# Patient Record
Sex: Male | Born: 1960 | Race: Black or African American | Hispanic: No | Marital: Single | Smoking: Former smoker
Health system: Southern US, Community
[De-identification: ages and names within clinical notes are randomized; demographics above are authoritative.]

## PROBLEM LIST (undated history)

## (undated) DIAGNOSIS — E119 Type 2 diabetes mellitus without complications: Secondary | ICD-10-CM

## (undated) DIAGNOSIS — C189 Malignant neoplasm of colon, unspecified: Secondary | ICD-10-CM

## (undated) DIAGNOSIS — K219 Gastro-esophageal reflux disease without esophagitis: Secondary | ICD-10-CM

## (undated) DIAGNOSIS — C801 Malignant (primary) neoplasm, unspecified: Secondary | ICD-10-CM

## (undated) DIAGNOSIS — I10 Essential (primary) hypertension: Secondary | ICD-10-CM

## (undated) HISTORY — PX: COLONOSCOPY: SHX174

## (undated) HISTORY — DX: Essential (primary) hypertension: I10

## (undated) HISTORY — DX: Type 2 diabetes mellitus without complications: E11.9

---

## 2014-03-04 ENCOUNTER — Emergency Department (HOSPITAL_COMMUNITY): Payer: Self-pay

## 2014-03-04 ENCOUNTER — Encounter (HOSPITAL_COMMUNITY): Payer: Self-pay | Admitting: Emergency Medicine

## 2014-03-04 ENCOUNTER — Emergency Department (HOSPITAL_COMMUNITY)
Admission: EM | Admit: 2014-03-04 | Discharge: 2014-03-04 | Disposition: A | Discharge status: Home / Self Care | Payer: Self-pay | Attending: Emergency Medicine | Admitting: Emergency Medicine

## 2014-03-04 DIAGNOSIS — F17200 Nicotine dependence, unspecified, uncomplicated: Secondary | ICD-10-CM | POA: Insufficient documentation

## 2014-03-04 DIAGNOSIS — K047 Periapical abscess without sinus: Secondary | ICD-10-CM | POA: Insufficient documentation

## 2014-03-04 LAB — I-STAT CHEM 8, ED
BUN: 11 mg/dL (ref 6–23)
CALCIUM ION: 1.15 mmol/L (ref 1.12–1.23)
CHLORIDE: 103 meq/L (ref 96–112)
Creatinine, Ser: 1.2 mg/dL (ref 0.50–1.35)
GLUCOSE: 123 mg/dL — AB (ref 70–99)
HCT: 48 % (ref 39.0–52.0)
Hemoglobin: 16.3 g/dL (ref 13.0–17.0)
Potassium: 3.5 mEq/L — ABNORMAL LOW (ref 3.7–5.3)
Sodium: 139 mEq/L (ref 137–147)
TCO2: 22 mmol/L (ref 0–100)

## 2014-03-04 LAB — CBC WITH DIFFERENTIAL/PLATELET
Basophils Absolute: 0 10*3/uL (ref 0.0–0.1)
Basophils Relative: 0 % (ref 0–1)
Eosinophils Absolute: 0.1 10*3/uL (ref 0.0–0.7)
Eosinophils Relative: 1 % (ref 0–5)
HEMATOCRIT: 42.2 % (ref 39.0–52.0)
Hemoglobin: 15.3 g/dL (ref 13.0–17.0)
LYMPHS PCT: 24 % (ref 12–46)
Lymphs Abs: 2.1 10*3/uL (ref 0.7–4.0)
MCH: 31.2 pg (ref 26.0–34.0)
MCHC: 36.3 g/dL — ABNORMAL HIGH (ref 30.0–36.0)
MCV: 86.1 fL (ref 78.0–100.0)
MONO ABS: 0.7 10*3/uL (ref 0.1–1.0)
Monocytes Relative: 8 % (ref 3–12)
NEUTROS ABS: 6 10*3/uL (ref 1.7–7.7)
Neutrophils Relative %: 67 % (ref 43–77)
Platelets: 165 10*3/uL (ref 150–400)
RBC: 4.9 MIL/uL (ref 4.22–5.81)
RDW: 12.8 % (ref 11.5–15.5)
WBC: 9 10*3/uL (ref 4.0–10.5)

## 2014-03-04 MED ORDER — CLINDAMYCIN HCL 150 MG PO CAPS: 300.0000 mg | ORAL_CAPSULE | Freq: Three times a day (TID) | ORAL | Status: DC

## 2014-03-04 MED ORDER — IOHEXOL 300 MG/ML  SOLN
100.0000 mL | Freq: Once | INTRAMUSCULAR | Status: AC | PRN
  Administered 2014-03-04: 100 mL via INTRAVENOUS

## 2014-03-04 MED ORDER — MORPHINE SULFATE 4 MG/ML IJ SOLN
4.0000 mg | Freq: Once | INTRAMUSCULAR | Status: AC
  Administered 2014-03-04: 4 mg via INTRAVENOUS
  Filled 2014-03-04: qty 1

## 2014-03-04 MED ORDER — CLINDAMYCIN PHOSPHATE 300 MG/50ML IV SOLN
300.0000 mg | Freq: Once | INTRAVENOUS | Status: AC
  Administered 2014-03-04: 300 mg via INTRAVENOUS
  Filled 2014-03-04: qty 50

## 2014-03-04 MED ORDER — ONDANSETRON HCL 4 MG/2ML IJ SOLN
4.0000 mg | Freq: Once | INTRAMUSCULAR | Status: AC
  Administered 2014-03-04: 4 mg via INTRAVENOUS
  Filled 2014-03-04: qty 2

## 2014-03-04 MED ORDER — HYDROMORPHONE HCL PF 1 MG/ML IJ SOLN
1.0000 mg | Freq: Once | INTRAMUSCULAR | Status: AC
Start: 2014-03-04 — End: 2014-03-04
  Administered 2014-03-04: 1 mg via INTRAVENOUS
  Filled 2014-03-04: qty 1

## 2014-03-04 NOTE — Discharge Instructions (Signed)
°  Dental Abscess °A dental abscess is a collection of infected fluid (pus) from a bacterial infection in the inner part of the tooth (pulp). It usually occurs at the end of the tooth's root.  °CAUSES  °· Severe tooth decay. °· Trauma to the tooth that allows bacteria to enter into the pulp, such as a broken or chipped tooth. °SYMPTOMS  °· Severe pain in and around the infected tooth. °· Swelling and redness around the abscessed tooth or in the mouth or face. °· Tenderness. °· Pus drainage. °· Bad breath. °· Bitter taste in the mouth. °· Difficulty swallowing. °· Difficulty opening the mouth. °· Nausea. °· Vomiting. °· Chills. °· Swollen neck glands. °DIAGNOSIS  °· A medical and dental history will be taken. °· An examination will be performed by tapping on the abscessed tooth. °· X-rays may be taken of the tooth to identify the abscess. °TREATMENT °The goal of treatment is to eliminate the infection. You may be prescribed antibiotic medicine to stop the infection from spreading. A root canal may be performed to save the tooth. If the tooth cannot be saved, it may be pulled (extracted) and the abscess may be drained.  °HOME CARE INSTRUCTIONS °· Only take over-the-counter or prescription medicines for pain, fever, or discomfort as directed by your caregiver. °· Rinse your mouth (gargle) often with salt water (¼ tsp salt in 8 oz [250 ml] of warm water) to relieve pain or swelling. °· Do not drive after taking pain medicine (narcotics). °· Do not apply heat to the outside of your face. °· Return to your dentist for further treatment as directed. °SEEK MEDICAL CARE IF: °· Your pain is not helped by medicine. °· Your pain is getting worse instead of better. °SEEK IMMEDIATE MEDICAL CARE IF: °· You have a fever or persistent symptoms for more than 2 3 days. °· You have a fever and your symptoms suddenly get worse. °· You have chills or a very bad headache. °· You have problems breathing or swallowing. °· You have trouble  opening your mouth. °· You have swelling in the neck or around the eye. °Document Released: 10/18/2005 Document Revised: 07/12/2012 Document Reviewed: 01/26/2011 °[employer]® Patient Information ©2014 [employer]. ° ° °

## 2014-03-04 NOTE — ED Provider Notes (Signed)
CSN: [id number]     Arrival date & time 03/04/14  1439 History  This chart was scribed for non-physician practitioner, Junious SilkHannah , PA-C working with Shon Batonourtney F Horton, MD by Greggory StallionKayla Andersen, ED scribe. This patient was seen in room WTR7/WTR7 and the patient's care was started at 3:50 PM.   Chief Complaint  Patient presents with  . Dental Pain  . Facial Swelling   The history is provided by the patient. No language interpreter was used.   HPI Comments: Duane [name] is a 53 y.o. male who presents to the Emergency Department complaining of gradual onset, left lower dental pain that started 2 days ago. The pain is aching in nature. Left sided facial swelling started this morning. He states he has not noticed any drainage from the area. Pt has done warm compresses with no relief. Denies fever, chills, trouble swallowing, difficulty breathing. Denies history of diabetes.   History reviewed. No pertinent past medical history. History reviewed. No pertinent past surgical history. No family history on file. History  Substance Use Topics  . Smoking status: Current Every Day Smoker  . Smokeless tobacco: Never Used  . Alcohol Use: Yes     Comment: 3x/week    Review of Systems  Constitutional: Negative for fever and chills.  HENT: Positive for dental problem and facial swelling. Negative for trouble swallowing.   All other systems reviewed and are negative.  Allergies  Codeine  Home Medications   Prior to Admission medications   Not on File   BP 154/87  Pulse 62  Temp(Src) 98.2 F (36.8 C) (Oral)  Resp 16  SpO2 100%  Physical Exam  Nursing note and vitals reviewed. Constitutional: He is oriented to person, place, and time. He appears well-developed and well-nourished. No distress.  HENT:  Head: Normocephalic and atraumatic.  Right Ear: External ear normal.  Left Ear: External ear normal.  Nose: Nose normal.  No trismus or submental edema. 3 cm area of induration to left  lower cheek. No overlying cellulitis. No active drainage.   Eyes: Conjunctivae are normal.  Neck: Normal range of motion. No tracheal deviation present.  Cardiovascular: Normal rate, regular rhythm and normal heart sounds.   Pulmonary/Chest: Effort normal and breath sounds normal. No stridor.  Abdominal: Soft. He exhibits no distension. There is no tenderness.  Musculoskeletal: Normal range of motion.  Neurological: He is alert and oriented to person, place, and time.  Skin: Skin is warm and dry. He is not diaphoretic.  Psychiatric: He has a normal mood and affect. His behavior is normal.    ED Course  Procedures (including critical care time)  DIAGNOSTIC STUDIES: Oxygen Saturation is 99% on RA, normal by my interpretation.    COORDINATION OF CARE: 3:51 PM-Discussed treatment plan which includes speaking with Dr. Wilkie AyeHorton with pt at bedside and pt agreed to plan.   3:53 PM-Dr. Wilkie AyeHorton saw and evaluated pt. Advised to do CT scan of pt.  Labs Review Labs Reviewed  CBC WITH DIFFERENTIAL - Abnormal; Notable for the following:    MCHC 36.3 (*)    All other components within normal limits  I-STAT CHEM 8, ED - Abnormal; Notable for the following:    Potassium 3.5 (*)    Glucose, Bld 123 (*)    All other components within normal limits    Imaging Review Ct Soft Tissue Neck W Contrast  03/04/2014   CLINICAL DATA:  Left-sided drop pain and swelling. No fever or drainage.  EXAM: CT NECK WITH  CONTRAST  TECHNIQUE: Multidetector CT imaging of the neck was performed using the standard protocol following the bolus administration of intravenous contrast.  CONTRAST:  100mL OMNIPAQUE IOHEXOL 300 MG/ML  SOLN  COMPARISON:  None.  FINDINGS: A prominent dental caries is evident within the first residual molar in the left mandible. There is significant periapical lucency with lateral cortical destruction. A subperiosteal abscess measures 8 x 17 mm. There stranding into the adjacent soft tissues and platysma  muscle with additional low-density areas measuring up to 15 x 19 mm. Asymmetric enlarged left level 1B lymph nodes are present. The jugulodigastric lymph nodes are prominent on the left.  Salivary glands are within normal limits. Calcifications are present with concretions of the palatine tonsils.  No focal mucosal or submucosal lesions are present. The thyroid gland is within normal limits.  The lung apices are clear.  IMPRESSION: 1. Dental caries and periapical disease with lateral cortical breakthrough in the left mandible compatible with odontogenic abscess. 2. Subperiosteal abscess along the left mandible. 3. Extensive left facial swelling with additional areas of hypoattenuation measuring up to 15 x 19 mm, concerning for an adjacent abscess. 4. Reactive left-sided cervical adenopathy.   Electronically Signed   By: Gennette Pachris  Mattern M.D.   On: 03/04/2014 17:24     EKG Interpretation None      MDM   Final diagnoses:  Dental abscess   Patient presents to ED with odontogenic abscess, subperiosteal abscess, and additional abscess in jaw as seen by CT scan. Patient is maintaining secretions well and speaking in full sentences. No trismus and patient is afebrile. WBC count is 9.0. Discussed this case with Dr. Russella DarBenitez who recommends IV clindamycin and then follow up in office tomorrow for appropriate drainage. I discussed this with the patient who agrees with plan. He agrees to see Dr. Russella DarBenitez tomorrow. Discharged home with rx for clindamycin and pain medication. Discussed reasons to return to the ED immediately. Vital signs stable for discharge. Dr. Wilkie AyeHorton evaluated the patient and agrees with plan. Patient / Family / Caregiver informed of clinical course, understand medical decision-making process, and agree with plan.   I personally performed the services described in this documentation, which was scribed in my presence. The recorded information has been reviewed and is accurate.  Mora BellmanHannah S ,  PA-C 03/05/14 1620

## 2014-03-04 NOTE — ED Notes (Addendum)
Pt A+Ox4, reports lower dental pain x2 days, this AM awoke with swelling to jaw.  Significant swelling noted at this time to L lower cheek/mandible area.  Pt reports 8/10 pain.  Pt speaking full/clear sentences, rr even/un-lab.  No difficulty clearing secretions or maintaining airway.  Pt reports similar episode x2 months ago "and it drained on it's own".  Pt denies fevers/chills or other complaints.  Skin otherwise PWD.  Ambulatory with steady gait.  Pt reports taking ibuprofen around 1000 today with mild relief of pain.  NAD.

## 2014-03-06 NOTE — ED Provider Notes (Signed)
Medical screening examination/treatment/procedure(s) were conducted as a shared visit with non-physician practitioner(s) and myself.  I personally evaluated the patient during the encounter.   EKG Interpretation None     Patient presents with left lower dental pain and swelling.  Swelling over the left lower mandible and no obvious periapical abscess.  No overlying cellulitis.  Afebrile.  CT with multiple abscesses including periapically and adjacent to the mandible involving the cortex.  GIven clindamycin.  ENT consulted.   Shon Batonourtney F , MD 03/06/14 [phone]

## 2015-08-22 IMAGING — CT CT NECK W/ CM
3 of 4 series · 13 of 27 positions shown, 16 images · IV contrast (OMNIPAQUE 300)
Comparison: None.

CLINICAL DATA: Left-sided drop pain and swelling. No fever or
drainage.

EXAM:
CT NECK WITH CONTRAST
TECHNIQUE: Multidetector CT imaging of the neck was performed using the
standard protocol following the bolus administration of intravenous
contrast.
CONTRAST:  100mL OMNIPAQUE IOHEXOL 300 MG/ML  SOLN

[Series 2: neck with st · axial · 0.43mm/px · z∈[-212,-96]mm · 3 of 117 slices shown]
[im 30/117  bone]
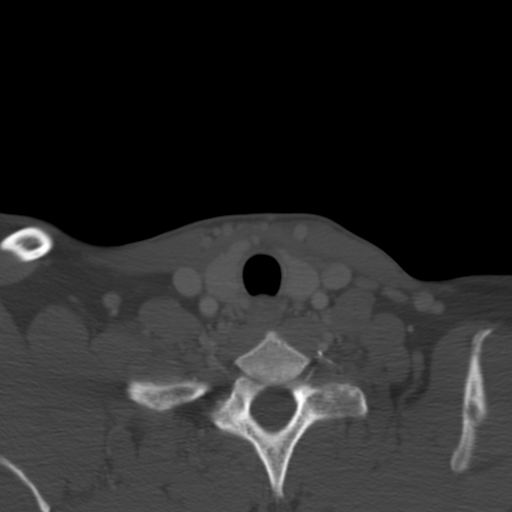
[im 59/117  bone]
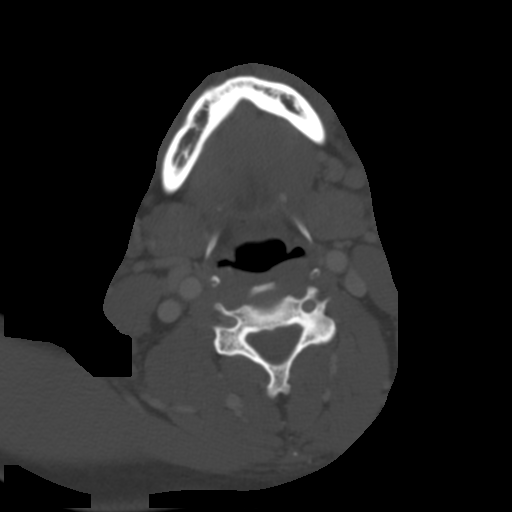
[im 88/117  bone]
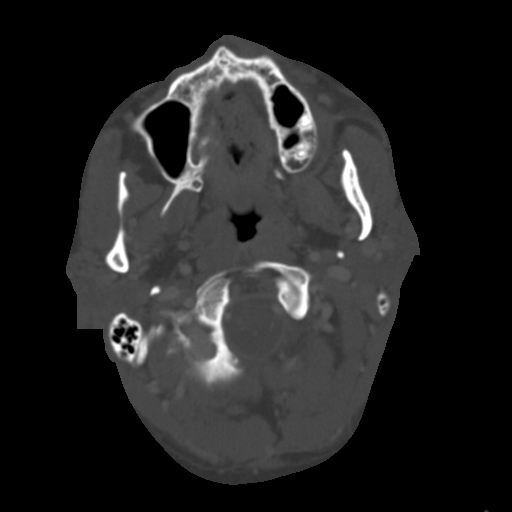

[Series 7: axial recons · axial · 0.39mm/px · z∈[-289,-104]mm · 5 of 150 slices shown, 7 images]
[im 25/150  soft-tissue]
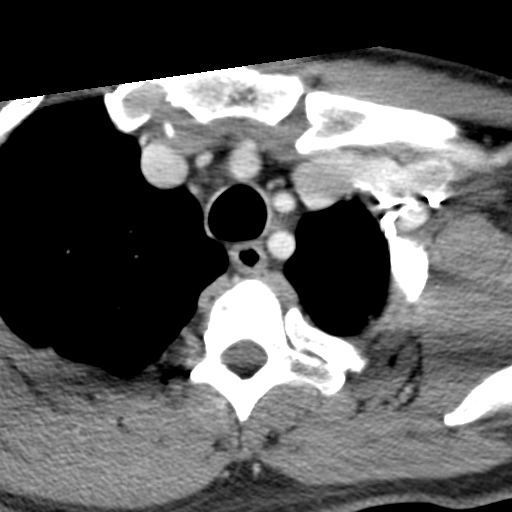
[im 25/150  bone]
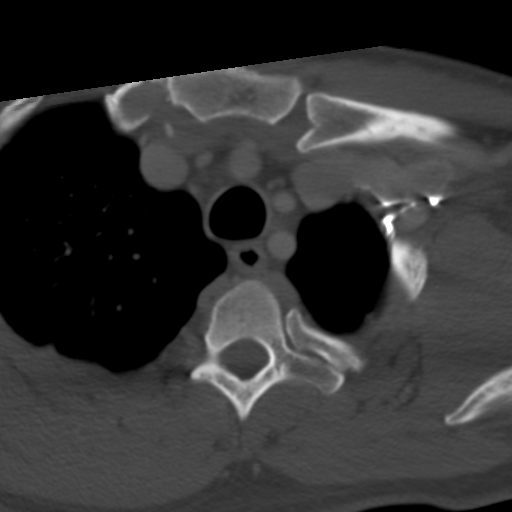
[im 50/150  bone]
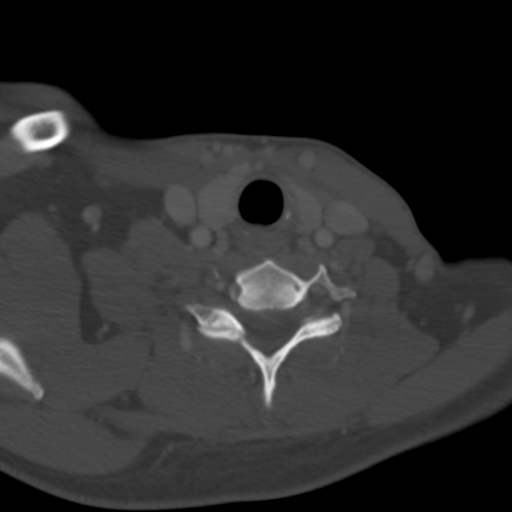
[im 75/150  bone]
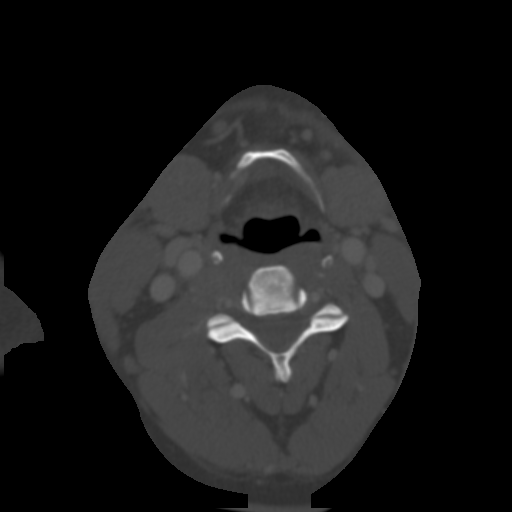
[im 100/150  bone]
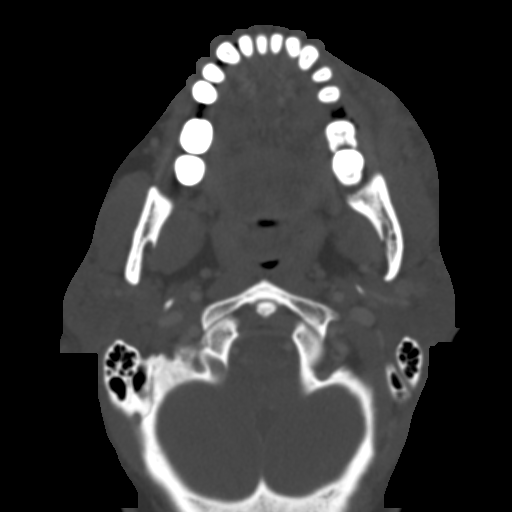
[im 125/150  soft-tissue]
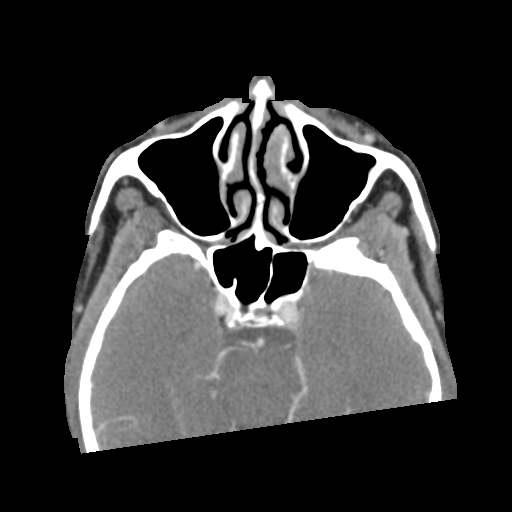
[im 125/150  bone]
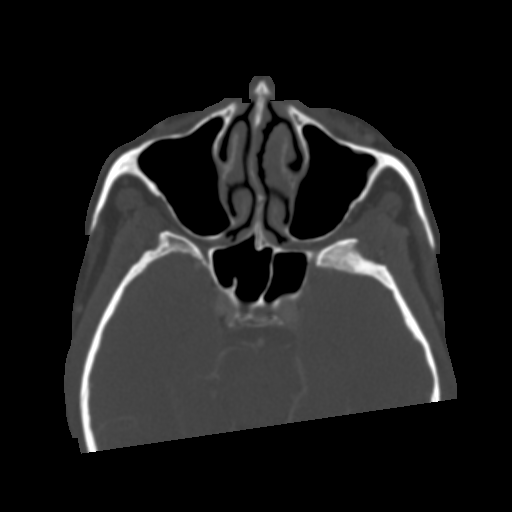

[Series 602: <mpr thick range> · sagittal · 0.46mm/px · 5 of 83 slices shown, 6 images]
[im 28/83  bone]
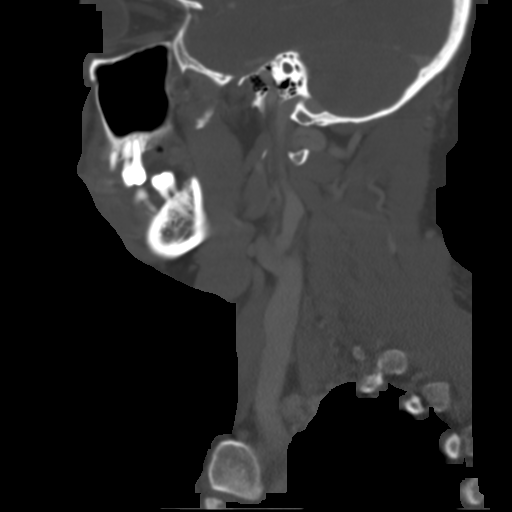
[im 35/83  bone]
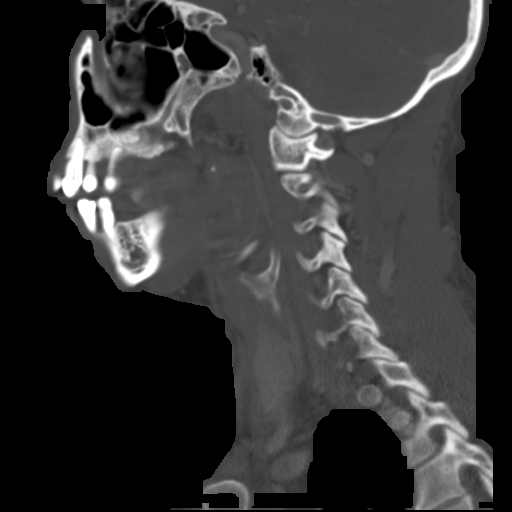
[im 42/83  soft-tissue]
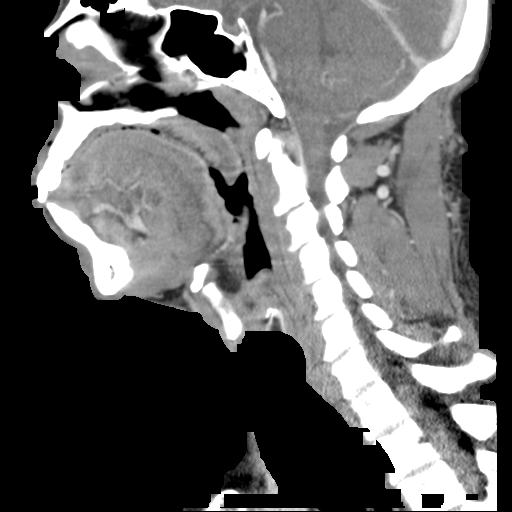
[im 42/83  bone]
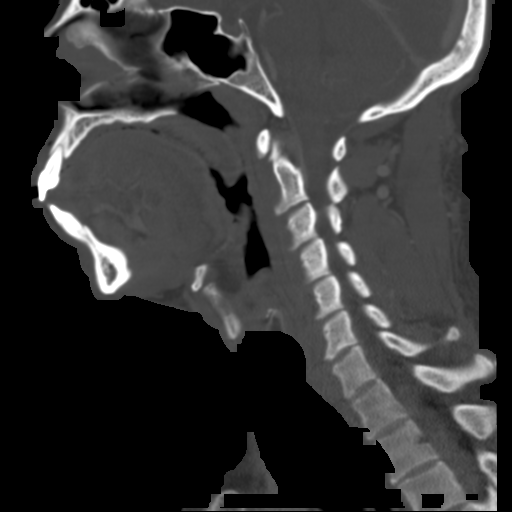
[im 48/83  bone]
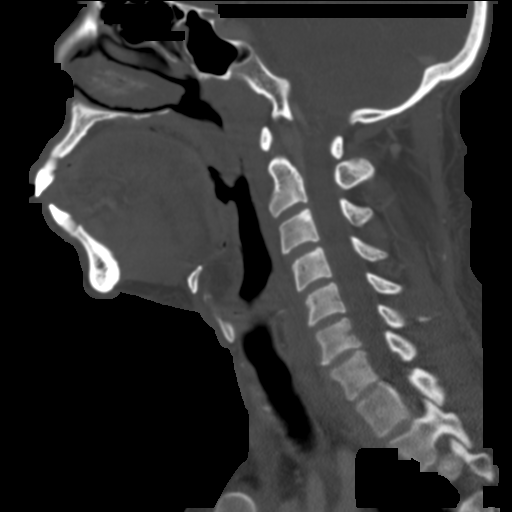
[im 55/83  bone]
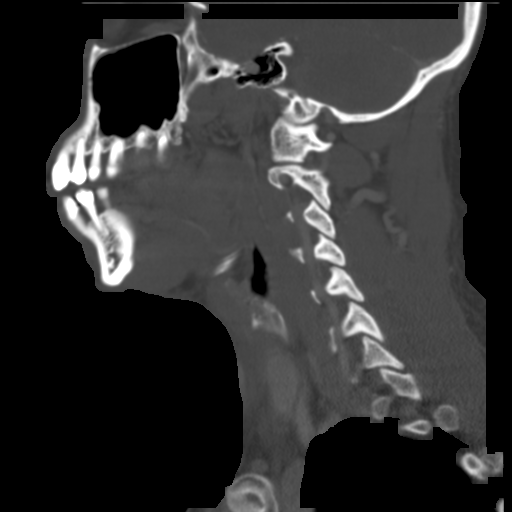

[13 of 27 positions shown; findings below may reference images not displayed]

FINDINGS: A prominent dental caries is evident within the first residual molar
in the left mandible. There is significant periapical lucency with
lateral cortical destruction. A subperiosteal abscess measures 8 x
17 mm. There stranding into the adjacent soft tissues and platysma
muscle with additional low-density areas measuring up to 15 x 19 mm.
Asymmetric enlarged left level 1B lymph nodes are present. The
jugulodigastric lymph nodes are prominent on the left.

Salivary glands are within normal limits. Calcifications are present
with concretions of the palatine tonsils.

No focal mucosal or submucosal lesions are present. The thyroid
gland is within normal limits.

The lung apices are clear.
IMPRESSION: 1. Dental caries and periapical disease with lateral cortical
breakthrough in the left mandible compatible with odontogenic
abscess.
2. Subperiosteal abscess along the left mandible.
3. Extensive left facial swelling with additional areas of
hypoattenuation measuring up to 15 x 19 mm, concerning for an
adjacent abscess.
4. Reactive left-sided cervical adenopathy.

## 2020-02-22 ENCOUNTER — Ambulatory Visit: Payer: Self-pay | Attending: Internal Medicine

## 2020-02-22 DIAGNOSIS — Z23 Encounter for immunization: Secondary | ICD-10-CM

## 2020-02-22 NOTE — Progress Notes (Signed)
   Covid-19 Vaccination Clinic  Name:  Duane [name]    MRN: [id number] DOB: [birth year]  02/22/2020  [name] was observed post Covid-19 immunization for 15 minutes without incident. He was provided with Vaccine Information Sheet and instruction to access the V-Safe system.   [name] was instructed to call 911 with any severe reactions post vaccine: Marland Kitchen Difficulty breathing  . Swelling of face and throat  . A fast heartbeat  . A bad rash all over body  . Dizziness and weakness   Immunizations Administered    Name Date Dose VIS Date Route   Pfizer COVID-19 Vaccine 02/22/2020  2:25 PM 0.3 mL 12/26/2018 Intramuscular   Manufacturer: [employer], [employer]   Lot: [id number]   NDC: [address]-0

## 2020-03-17 ENCOUNTER — Ambulatory Visit: Payer: Self-pay | Attending: Internal Medicine

## 2020-03-17 DIAGNOSIS — Z23 Encounter for immunization: Secondary | ICD-10-CM

## 2020-03-17 NOTE — Progress Notes (Signed)
   Covid-19 Vaccination Clinic  Name:  Duane [name]    MRN: [id number] DOB: 21-Jul-1961  03/17/2020  [name] was observed post Covid-19 immunization for 15 minutes without incident. He was provided with Vaccine Information Sheet and instruction to access the V-Safe system.   [name] was instructed to call 911 with any severe reactions post vaccine: Marland Kitchen Difficulty breathing  . Swelling of face and throat  . A fast heartbeat  . A bad rash all over body  . Dizziness and weakness   Immunizations Administered    Name Date Dose VIS Date Route   Pfizer COVID-19 Vaccine 03/17/2020  2:20 PM 0.3 mL 12/26/2018 Intramuscular   Manufacturer: [employer], [employer]   Lot: [id number]   NDC: [address]-4

## 2021-12-19 ENCOUNTER — Other Ambulatory Visit: Payer: Self-pay

## 2021-12-19 ENCOUNTER — Emergency Department (HOSPITAL_COMMUNITY)
Admission: EM | Admit: 2021-12-19 | Discharge: 2021-12-19 | Disposition: A | Discharge status: Home / Self Care | Payer: Self-pay | Attending: Emergency Medicine | Admitting: Emergency Medicine

## 2021-12-19 ENCOUNTER — Encounter (HOSPITAL_COMMUNITY): Payer: Self-pay

## 2021-12-19 DIAGNOSIS — I10 Essential (primary) hypertension: Secondary | ICD-10-CM | POA: Insufficient documentation

## 2021-12-19 LAB — CBC WITH DIFFERENTIAL/PLATELET
Abs Immature Granulocytes: 0.03 10*3/uL (ref 0.00–0.07)
Basophils Absolute: 0 10*3/uL (ref 0.0–0.1)
Basophils Relative: 0 %
Eosinophils Absolute: 0.1 10*3/uL (ref 0.0–0.5)
Eosinophils Relative: 1 %
HCT: 43 % (ref 39.0–52.0)
Hemoglobin: 15.1 g/dL (ref 13.0–17.0)
Immature Granulocytes: 0 %
Lymphocytes Relative: 21 %
Lymphs Abs: 2 10*3/uL (ref 0.7–4.0)
MCH: 30 pg (ref 26.0–34.0)
MCHC: 35.1 g/dL (ref 30.0–36.0)
MCV: 85.5 fL (ref 80.0–100.0)
Monocytes Absolute: 0.6 10*3/uL (ref 0.1–1.0)
Monocytes Relative: 6 %
Neutro Abs: 6.8 10*3/uL (ref 1.7–7.7)
Neutrophils Relative %: 72 %
Platelets: 210 10*3/uL (ref 150–400)
RBC: 5.03 MIL/uL (ref 4.22–5.81)
RDW: 12.9 % (ref 11.5–15.5)
WBC: 9.6 10*3/uL (ref 4.0–10.5)
nRBC: 0 % (ref 0.0–0.2)

## 2021-12-19 LAB — COMPREHENSIVE METABOLIC PANEL
ALT: 18 U/L (ref 0–44)
AST: 18 U/L (ref 15–41)
Albumin: 3.8 g/dL (ref 3.5–5.0)
Alkaline Phosphatase: 61 U/L (ref 38–126)
Anion gap: 8 (ref 5–15)
BUN: 6 mg/dL (ref 6–20)
CO2: 27 mmol/L (ref 22–32)
Calcium: 9.7 mg/dL (ref 8.9–10.3)
Chloride: 104 mmol/L (ref 98–111)
Creatinine, Ser: 1.23 mg/dL (ref 0.61–1.24)
GFR, Estimated: >60 mL/min (ref >60)
Glucose, Bld: 114 mg/dL — ABNORMAL HIGH (ref 70–99)
Potassium: 4.1 mmol/L (ref 3.5–5.1)
Sodium: 139 mmol/L (ref 135–145)
Total Bilirubin: 0.8 mg/dL (ref 0.3–1.2)
Total Protein: 7.1 g/dL (ref 6.5–8.1)

## 2021-12-19 LAB — URINALYSIS, ROUTINE W REFLEX MICROSCOPIC
Bilirubin Urine: NEGATIVE
Glucose, UA: NEGATIVE mg/dL
Hgb urine dipstick: NEGATIVE
Ketones, ur: NEGATIVE mg/dL
Leukocytes,Ua: NEGATIVE
Nitrite: NEGATIVE
Protein, ur: NEGATIVE mg/dL
Specific Gravity, Urine: 1.011 (ref 1.005–1.030)
pH: 5 (ref 5.0–8.0)

## 2021-12-19 MED ORDER — AMLODIPINE BESYLATE 5 MG PO TABS: 5.0000 mg | ORAL_TABLET | Freq: Every day | ORAL | 0 refills | Status: DC

## 2021-12-19 NOTE — ED Provider Notes (Signed)
Emergency Department Provider Note   I have reviewed the triage vital signs and the nursing notes.   HISTORY  Chief Complaint Hypertension   HPI Duane [name] is a 61 y.o. male with no prior history of hypertension presents to the emergency department for evaluation of intermittent headache, blurry vision, elevated blood pressures.  He states has had several weeks of intermittent symptoms with no clear provoking factors.  No numbness or weakness.  No chest discomfort.  No heart palpitations.  No fevers or chills.  He checked his blood pressures at home and noted elevated readings with diastolic pressures in the 100 range.  History reviewed. No pertinent past medical history.  Review of Systems  Constitutional: No fever/chills Cardiovascular: Denies chest pain. Respiratory: Denies shortness of breath. Gastrointestinal: No abdominal pain.   Genitourinary: Negative for dysuria. Musculoskeletal: Negative for back pain. Skin: Negative for rash. Neurological: Negative for focal weakness or numbness. Positive HA.    ____________________________________________   PHYSICAL EXAM:  VITAL SIGNS: ED Triage Vitals  Enc Vitals Group     BP 12/19/21 1724 (!) 157/99     Pulse Rate 12/19/21 1724 71     Resp 12/19/21 1724 18     Temp 12/19/21 1724 98.1 F (36.7 C)     Temp Source 12/19/21 1724 Oral     SpO2 12/19/21 1724 100 %     Weight 12/19/21 1749 230 lb (104.3 kg)     Height 12/19/21 1749 6\' 2"  (1.88 m)   Constitutional: Alert and oriented. Well appearing and in no acute distress. Eyes: Conjunctivae are normal. PERRL. EOMI. Head: Atraumatic. Nose: No congestion/rhinnorhea. Mouth/Throat: Mucous membranes are moist.   Neck: No stridor.   Cardiovascular: Normal rate, regular rhythm. Good peripheral circulation. Grossly normal heart sounds.   Respiratory: Normal respiratory effort.  No retractions. Lungs CTAB. Gastrointestinal: Soft and nontender. No distention.   Musculoskeletal: No lower extremity tenderness nor edema. No gross deformities of extremities. Neurologic:  Normal speech and language. No gross focal neurologic deficits are appreciated.  Skin:  Skin is warm, dry and intact. No rash noted.  ____________________________________________   LABS (all labs ordered are listed, but only abnormal results are displayed)  Labs Reviewed  COMPREHENSIVE METABOLIC PANEL - Abnormal; Notable for the following components:      Result Value   Glucose, Bld 114 (*)    All other components within normal limits  CBC WITH DIFFERENTIAL/PLATELET  URINALYSIS, ROUTINE W REFLEX MICROSCOPIC   ____________________________________________  EKG   EKG Interpretation  Date/Time:  Saturday December 19 2021 17:43:33 EST Ventricular Rate:  78 PR Interval:  148 QRS Duration: 86 QT Interval:  352 QTC Calculation: 401 R Axis:   38 Text Interpretation: Normal sinus rhythm Normal ECG No previous ECGs available Confirmed by 07-26-1997 [phone]) on 12/19/2021 6:08:38 PM        ____________________________________________  RADIOLOGY  No results found.  ____________________________________________   PROCEDURES  Procedure(s) performed:   Procedures   ____________________________________________   INITIAL IMPRESSION / ASSESSMENT AND PLAN / ED COURSE  Pertinent labs & imaging results that were available during my care of the patient were reviewed by me and considered in my medical decision making (see chart for details).   This patient is Presenting for Evaluation of elevated BP, which does require a range of treatment options, and is a complaint that involves a high risk of morbidity and mortality.  The Differential Diagnoses include essential HTN, CVA, SAH, SDH, ACS, metabolic derrangement.  I decided to review pertinent External Data, and in summary no prior history of HTN or medications for HTN.   Clinical Laboratory Tests Ordered, included  CBC which is within normal limits.  CMP shows creatinine of 1.23 with normal BUN.  Normal electrolytes.  Mild elevation in glucose although not fasting at 114.   Radiologic Tests: Considered CT imaging of the head but patient has no focal neurodeficits.  He has no active headache or blurry vision symptoms.  No chest pains symptoms to prompt chest imaging.  Cardiac Monitor Tracing which shows NSR.    Social Determinants of Health Risk patient is a smoker.  Discussed smoking as a contributing factor to hypertension.  Discussed cutting back/stopping smoking.    Medical Decision Making: Summary:  Patient presents to the emergency department for evaluation of intermittent headache with blurry vision although no active symptoms.  He has an intact neurologic exam including strength, coordination, sensation.  Lab work shows no evidence of acute hypertensive emergency.  Plan to start the patient on amlodipine.  We did discuss the need for smoking cessation.  We also discussed the need to establish care with a primary care doctor.  I will list one on the after visit summary for him to call for an appointment.   Reevaluation with update and discussion with patient. Plan to start medication here. Discussed ED return precautions.   Disposition: discharge  ____________________________________________  FINAL CLINICAL IMPRESSION(S) / ED DIAGNOSES  Final diagnoses:  Primary hypertension     NEW OUTPATIENT MEDICATIONS STARTED DURING THIS VISIT:  New Prescriptions   AMLODIPINE (NORVASC) 5 MG TABLET    Take 1 tablet (5 mg total) by mouth daily.    Note:  This document was prepared using Dragon voice recognition software and may include unintentional dictation errors.  Alona Bene, MD, [facility]    , Arlyss Repress, MD 12/19/21 2003

## 2021-12-19 NOTE — ED Notes (Signed)
Pt aware UA needed. 

## 2021-12-19 NOTE — ED Triage Notes (Signed)
Pt arrives POV for eval of HTN. Pt reports he noted that it was elevated about 1 week ago, and noted blurry vision intermittently x 2 weeks. States he does not have a hx of HTN and is on no medications. Pt reports he has been having diastolics over 100 at home. No complaints on arrival

## 2021-12-19 NOTE — Discharge Instructions (Signed)

## 2022-01-12 ENCOUNTER — Ambulatory Visit (INDEPENDENT_AMBULATORY_CARE_PROVIDER_SITE_OTHER): Payer: Self-pay | Admitting: Primary Care

## 2022-01-12 ENCOUNTER — Other Ambulatory Visit: Payer: Self-pay

## 2022-01-12 ENCOUNTER — Encounter (INDEPENDENT_AMBULATORY_CARE_PROVIDER_SITE_OTHER): Payer: Self-pay | Admitting: Primary Care

## 2022-01-12 VITALS — BP 139/83 | HR 69 | Temp 98.0°F | Ht 74.0 in | Wt 222.4 lb

## 2022-01-12 DIAGNOSIS — Z131 Encounter for screening for diabetes mellitus: Secondary | ICD-10-CM

## 2022-01-12 DIAGNOSIS — Z23 Encounter for immunization: Secondary | ICD-10-CM

## 2022-01-12 DIAGNOSIS — E119 Type 2 diabetes mellitus without complications: Secondary | ICD-10-CM

## 2022-01-12 DIAGNOSIS — I10 Essential (primary) hypertension: Secondary | ICD-10-CM

## 2022-01-12 DIAGNOSIS — Z09 Encounter for follow-up examination after completed treatment for conditions other than malignant neoplasm: Secondary | ICD-10-CM

## 2022-01-12 DIAGNOSIS — Z7689 Persons encountering health services in other specified circumstances: Secondary | ICD-10-CM

## 2022-01-12 DIAGNOSIS — Z7189 Other specified counseling: Secondary | ICD-10-CM

## 2022-01-12 LAB — POCT GLYCOSYLATED HEMOGLOBIN (HGB A1C): Hemoglobin A1C: 6.6 % — AB (ref 4.0–5.6)

## 2022-01-12 MED ORDER — AMLODIPINE BESYLATE 10 MG PO TABS: 10.0000 mg | ORAL_TABLET | Freq: Every day | ORAL | 1 refills | Status: DC

## 2022-01-12 MED ORDER — HYDROCHLOROTHIAZIDE 25 MG PO TABS: 25.0000 mg | ORAL_TABLET | Freq: Every day | ORAL | 1 refills | Status: DC

## 2022-01-12 NOTE — Progress Notes (Signed)
?[facility] ? ? ?Subjective:  ? [name] is a 61 y.o. male presents for emergency department follow up and establish care. On 12/19/21- presented to emergency department to the  for evaluation of intermittent headache, blurry vision, elevated blood pressures, patient was discharged with dx of Primary hypertension tx amlodipine 5mg  daily f/u with PCP. Today he denies No headache, No chest pain, No abdominal pain - No Nausea, No new weakness tingling or numbness, No Cough -shoortness of breath. ? ?No past medical history on file.  ? ?Allergies  ?Allergen Reactions  ? Codeine   ?  "I get jittery."   ? ? ?  ?Current Outpatient Medications on File Prior to Visit  ?Medication Sig Dispense Refill  ? amLODipine (NORVASC) 5 MG tablet Take 1 tablet (5 mg total) by mouth daily. 30 tablet 0  ? ?No current facility-administered medications on file prior to visit.  ? ?Review of System: ?Comprehensive ROS Pertinent positive and negative noted in HPI   ? ?Objective:  ?BP (!) 155/84 (BP Location: Right Arm, Patient Position: Sitting, Cuff Size: Large)   Pulse 68   Temp 98 ?F (36.7 ?C) (Oral)   Ht 6\' 2"  (1.88 m)   Wt 222 lb 6.4 oz (100.9 kg)   SpO2 99%   BMI 28.55 kg/m?  ? Weights  ? 01/12/22 [name]  ?Weight: 222 lb 6.4 oz (100.9 kg)  ? ? ?Physical Exam: ?General Appearance: Well nourished, in no apparent distress. ?Eyes: PERRLA, EOMs, conjunctiva no swelling or erythema ?Sinuses: No Frontal/maxillary tenderness ?ENT/Mouth: Ext aud canals clear, TMs without erythema, bulging. Hearing normal.  ?Neck: Supple, thyroid normal.  ?Respiratory: Respiratory effort normal, BS equal bilaterally without rales, rhonchi, wheezing or stridor.  ?Cardio: RRR with no MRGs. Brisk peripheral pulses without edema.  ?Abdomen: Soft, + BS.  Non tender, no guarding, rebound, hernias, masses. ?Lymphatics: Non tender without lymphadenopathy.  ?Musculoskeletal: Full ROM, 5/5 strength, normal gait.  ?Skin: Warm, dry without  rashes, lesions, ecchymosis.  ?Neuro: Cranial nerves intact. Normal muscle tone, no cerebellar symptoms. Sensation intact.  ?Psych: Awake and oriented X 3, normal affect, Insight and Judgment appropriate.  ? ? ?Assessment:  ?Duane [name] was seen today for hospitalization follow-up. ? ?Diagnoses and all orders for this visit: ? ?Screening for diabetes mellitus ?-     HgB A1c 6.6 ? ?Need for Tdap vaccination ?-     Tdap vaccine greater than or equal to 7yo IM ? ?Hospital discharge follow-up ?Retrieved from d/c ?Follow-Ups: Call [facility] in 1 day (12/20/2021); to establish care wtih PCP ? ?Encounter to establish care ?Establish care  ? ?Essential hypertension ?BP goal - < 130/80 ?Explained that having normal blood pressure is the goal and medications are helping to get to goal and maintain normal blood pressure. ?DIET: Limit salt intake, read nutrition labels to check salt content, limit fried and high fatty foods  ?Avoid using multisymptom OTC cold preparations that generally contain sudafed which can rise BP. Consult with pharmacist on best cold relief products to use for persons with HTN ?EXERCISE ?Discussed incorporating exercise such as walking - 30 minutes most days of the week and can do in 10 minute intervals    ?-     amLODipine (NORVASC) 10 MG tablet; Take 1 tablet (10 mg total) by mouth daily. ?-     hydrochlorothiazide (HYDRODIURIL) 25 MG tablet; Take 1 tablet (25 mg total) by mouth daily. ? ?Need for immunization against influenza ?-  Flu Vaccine QUAD 61mo+IM (Fluarix, Fluzone & Alfiuria Quad PF) ? ? Type 2 diabetes mellitus without complication, without long-term current use of insulin (HCC) ?New dx with start with life style modification and exercising for 3 months if A1C has not dropped then he is receptacle to medication. ? Encounter for diabetes education ?Your A1C is a measure of your sugar over the past 3 months and is not affected by what you have eaten over the past  few days. Diabetes increases your chances of stroke and heart attack over 300 % and is the leading cause of blindness and kidney failure in the [place]. Please make sure you decrease bad carbs like white bread, white rice, potatoes, corn, soft drinks, pasta, cereals, refined sugars, sweet tea, dried fruits, and fruit juice. Good carbs are okay to eat in moderation like sweet potatoes, brown rice, whole grain pasta/bread, most fruit (except dried fruit) and you can eat as many veggies as you want.  ? ?Greater than 6.5 is considered diabetic. ?Between 6.4 and 5.7 is prediabetic ?If your A1C is less than 5.7 you are NOT diabetic. ? ?Targets for Glucose Readings: ?Time of Check Target for patients WITHOUT Diabetes Target for DIABETICS  ?Before Meals Less than 100  less than 150  ?Two hours after meals Less than 200  Less than 250  ?  ?This note has been created with Education officer, environmental. Any transcriptional errors are unintentional.  ? ?[name], NP ?01/12/2022, 9:44 AM ?  ? ?

## 2022-01-12 NOTE — Patient Instructions (Addendum)
?Preventing Type 2 Diabetes Mellitus ?Type 2 diabetes, also called type 2 diabetes mellitus, is a long-term (chronic) disease that affects sugar (glucose) levels in your blood. Normally, a hormone called insulin allows glucose to enter cells in your body. The cells use glucose for energy. With type 2 diabetes, you will have one or both of these problems: ?Your pancreas does not make enough insulin. ?Cells in your body do not respond properly to insulin that your body makes (insulin resistance). ?Insulin resistance or lack of insulin causes extra glucose to build up in the blood instead of going into cells. As a result, high blood glucose (hyperglycemia) develops. That can cause many complications. Being overweight or obese and having an inactive (sedentary) lifestyle can increase your risk for diabetes. Type 2 diabetes can be delayed or prevented by making certain nutrition and lifestyle changes. ?How can this condition affect me? ?If you do not take steps to prevent diabetes, your blood glucose levels may keep increasing over time. Too much glucose in your blood for a long time can damage your blood vessels, heart, kidneys, nerves, and eyes. ?Type 2 diabetes can lead to chronic health problems and complications, such as: ?Heart disease. ?Stroke. ?Blindness. ?Kidney disease. ?Depression. ?Poor circulation in your feet and legs. In severe cases, a foot or leg may need to be surgically removed (amputated). ?What can increase my risk? ?You may be more likely to develop type 2 diabetes if you: ?Have type 2 diabetes in your family. ?Are overweight or obese. ?Have a sedentary lifestyle. ?Have insulin resistance or a history of prediabetes. ?Have a history of pregnancy-related (gestational) diabetes or polycystic ovary syndrome (PCOS). ?What actions can I take to prevent this? ?It can be difficult to recognize signs of type 2 diabetes. Taking action to prevent the disease before you develop symptoms is the best way to  avoid possible damage to your body. Making certain nutrition and lifestyle changes may prevent or delay the disease and related health problems. ?Nutrition ? ?Eat healthy meals and snacks regularly. Do not skip meals. Fruit or a handful of nuts is a healthy snack between meals. ?Drink water throughout the day. Avoid drinks that contain added sugar, such as soda or sweetened tea. Drink enough fluid to keep your urine pale yellow. ?Follow instructions from your health care provider about eating or drinking restrictions. ?Limit the amount of food you eat by: ?Managing how much you eat at a time (portion size). ?Checking food labels for the serving sizes of food. ?Using a kitchen scale to weigh amounts of food. ?Saut? or steam food instead of frying it. Cook with water or broth instead of oils or butter. ?Limit saturated fat and salt (sodium) in your diet. Have no more than 1 tsp (2,400 mg) of sodium a day. If you have heart disease or high blood pressure, use less than ??? tsp (1,500 mg) of sodium a day. ?Lifestyle ? ?Lose weight if needed and as told. Your health care provider can determine how much weight loss is best for you and can help you lose weight safely. ?If you are overweight or obese, you may be told to lose at least 5?7% of your body weight. ?Manage blood pressure, cholesterol, and stress. Your health care provider will help determine the best treatment for you. ?Do not use any products that contain nicotine or tobacco. These products include cigarettes, chewing tobacco, and vaping devices, such as e-cigarettes. If you need help quitting, ask your health care provider. ?Activity ? ?Do physical  activity that makes your heart beat faster and makes you sweat (moderate intensity). Do this for at least 30 minutes on at least 5 days of the week, or as much as told by your health care provider. ?Ask your health care provider what activities are safe for you. A mix of activities may be best, such as walking,  swimming, cycling, and strength training. ?Try to add physical activity into your day. For example: ?Park your car farther away than usual so that you walk more. ?Take a walk during your lunch break. ?Use stairs instead of elevators or escalators. ?Walk or bike to work instead of driving. ?Alcohol use ?If you drink alcohol: ?Limit how much you have to: ?0?1 drink a day for women who are not pregnant. ?0?2 drinks a day for men. ?Know how much alcohol is in your drink. In the [place] one drink equals one 12 oz bottle of beer (355 mL), one 5 oz glass of wine (148 mL), or one 1? oz glass of hard liquor (44 mL). ?General information ?Talk with your health care provider about your risk factors and how you can reduce your risk for diabetes. ?Have your blood glucose tested regularly, as told by your health care provider. ?Get screening tests as told by your health care provider. You may have these regularly, especially if you have certain risk factors for type 2 diabetes. ?Make an appointment with a registered dietitian. This diet and nutrition specialist can help you make a healthy eating plan and help you understand portion sizes and food labels. ?Where to find support ?Ask your health care provider to recommend a registered dietitian, a certified diabetes care and education specialist, or a weight loss program. ?Look for local or online weight loss groups. ?Join a gym, fitness club, or outdoor activity group, such as a walking club. ?Where to find more information ?For help and guidance and to learn more about diabetes and diabetes prevention, visit: ?American Diabetes Association (ADA): www.diabetes.org ?[employer] of Diabetes and Digestive and Kidney Diseases: CarFlippers.tn ?To learn more about healthy eating, visit: ?U.S[place] Department of Agriculture Architect): https://ball-collins.biz/ ?Office of Disease Prevention and Health Promotion (ODPHP): ResearchName.uy ?Summary ?You can delay or prevent type 2 diabetes by eating  healthy foods, losing weight if needed, and increasing your physical activity. ?Talk with your health care provider about your risk factors for type 2 diabetes and how you can reduce your risk. ?It can be difficult to recognize the signs of type 2 diabetes. The best way to avoid possible damage to your body is to take action to prevent the disease before you develop symptoms. ?Get screening tests as told by your health care provider. ?This information is not intended to replace advice given to you by your health care provider. Make sure you discuss any questions you have with your health care provider. ?Document Revised: 01/12/2021 Document Reviewed: 01/12/2021 ?Elsevier Patient Education ? 2022 Elsevier Inc. ?Discussed eating small frequent meal, reduction in acidic foods, fried foods ,spicy foods, alcohol caffeine and tobacco and certain medications. Avoid laying down after eating 84mins-1hour, elevated head of the bed.  ?Tdap (Tetanus, Diphtheria, Pertussis) Vaccine: What You Need to Know ?1. Why get vaccinated? ?Tdap vaccine can prevent tetanus, diphtheria, and pertussis. ?Diphtheria and pertussis spread from person to person. Tetanus enters the body through cuts or wounds. ?TETANUS (T) causes painful stiffening of the muscles. Tetanus can lead to serious health problems, including being unable to open the mouth, having trouble swallowing and breathing, or death. ?DIPHTHERIA (  D) can lead to difficulty breathing, heart failure, paralysis, or death. ?PERTUSSIS (aP), also known as "whooping cough," can cause uncontrollable, violent coughing that makes it hard to breathe, eat, or drink. Pertussis can be extremely serious especially in babies and young children, causing pneumonia, convulsions, brain damage, or death. In teens and adults, it can cause weight loss, loss of bladder control, passing out, and rib fractures from severe coughing. ?2. Tdap vaccine ?Tdap is only for children 7 years and older, adolescents,  and adults.  ?Adolescents should receive a single dose of Tdap, preferably at age 44 or 12 years. ?Pregnant people should get a dose of Tdap during every pregnancy, preferably during the early part of the third

## 2022-02-23 ENCOUNTER — Encounter (INDEPENDENT_AMBULATORY_CARE_PROVIDER_SITE_OTHER): Payer: Self-pay | Admitting: Primary Care

## 2022-02-23 ENCOUNTER — Ambulatory Visit (INDEPENDENT_AMBULATORY_CARE_PROVIDER_SITE_OTHER): Payer: Self-pay | Admitting: Primary Care

## 2022-02-23 VITALS — BP 142/83 | HR 59 | Temp 97.8°F | Ht 74.0 in | Wt 217.6 lb

## 2022-02-23 DIAGNOSIS — Z1211 Encounter for screening for malignant neoplasm of colon: Secondary | ICD-10-CM

## 2022-02-23 DIAGNOSIS — I10 Essential (primary) hypertension: Secondary | ICD-10-CM

## 2022-02-23 NOTE — Patient Instructions (Signed)
Hypertension, Adult High blood pressure (hypertension) is when the force of blood pumping through the arteries is too strong. The arteries are the blood vessels that carry blood from the heart throughout the body. Hypertension forces the heart to work harder to pump blood and may cause arteries to become narrow or stiff. Untreated or uncontrolled hypertension can lead to a heart attack, heart failure, a stroke, kidney disease, and other problems. A blood pressure reading consists of a higher number over a lower number. Ideally, your blood pressure should be below 120/80. The first ("top") number is called the systolic pressure. It is a measure of the pressure in your arteries as your heart beats. The second ("bottom") number is called the diastolic pressure. It is a measure of the pressure in your arteries as the heart relaxes. What are the causes? The exact cause of this condition is not known. There are some conditions that result in high blood pressure. What increases the risk? Certain factors may make you more likely to develop high blood pressure. Some of these risk factors are under your control, including: Smoking. Not getting enough exercise or physical activity. Being overweight. Having too much fat, sugar, calories, or salt (sodium) in your diet. Drinking too much alcohol. Other risk factors include: Having a personal history of heart disease, diabetes, high cholesterol, or kidney disease. Stress. Having a family history of high blood pressure and high cholesterol. Having obstructive sleep apnea. Age. The risk increases with age. What are the signs or symptoms? High blood pressure may not cause symptoms. Very high blood pressure (hypertensive crisis) may cause: Headache. Fast or irregular heartbeats (palpitations). Shortness of breath. Nosebleed. Nausea and vomiting. Vision changes. Severe chest pain, dizziness, and seizures. How is this diagnosed? This condition is diagnosed by  measuring your blood pressure while you are seated, with your arm resting on a flat surface, your legs uncrossed, and your feet flat on the floor. The cuff of the blood pressure monitor will be placed directly against the skin of your upper arm at the level of your heart. Blood pressure should be measured at least twice using the same arm. Certain conditions can cause a difference in blood pressure between your right and left arms. If you have a high blood pressure reading during one visit or you have normal blood pressure with other risk factors, you may be asked to: Return on a different day to have your blood pressure checked again. Monitor your blood pressure at home for 1 week or longer. If you are diagnosed with hypertension, you may have other blood or imaging tests to help your health care provider understand your overall risk for other conditions. How is this treated? This condition is treated by making healthy lifestyle changes, such as eating healthy foods, exercising more, and reducing your alcohol intake. You may be referred for counseling on a healthy diet and physical activity. Your health care provider may prescribe medicine if lifestyle changes are not enough to get your blood pressure under control and if: Your systolic blood pressure is above 130. Your diastolic blood pressure is above 80. Your personal target blood pressure may vary depending on your medical conditions, your age, and other factors. Follow these instructions at home: Eating and drinking  Eat a diet that is high in fiber and potassium, and low in sodium, added sugar, and fat. An example of this eating plan is called the DASH diet. DASH stands for Dietary Approaches to Stop Hypertension. To eat this way: Eat   plenty of fresh fruits and vegetables. Try to fill one half of your plate at each meal with fruits and vegetables. Eat whole grains, such as whole-wheat pasta, brown rice, or whole-grain bread. Fill about one  fourth of your plate with whole grains. Eat or drink low-fat dairy products, such as skim milk or low-fat yogurt. Avoid fatty cuts of meat, processed or cured meats, and poultry with skin. Fill about one fourth of your plate with lean proteins, such as fish, chicken without skin, beans, eggs, or tofu. Avoid pre-made and processed foods. These tend to be higher in sodium, added sugar, and fat. Reduce your daily sodium intake. Many people with hypertension should eat less than 1,500 mg of sodium a day. Do not drink alcohol if: Your health care provider tells you not to drink. You are pregnant, may be pregnant, or are planning to become pregnant. If you drink alcohol: Limit how much you have to: 0-1 drink a day for women. 0-2 drinks a day for men. Know how much alcohol is in your drink. In the [place] one drink equals one 12 oz bottle of beer (355 mL), one 5 oz glass of wine (148 mL), or one 1 oz glass of hard liquor (44 mL). Lifestyle  Work with your health care provider to maintain a healthy body weight or to lose weight. Ask what an ideal weight is for you. Get at least 30 minutes of exercise that causes your heart to beat faster (aerobic exercise) most days of the week. Activities may include walking, swimming, or biking. Include exercise to strengthen your muscles (resistance exercise), such as Pilates or lifting weights, as part of your weekly exercise routine. Try to do these types of exercises for 30 minutes at least 3 days a week. Do not use any products that contain nicotine or tobacco. These products include cigarettes, chewing tobacco, and vaping devices, such as e-cigarettes. If you need help quitting, ask your health care provider. Monitor your blood pressure at home as told by your health care provider. Keep all follow-up visits. This is important. Medicines Take over-the-counter and prescription medicines only as told by your health care provider. Follow directions carefully. Blood  pressure medicines must be taken as prescribed. Do not skip doses of blood pressure medicine. Doing this puts you at risk for problems and can make the medicine less effective. Ask your health care provider about side effects or reactions to medicines that you should watch for. Contact a health care provider if you: Think you are having a reaction to a medicine you are taking. Have headaches that keep coming back (recurring). Feel dizzy. Have swelling in your ankles. Have trouble with your vision. Get help right away if you: Develop a severe headache or confusion. Have unusual weakness or numbness. Feel faint. Have severe pain in your chest or abdomen. Vomit repeatedly. Have trouble breathing. These symptoms may be an emergency. Get help right away. Call 911. Do not wait to see if the symptoms will go away. Do not drive yourself to the hospital. Summary Hypertension is when the force of blood pumping through your arteries is too strong. If this condition is not controlled, it may put you at risk for serious complications. Your personal target blood pressure may vary depending on your medical conditions, your age, and other factors. For most people, a normal blood pressure is less than 120/80. Hypertension is treated with lifestyle changes, medicines, or a combination of both. Lifestyle changes include losing weight, eating a healthy,   low-sodium diet, exercising more, and limiting alcohol. This information is not intended to replace advice given to you by your health care provider. Make sure you discuss any questions you have with your health care provider. Document Revised: 08/25/2021 Document Reviewed: 08/25/2021 Elsevier Patient Education  2023 Elsevier Inc.  

## 2022-02-24 NOTE — Progress Notes (Signed)
?Duane [name] ? ? ?Duane [name] is a 61 y.o. male presents for hypertension evaluation, Denies shortness of breath, headaches, chest pain or lower extremity edema, sudden onset, vision changes, unilateral weakness, dizziness, paresthesias  ? ?Patient reports adherence with medications. ? ?Dietary habits include: ate 2 hot dogs before about discussed foods high in sodium ?Exercise habits include:walking ?Family / Social history: no ? ? ?History reviewed. No pertinent past medical history. ?History reviewed. No pertinent surgical history. ?Allergies  ?Allergen Reactions  ? Codeine   ?  "I get jittery."   ? ?Current Outpatient Medications on File Prior to Visit  ?Medication Sig Dispense Refill  ? amLODipine (NORVASC) 10 MG tablet Take 1 tablet (10 mg total) by mouth daily. 90 tablet 1  ? hydrochlorothiazide (HYDRODIURIL) 25 MG tablet Take 1 tablet (25 mg total) by mouth daily. 90 tablet 1  ? ?No current facility-administered medications on file prior to visit.  ? ?Social History  ? ?Socioeconomic History  ? Marital status: Single  ?  Spouse name: Not on file  ? Number of children: Not on file  ? Years of education: Not on file  ? Highest education level: Not on file  ?Occupational History  ? Not on file  ?Tobacco Use  ? Smoking status: Every Day  ? Smokeless tobacco: Never  ?Substance and Sexual Activity  ? Alcohol use: Yes  ?  Comment: 3x/week  ? Drug use: Not on file  ? Sexual activity: Not on file  ?Other Topics Concern  ? Not on file  ?Social History Narrative  ? Not on file  ? ?Social Determinants of Health  ? ?Financial Resource Strain: Not on file  ?Food Insecurity: Not on file  ?Transportation Needs: Not on file  ?Physical Activity: Not on file  ?Stress: Not on file  ?Social Connections: Not on file  ?Intimate Partner Violence: Not on file  ? ?History reviewed. No pertinent family history. ? ? ?OBJECTIVE: ? ?Vitals:  ? 02/23/22 1416  ?BP: (!) 142/83  ?Pulse: (!) 59  ?Temp: 97.8 ?F (36.6 ?C)   ?TempSrc: Oral  ?SpO2: 96%  ?Weight: 217 lb 9.6 oz (98.7 kg)  ?Height: 6\' 2"  (1.88 m)  ? ? ?Physical Exam ?Vitals reviewed.  ?Constitutional:   ?   Appearance: Normal appearance.  ?HENT:  ?   Head: Normocephalic.  ?   Right Ear: Tympanic membrane normal.  ?   Left Ear: Tympanic membrane normal.  ?   Nose: Nose normal.  ?Eyes:  ?   Extraocular Movements: Extraocular movements intact.  ?   Pupils: Pupils are equal, round, and reactive to light.  ?Cardiovascular:  ?   Rate and Rhythm: Normal rate and regular rhythm.  ?Pulmonary:  ?   Effort: Pulmonary effort is normal.  ?   Breath sounds: Normal breath sounds.  ?Abdominal:  ?   General: Bowel sounds are normal.  ?   Palpations: Abdomen is soft.  ?Musculoskeletal:     ?   General: Normal range of motion.  ?   Cervical back: Normal range of motion.  ?Skin: ?   General: Skin is warm and dry.  ?Neurological:  ?   Mental Status: He is alert and oriented to person, place, and time.  ?Psychiatric:     ?   Mood and Affect: Mood normal.     ?   Behavior: Behavior normal.     ?   Thought Content: Thought content normal.     ?   Judgment:  Judgment normal.  ? ? ?ROS ?Comprehensive ROS Pertinent positive and negative noted in HPI   ?Last 3 Office BP readings: ?BP Readings from Last 3 Encounters:  ?02/23/22 (!) 142/83  ?01/12/22 139/83  ?12/19/21 (!) 162/91  ? ? ?BMET ?   ?Component Value Date/Time  ? NA 139 12/19/2021 1813  ? K 4.1 12/19/2021 1813  ? CL 104 12/19/2021 1813  ? CO2 27 12/19/2021 1813  ? GLUCOSE 114 (H) 12/19/2021 1813  ? BUN 6 12/19/2021 1813  ? CREATININE 1.23 12/19/2021 1813  ? CALCIUM 9.7 12/19/2021 1813  ? GFRNONAA >60 12/19/2021 1813  ? ? ?Renal function: ?CrCl cannot be calculated (Patient's most recent lab result is older than the maximum 21 days allowed.). ? ?Clinical ASCVD: Yes  ?The ASCVD Risk score (Arnett DK, et al., 2019) failed to calculate for the following reasons: ?  Cannot find a previous HDL lab ?  Cannot find a previous total cholesterol  lab ? ?ASCVD risk factors include- Duane [name] ? ? ?ASSESSMENT & PLAN: ?Duane [name] was seen today for blood pressure check. ? ?Diagnoses and all orders for this visit: ? ?Colon cancer screening ?-     Fecal occult blood, imunochemical; Future ? ?Essential hypertension ?-Counseled on lifestyle modifications for blood pressure control including reduced dietary sodium, increased exercise, weight reduction and adequate sleep. Also, educated patient about the risk for cardiovascular events, stroke and heart attack. Also counseled patient about the importance of medication adherence. If you participate in smoking, it is important to stop using tobacco as this will increase the risks associated with uncontrolled blood pressure.  ?Goal BP:  ?For patients younger than 60: Goal BP < 130/80. ?For patients 60 and older: Goal BP < 140/90. ?For patients with diabetes: Goal BP < 130/80. ?Your most recent BP: 143/83 ? ?Minimize salt intake. ?Minimize alcohol intake ? ? ? ?This note has been created with Surveyor, quantity. Any transcriptional errors are unintentional.  ? ?Kerin Perna, NP ?02/24/2022, 9:56 AM ?  ?

## 2022-04-14 ENCOUNTER — Encounter (INDEPENDENT_AMBULATORY_CARE_PROVIDER_SITE_OTHER): Payer: Self-pay | Admitting: Primary Care

## 2022-04-14 ENCOUNTER — Other Ambulatory Visit (INDEPENDENT_AMBULATORY_CARE_PROVIDER_SITE_OTHER): Payer: Self-pay | Admitting: Primary Care

## 2022-04-14 ENCOUNTER — Ambulatory Visit (INDEPENDENT_AMBULATORY_CARE_PROVIDER_SITE_OTHER): Payer: Self-pay | Admitting: Primary Care

## 2022-04-14 VITALS — BP 141/83 | HR 68 | Temp 97.5°F | Ht 74.0 in | Wt 216.2 lb

## 2022-04-14 DIAGNOSIS — Z13220 Encounter for screening for lipoid disorders: Secondary | ICD-10-CM

## 2022-04-14 DIAGNOSIS — I10 Essential (primary) hypertension: Secondary | ICD-10-CM

## 2022-04-14 DIAGNOSIS — F17210 Nicotine dependence, cigarettes, uncomplicated: Secondary | ICD-10-CM

## 2022-04-14 DIAGNOSIS — Z1211 Encounter for screening for malignant neoplasm of colon: Secondary | ICD-10-CM

## 2022-04-14 DIAGNOSIS — Z760 Encounter for issue of repeat prescription: Secondary | ICD-10-CM

## 2022-04-14 DIAGNOSIS — E119 Type 2 diabetes mellitus without complications: Secondary | ICD-10-CM

## 2022-04-14 DIAGNOSIS — Z716 Tobacco abuse counseling: Secondary | ICD-10-CM

## 2022-04-14 LAB — POCT GLYCOSYLATED HEMOGLOBIN (HGB A1C): Hemoglobin A1C: 7 % — AB (ref 4.0–5.6)

## 2022-04-14 MED ORDER — METFORMIN HCL 500 MG PO TABS: 500.0000 mg | ORAL_TABLET | Freq: Two times a day (BID) | ORAL | 3 refills | Status: DC

## 2022-04-14 MED ORDER — HYDROCHLOROTHIAZIDE 25 MG PO TABS: 25.0000 mg | ORAL_TABLET | Freq: Every day | ORAL | 1 refills | Status: DC

## 2022-04-14 MED ORDER — AMLODIPINE BESYLATE 10 MG PO TABS: 10.0000 mg | ORAL_TABLET | Freq: Every day | ORAL | 1 refills | Status: DC

## 2022-04-14 NOTE — Progress Notes (Signed)
[facility]   [name] is a 61 y.o. male presents for hypertension evaluation, Denies shortness of breath, headaches, chest pain or lower extremity edema, sudden onset, vision changes, unilateral weakness, dizziness, paresthesias  He is also a prediabetic not on medication at this time reck A1C- denies polyuria, polydipsia , polyphagia or vision changes. Patient reports adherence with medications.  Dietary habits include: took sodium out of diet eating frozen foods instead of cans and increased fruits and vegetables Exercise habits include: walking 2-3 times a week  Family / Social history: No   No past medical history on file. No past surgical history on file. Allergies  Allergen Reactions   Codeine     "I get jittery."    Current Outpatient Medications on File Prior to Visit  Medication Sig Dispense Refill   amLODipine (NORVASC) 10 MG tablet Take 1 tablet (10 mg total) by mouth daily. 90 tablet 1   hydrochlorothiazide (HYDRODIURIL) 25 MG tablet Take 1 tablet (25 mg total) by mouth daily. 90 tablet 1   No current facility-administered medications on file prior to visit.   Social History   Socioeconomic History   Marital status: Single    Spouse name: Not on file   Number of children: Not on file   Years of education: Not on file   Highest education level: Not on file  Occupational History   Not on file  Tobacco Use   Smoking status: Every Day   Smokeless tobacco: Never  Substance and Sexual Activity   Alcohol use: Yes    Comment: 3x/week   Drug use: Not on file   Sexual activity: Not on file  Other Topics Concern   Not on file  Social History Narrative   Not on file   Social Determinants of Health   Financial Resource Strain: Not on file  Food Insecurity: Not on file  Transportation Needs: Not on file  Physical Activity: Not on file  Stress: Not on file  Social Connections: Not on file  Intimate Partner Violence: Not on file   No  family history on file.   OBJECTIVE:  Vitals:   04/14/22 1338  BP: (!) 141/83  Pulse: 68  Temp: (!) 97.5 F (36.4 C)  TempSrc: Oral  SpO2: 96%  Weight: 216 lb 3.2 oz (98.1 kg)  Height: 6\' 2"  (1.88 m)    Physical Exam Vitals reviewed.  Constitutional:      Appearance: Normal appearance. He is normal weight.  HENT:     Head: Normocephalic.     Right Ear: Tympanic membrane normal.     Left Ear: Tympanic membrane and external ear normal.     Nose: Nose normal.  Eyes:     Extraocular Movements: Extraocular movements intact.     Conjunctiva/sclera: Conjunctivae normal.     Pupils: Pupils are equal, round, and reactive to light.  Cardiovascular:     Rate and Rhythm: Normal rate and regular rhythm.  Pulmonary:     Effort: Pulmonary effort is normal.     Breath sounds: Normal breath sounds.  Abdominal:     General: Bowel sounds are normal. There is distension.  Musculoskeletal:        General: Normal range of motion.     Cervical back: Normal range of motion.  Skin:    General: Skin is warm and dry.  Neurological:     Mental Status: He is alert and oriented to person, place, and time.  Psychiatric:  Mood and Affect: Mood normal.        Behavior: Behavior normal.        Thought Content: Thought content normal.        Judgment: Judgment normal.    ROS Comprehensive ROS Pertinent positive and negative noted in HPI   Last 3 Office BP readings: BP Readings from Last 3 Encounters:  04/14/22 (!) 141/83  02/23/22 (!) 142/83  01/12/22 139/83    BMET    Component Value Date/Time   NA 139 12/19/2021 1813   K 4.1 12/19/2021 1813   CL 104 12/19/2021 1813   CO2 27 12/19/2021 1813   GLUCOSE 114 (H) 12/19/2021 1813   BUN 6 12/19/2021 1813   CREATININE 1.23 12/19/2021 1813   CALCIUM 9.7 12/19/2021 1813   GFRNONAA >60 12/19/2021 1813    Renal function: CrCl cannot be calculated (Patient's most recent lab result is older than the maximum 21 days  allowed.).  Clinical ASCVD: No  The ASCVD Risk score (Arnett DK, et al., 2019) failed to calculate for the following reasons:   Cannot find a previous HDL lab   Cannot find a previous total cholesterol lab  ASCVD risk factors include- Duane [name]   ASSESSMENT & PLAN: [name] was seen today for diabetes.  Diagnoses and all orders for this visit:  Type 2 diabetes mellitus without complication, without long-term current use of insulin (HCC) Previously prediabetic now A1C 7.0 per ADA guidelines now a diabetic  Discussed reducing foods that are high in carbohydrates are the following rice, grits potatoes, breads, sugars, and pastas.  Reduction in the intake (eating) will assist in lowering your blood sugars.  Start metformin 500mg  BID  Medication refill -     hydrochlorothiazide (HYDRODIURIL) 25 MG tablet; Take 1 tablet (25 mg total) by mouth daily. -     amLODipine (NORVASC) 10 MG tablet; Take 1 tablet (10 mg total) by mouth daily.  Lipid screening -     Lipid Panel  Essential hypertension -Counseled on lifestyle modifications for blood pressure control including reduced dietary sodium, increased exercise, weight reduction and adequate sleep. Also, educated patient about the risk for cardiovascular events, stroke and heart attack. Also counseled patient about the importance of medication adherence. If you participate in smoking, it is important to stop using tobacco as this will increase the risks associated with uncontrolled blood pressure.   Goal BP:  For patients younger than 60: Goal BP < 130/80. For patients 60 and older: Goal BP < 140/90. For patients with diabetes: Goal BP < 130/80. Your most recent BP: 141/83  Minimize salt intake. Minimize alcohol intake  Tobacco abuse counseling Cut down to 3 a day - I have recommended complete cessation of tobacco use. I have discussed various options available for assistance with tobacco cessation including over the counter methods (Nicotine  gum, patch and lozenges). We also discussed prescription options (Chantix, Nicotine Inhaler / Nasal Spray). The patient is interested in pursuing any prescription tobacco cessation options at this time. Would like to try patches but can not afford them. - Patient declines at this time.   Colon cancer screening FOBT ordered    This note has been created with . Any transcriptional errors are unintentional.   Education officer, environmental, NP 04/14/2022, 1:41 PM

## 2022-04-14 NOTE — Addendum Note (Signed)
Addended by: Nat Christen on: 04/14/2022 02:14 PM   Modules accepted: Orders

## 2022-04-14 NOTE — Patient Instructions (Signed)

## 2022-04-16 LAB — LIPID PANEL
Chol/HDL Ratio: 4.6 ratio (ref 0.0–5.0)
Cholesterol, Total: 195 mg/dL (ref 100–199)
HDL: 42 mg/dL (ref >39)
LDL Chol Calc (NIH): 131 mg/dL — ABNORMAL HIGH (ref 0–99)
Triglycerides: 122 mg/dL (ref 0–149)
VLDL Cholesterol Cal: 22 mg/dL (ref 5–40)

## 2022-04-16 LAB — SPECIMEN STATUS REPORT

## 2022-04-20 ENCOUNTER — Other Ambulatory Visit (INDEPENDENT_AMBULATORY_CARE_PROVIDER_SITE_OTHER): Payer: Self-pay | Admitting: Primary Care

## 2022-04-20 MED ORDER — PRAVASTATIN SODIUM 20 MG PO TABS: 20.0000 mg | ORAL_TABLET | Freq: Every day | ORAL | 1 refills | Status: DC

## 2022-04-22 ENCOUNTER — Telehealth (INDEPENDENT_AMBULATORY_CARE_PROVIDER_SITE_OTHER): Payer: Self-pay

## 2022-04-22 NOTE — Telephone Encounter (Signed)
-----   Message from [name], NP sent at 04/20/2022 10:48 AM EDT ----- Your cholesterol is high, Increase risk of heart attack and/or stroke.  To reduce your Cholesterol , Remember - more fruits and vegetables, more fish, and limit red meat and dairy products. More soy, nuts, beans, barley, lentils, oats and plant sterol ester enriched margarine instead of butter. I also encourage eliminating sugar and processed food. New script sent for pravastatin 20mg

## 2022-04-22 NOTE — Telephone Encounter (Signed)
Date of birth verified. Patient aware of results, medication being sent and dietary advise. He verbalized understanding. Maryjean Morn, CMA

## 2022-07-15 ENCOUNTER — Ambulatory Visit (INDEPENDENT_AMBULATORY_CARE_PROVIDER_SITE_OTHER): Payer: Self-pay | Admitting: Primary Care

## 2022-08-18 ENCOUNTER — Ambulatory Visit (INDEPENDENT_AMBULATORY_CARE_PROVIDER_SITE_OTHER): Payer: Self-pay | Admitting: Primary Care

## 2022-09-07 ENCOUNTER — Ambulatory Visit (INDEPENDENT_AMBULATORY_CARE_PROVIDER_SITE_OTHER): Payer: Commercial Managed Care - HMO | Admitting: Primary Care

## 2022-09-07 ENCOUNTER — Encounter (INDEPENDENT_AMBULATORY_CARE_PROVIDER_SITE_OTHER): Payer: Self-pay | Admitting: Primary Care

## 2022-09-07 VITALS — BP 143/83 | HR 79 | Resp 16 | Wt 215.8 lb

## 2022-09-07 DIAGNOSIS — I10 Essential (primary) hypertension: Secondary | ICD-10-CM | POA: Diagnosis not present

## 2022-09-07 DIAGNOSIS — E119 Type 2 diabetes mellitus without complications: Secondary | ICD-10-CM

## 2022-09-07 DIAGNOSIS — Z23 Encounter for immunization: Secondary | ICD-10-CM

## 2022-09-07 DIAGNOSIS — Z760 Encounter for issue of repeat prescription: Secondary | ICD-10-CM

## 2022-09-07 DIAGNOSIS — Z1211 Encounter for screening for malignant neoplasm of colon: Secondary | ICD-10-CM

## 2022-09-07 LAB — POCT GLYCOSYLATED HEMOGLOBIN (HGB A1C): HbA1c, POC (controlled diabetic range): 6.3 % (ref 0.0–7.0)

## 2022-09-07 MED ORDER — AMLODIPINE BESYLATE 10 MG PO TABS: 10.0000 mg | ORAL_TABLET | Freq: Every day | ORAL | 1 refills | Status: DC

## 2022-09-07 MED ORDER — METFORMIN HCL 500 MG PO TABS: 500.0000 mg | ORAL_TABLET | Freq: Two times a day (BID) | ORAL | 1 refills | Status: DC

## 2022-09-07 MED ORDER — HYDROCHLOROTHIAZIDE 25 MG PO TABS: 25.0000 mg | ORAL_TABLET | Freq: Every day | ORAL | 1 refills | Status: DC

## 2022-09-07 MED ORDER — PRAVASTATIN SODIUM 20 MG PO TABS: 20.0000 mg | ORAL_TABLET | Freq: Every day | ORAL | 1 refills | Status: DC

## 2022-09-12 NOTE — Progress Notes (Signed)
[facility]  Duane [name], is a 61 y.o. male  [id number]  [id number]  DOB - 1960-12-22  Chief Complaint  Patient presents with   Diabetes       Subjective:   Duane [name] is a 61 y.o. male here today for a follow up visit for the management of diabetes -he denies polyuria, polydipsia, polyphasia or vision changes.  Hypertension- patient has No headache, No chest pain, No abdominal pain - No Nausea, No new weakness tingling or numbness, No Cough - shortness of breath  No problems updated.  Allergies  Allergen Reactions   Codeine     "I get jittery."     No past medical history on file.  No current outpatient medications on file prior to visit.   No current facility-administered medications on file prior to visit.    Objective:   Vitals:   09/07/22 1535  BP: (!) 143/83  Pulse: 79  Resp: 16  SpO2: 98%  Weight: 215 lb 12.8 oz (97.9 kg)    Exam General appearance : Awake, alert, not in any distress. Speech Clear. Not toxic looking HEENT: Atraumatic and Normocephalic, pupils equally reactive to light and accomodation Neck: Supple, no JVD. No cervical lymphadenopathy.  Chest: Good air entry bilaterally, no added sounds  CVS: S1 S2 regular, no murmurs.  Abdomen: Bowel sounds present, Non tender and not distended with no gaurding, rigidity or rebound. Extremities: B/L Lower Ext shows no edema, both legs are warm to touch Neurology: Awake alert, and oriented X 3, CN II-XII intact, Non focal Skin: No Rash  Data Review Lab Results  Component Value Date   HGBA1C 6.3 09/07/2022   HGBA1C 7.0 (A) 04/14/2022   HGBA1C 6.6 (A) 01/12/2022    Assessment & Plan   1. Type 2 diabetes mellitus without complication, without long-term current use of insulin (HCC) - educated on lifestyle modifications, including but not limited to diet choices and adding exercise to daily routine.   - POCT glycosylated hemoglobin (Hb A1C) 6.3 previously 7.0.   Well-controlled per ADA guidelines - metFORMIN (GLUCOPHAGE) 500 MG tablet; Take 1 tablet (500 mg total) by mouth 2 (two) times daily with a meal.  Dispense: 180 tablet; Refill: 1  2. Medication refill - hydrochlorothiazide (HYDRODIURIL) 25 MG tablet; Take 1 tablet (25 mg total) by mouth daily.  Dispense: 90 tablet; Refill: 1 - amLODipine (NORVASC) 10 MG tablet; Take 1 tablet (10 mg total) by mouth daily.  Dispense: 90 tablet; Refill: 1  3. Essential hypertension BP goal - <140/90  explained that having normal blood pressure is the goal and medications are helping to get to goal and maintain normal blood pressure. DIET: Limit salt intake, read nutrition labels to check salt content, limit fried and high fatty foods  Avoid using multisymptom OTC cold preparations that generally contain sudafed which can rise BP. Consult with pharmacist on best cold relief products to use for persons with HTN EXERCISE Discussed incorporating exercise such as walking - 30 minutes most days of the week and can do in 10 minute intervals    - hydrochlorothiazide (HYDRODIURIL) 25 MG tablet; Take 1 tablet (25 mg total) by mouth daily.  Dispense: 90 tablet; Refill: 1 - amLODipine (NORVASC) 10 MG tablet; Take 1 tablet (10 mg total) by mouth daily.  Dispense: 90 tablet; Refill: 1  4. Colon cancer screening Health maintenance - Ambulatory referral to Gastroenterology  5. Need for immunization against influenza Health maintenance preventive care  6. Need for shingles vaccine Health  maintenance preventive care   Patient have been counseled extensively about nutrition and exercise. Other issues discussed during this visit include: low cholesterol diet, weight control and daily exercise, foot care, annual eye examinations at Ophthalmology, importance of adherence with medications and regular follow-up. We also discussed long term complications of uncontrolled diabetes and hypertension.   Return in about 3 months (around  12/08/2022) for HTN/fasting.  The patient was given clear instructions to go to ER or return to medical center if symptoms don't improve, worsen or new problems develop. The patient verbalized understanding. The patient was told to call to get lab results if they haven't heard anything in the next week.   This note has been created with Education officer, environmental. Any transcriptional errors are unintentional.   [name], NP 09/12/2022, 10:20 PM

## 2022-10-01 DIAGNOSIS — Z419 Encounter for procedure for purposes other than remedying health state, unspecified: Secondary | ICD-10-CM | POA: Diagnosis not present

## 2022-11-01 DIAGNOSIS — Z419 Encounter for procedure for purposes other than remedying health state, unspecified: Secondary | ICD-10-CM | POA: Diagnosis not present

## 2022-12-02 DIAGNOSIS — Z419 Encounter for procedure for purposes other than remedying health state, unspecified: Secondary | ICD-10-CM | POA: Diagnosis not present

## 2022-12-08 ENCOUNTER — Ambulatory Visit (INDEPENDENT_AMBULATORY_CARE_PROVIDER_SITE_OTHER): Payer: Commercial Managed Care - HMO | Admitting: Primary Care

## 2022-12-23 ENCOUNTER — Ambulatory Visit (INDEPENDENT_AMBULATORY_CARE_PROVIDER_SITE_OTHER): Payer: Commercial Managed Care - HMO | Admitting: Primary Care

## 2022-12-23 ENCOUNTER — Encounter (INDEPENDENT_AMBULATORY_CARE_PROVIDER_SITE_OTHER): Payer: Self-pay | Admitting: Primary Care

## 2022-12-23 VITALS — BP 134/82 | HR 68 | Resp 16 | Ht 74.0 in | Wt 202.2 lb

## 2022-12-23 DIAGNOSIS — I10 Essential (primary) hypertension: Secondary | ICD-10-CM | POA: Diagnosis not present

## 2022-12-23 DIAGNOSIS — Z1211 Encounter for screening for malignant neoplasm of colon: Secondary | ICD-10-CM | POA: Diagnosis not present

## 2022-12-23 DIAGNOSIS — Z1159 Encounter for screening for other viral diseases: Secondary | ICD-10-CM

## 2022-12-23 DIAGNOSIS — R300 Dysuria: Secondary | ICD-10-CM | POA: Diagnosis not present

## 2022-12-23 DIAGNOSIS — Z114 Encounter for screening for human immunodeficiency virus [HIV]: Secondary | ICD-10-CM | POA: Diagnosis not present

## 2022-12-23 DIAGNOSIS — E119 Type 2 diabetes mellitus without complications: Secondary | ICD-10-CM | POA: Diagnosis not present

## 2022-12-25 LAB — LIPID PANEL
Chol/HDL Ratio: 3.1 ratio (ref 0.0–5.0)
Cholesterol, Total: 174 mg/dL (ref 100–199)
HDL: 57 mg/dL (ref >39)
LDL Chol Calc (NIH): 102 mg/dL — ABNORMAL HIGH (ref 0–99)
Triglycerides: 82 mg/dL (ref 0–149)
VLDL Cholesterol Cal: 15 mg/dL (ref 5–40)

## 2022-12-25 LAB — MICROSCOPIC EXAMINATION
Casts: NONE SEEN /lpf
RBC, Urine: NONE SEEN /hpf (ref 0–2)

## 2022-12-25 LAB — CBC WITH DIFFERENTIAL/PLATELET
Basophils Absolute: 0.1 10*3/uL (ref 0.0–0.2)
Basos: 1 %
EOS (ABSOLUTE): 0.1 10*3/uL (ref 0.0–0.4)
Eos: 1 %
Hematocrit: 42.1 % (ref 37.5–51.0)
Hemoglobin: 13.7 g/dL (ref 13.0–17.7)
Immature Grans (Abs): 0 10*3/uL (ref 0.0–0.1)
Immature Granulocytes: 0 %
Lymphocytes Absolute: 2.5 10*3/uL (ref 0.7–3.1)
Lymphs: 33 %
MCH: 28 pg (ref 26.6–33.0)
MCHC: 32.5 g/dL (ref 31.5–35.7)
MCV: 86 fL (ref 79–97)
Monocytes Absolute: 0.5 10*3/uL (ref 0.1–0.9)
Monocytes: 6 %
Neutrophils Absolute: 4.6 10*3/uL (ref 1.4–7.0)
Neutrophils: 59 %
Platelets: 239 10*3/uL (ref 150–450)
RBC: 4.89 x10E6/uL (ref 4.14–5.80)
RDW: 16.3 % — ABNORMAL HIGH (ref 11.6–15.4)
WBC: 7.8 10*3/uL (ref 3.4–10.8)

## 2022-12-25 LAB — COMPREHENSIVE METABOLIC PANEL
ALT: 10 IU/L (ref 0–44)
AST: 15 IU/L (ref 0–40)
Albumin/Globulin Ratio: 1.3 (ref 1.2–2.2)
Albumin: 4.3 g/dL (ref 3.9–4.9)
Alkaline Phosphatase: 77 IU/L (ref 44–121)
BUN/Creatinine Ratio: 11 (ref 10–24)
BUN: 11 mg/dL (ref 8–27)
Bilirubin Total: 1 mg/dL (ref 0.0–1.2)
CO2: 23 mmol/L (ref 20–29)
Calcium: 9.6 mg/dL (ref 8.6–10.2)
Chloride: 100 mmol/L (ref 96–106)
Creatinine, Ser: 0.99 mg/dL (ref 0.76–1.27)
Globulin, Total: 3.2 g/dL (ref 1.5–4.5)
Glucose: 105 mg/dL — ABNORMAL HIGH (ref 70–99)
Potassium: 3.9 mmol/L (ref 3.5–5.2)
Sodium: 140 mmol/L (ref 134–144)
Total Protein: 7.5 g/dL (ref 6.0–8.5)
eGFR: 87 mL/min/{1.73_m2} (ref >59)

## 2022-12-25 LAB — MICROALBUMIN / CREATININE URINE RATIO
Creatinine, Urine: 114.7 mg/dL
Microalb/Creat Ratio: 10 mg/g creat (ref 0–29)
Microalbumin, Urine: 11.9 ug/mL

## 2022-12-25 LAB — HEMOGLOBIN A1C
Est. average glucose Bld gHb Est-mCnc: 137 mg/dL
Hgb A1c MFr Bld: 6.4 % — ABNORMAL HIGH (ref 4.8–5.6)

## 2022-12-25 LAB — URINALYSIS, COMPLETE
Bilirubin, UA: NEGATIVE
Glucose, UA: NEGATIVE
Ketones, UA: NEGATIVE
Nitrite, UA: POSITIVE — AB
Protein,UA: NEGATIVE
RBC, UA: NEGATIVE
Specific Gravity, UA: 1.014 (ref 1.005–1.030)
Urobilinogen, Ur: 0.2 mg/dL (ref 0.2–1.0)
pH, UA: 5.5 (ref 5.0–7.5)

## 2022-12-25 LAB — HCV AB W REFLEX TO QUANT PCR: HCV Ab: NONREACTIVE

## 2022-12-25 LAB — HIV ANTIBODY (ROUTINE TESTING W REFLEX): HIV Screen 4th Generation wRfx: NONREACTIVE

## 2022-12-25 LAB — HCV INTERPRETATION

## 2022-12-30 ENCOUNTER — Other Ambulatory Visit (INDEPENDENT_AMBULATORY_CARE_PROVIDER_SITE_OTHER): Payer: Self-pay | Admitting: Primary Care

## 2022-12-30 DIAGNOSIS — N390 Urinary tract infection, site not specified: Secondary | ICD-10-CM

## 2022-12-30 MED ORDER — CIPROFLOXACIN HCL 500 MG PO TABS: 500.0000 mg | ORAL_TABLET | Freq: Two times a day (BID) | ORAL | 0 refills | Status: AC

## 2022-12-31 ENCOUNTER — Telehealth (INDEPENDENT_AMBULATORY_CARE_PROVIDER_SITE_OTHER): Payer: Self-pay

## 2022-12-31 DIAGNOSIS — Z419 Encounter for procedure for purposes other than remedying health state, unspecified: Secondary | ICD-10-CM | POA: Diagnosis not present

## 2022-12-31 NOTE — Telephone Encounter (Signed)
Contacted pt to go over lab results pt is aware and doesn't have any questions or concerns 

## 2023-01-02 NOTE — Progress Notes (Signed)
[place], is a 62 y.o. male  [id number]  [id number]  DOB - 02-06-1961  Chief Complaint  Patient presents with   Hypertension       Subjective:   Duane [name] is a 62 y.o. male here today for a follow up visit. Patient has No headache, No chest pain, No abdominal pain - No Nausea, No new weakness tingling or numbness, No Cough - shortness of breath  No problems updated.  Allergies  Allergen Reactions   Codeine     "I get jittery."     No past medical history on file.  Current Outpatient Medications on File Prior to Visit  Medication Sig Dispense Refill   amLODipine (NORVASC) 10 MG tablet Take 1 tablet (10 mg total) by mouth daily. 90 tablet 1   hydrochlorothiazide (HYDRODIURIL) 25 MG tablet Take 1 tablet (25 mg total) by mouth daily. 90 tablet 1   metFORMIN (GLUCOPHAGE) 500 MG tablet Take 1 tablet (500 mg total) by mouth 2 (two) times daily with a meal. 180 tablet 1   pravastatin (PRAVACHOL) 20 MG tablet Take 1 tablet (20 mg total) by mouth daily. 90 tablet 1   No current facility-administered medications on file prior to visit.    Objective:   Vitals:   12/23/22 1123  BP: 134/82  Pulse: 68  Resp: 16  SpO2: 98%  Weight: 202 lb 3.2 oz (91.7 kg)  Height: '6\' 2"'$  (1.88 m)    Comprehensive ROS Pertinent positive and negative noted in HPI   Exam General appearance : Awake, alert, not in any distress. Speech Clear. Not toxic looking HEENT: Atraumatic and Normocephalic, pupils equally reactive to light and accomodation Neck: Supple, no JVD. No cervical lymphadenopathy.  Chest: Good air entry bilaterally, no added sounds  CVS: S1 S2 regular, no murmurs.  Abdomen: Bowel sounds present, Non tender and not distended with no gaurding, rigidity or rebound. Extremities: B/L Lower Ext shows no edema, both legs are warm to touch Neurology: Awake alert, and oriented X 3, CN II-XII intact, Non focal Skin: No Rash  Data Review Lab  Results  Component Value Date   HGBA1C 6.4 (H) 12/23/2022   HGBA1C 6.3 09/07/2022   HGBA1C 7.0 (A) 04/14/2022    Assessment & Plan  Duane [name] was seen today for hypertension.  Diagnoses and all orders for this visit:  Essential hypertension Well control <140/90 DASH diet and exercise  -     CBC with Differential/Platelet -     Comprehensive metabolic panel  Type 2 diabetes mellitus without complication, without long-term current use of insulin ([place]) - educated on lifestyle modifications, including but not limited to diet choices and adding exercise to daily routine.   No change in medication for diabetes -     Lipid panel -     Microalbumin / creatinine urine ratio -     Hemoglobin A1c 6.4  Encounter for screening for HIV -     HIV Antibody (routine testing w rflx)  Encounter for HCV screening test for low risk patient -     HCV Ab w Reflex to Quant PCR  Colon cancer screening -     Ambulatory referral to Gastroenterology  Dysuria -     Urinalysis, Complete  Other orders -     Microscopic Examination -     Interpretation:     Patient have been counseled extensively about nutrition and exercise. Other issues discussed during this visit include: low cholesterol diet, weight control and  daily exercise, foot care, annual eye examinations at Ophthalmology, importance of adherence with medications and regular follow-up. We also discussed long term complications of uncontrolled diabetes and hypertension.   Return in about 3 months (around 03/23/2023).  The patient was given clear instructions to go to ER or return to medical center if symptoms don't improve, worsen or new problems develop. The patient verbalized understanding. The patient was told to call to get lab results if they haven't heard anything in the next week.   This note has been created with Surveyor, quantity. Any transcriptional errors are unintentional.   Kerin Perna, NP 01/02/2023, 8:25 PM

## 2023-01-31 DIAGNOSIS — Z419 Encounter for procedure for purposes other than remedying health state, unspecified: Secondary | ICD-10-CM | POA: Diagnosis not present

## 2023-02-01 ENCOUNTER — Encounter: Payer: Self-pay | Admitting: Internal Medicine

## 2023-02-03 ENCOUNTER — Ambulatory Visit (AMBULATORY_SURGERY_CENTER): Payer: Commercial Managed Care - HMO

## 2023-02-03 VITALS — Ht 74.0 in | Wt 202.0 lb

## 2023-02-03 DIAGNOSIS — Z1211 Encounter for screening for malignant neoplasm of colon: Secondary | ICD-10-CM

## 2023-02-03 MED ORDER — NA SULFATE-K SULFATE-MG SULF 17.5-3.13-1.6 GM/177ML PO SOLN: 1.0000 | Freq: Once | ORAL | 0 refills | Status: AC

## 2023-02-03 NOTE — Progress Notes (Signed)

## 2023-02-24 ENCOUNTER — Encounter: Payer: Commercial Managed Care - HMO | Admitting: Internal Medicine

## 2023-03-25 ENCOUNTER — Encounter: Payer: Commercial Managed Care - HMO | Admitting: Internal Medicine

## 2023-04-04 ENCOUNTER — Other Ambulatory Visit (INDEPENDENT_AMBULATORY_CARE_PROVIDER_SITE_OTHER): Payer: Self-pay | Admitting: Primary Care

## 2023-04-05 NOTE — Telephone Encounter (Signed)
Requested Prescriptions  Pending Prescriptions Disp Refills   pravastatin (PRAVACHOL) 20 MG tablet [Pharmacy Med Name: Pravastatin Sodium 20 MG Oral Tablet] 90 tablet 0    Sig: Take 1 tablet by mouth once daily     Cardiovascular:  Antilipid - Statins Failed - 04/04/2023  9:40 AM      Failed - Lipid Panel in normal range within the last 12 months    Cholesterol, Total  Date Value Ref Range Status  12/23/2022 174 100 - 199 mg/dL Final   LDL Chol Calc (NIH)  Date Value Ref Range Status  12/23/2022 102 (H) 0 - 99 mg/dL Final   HDL  Date Value Ref Range Status  12/23/2022 57 >39 mg/dL Final   Triglycerides  Date Value Ref Range Status  12/23/2022 82 0 - 149 mg/dL Final         Passed - Patient is not pregnant      Passed - Valid encounter within last 12 months    Recent Outpatient Visits           3 months ago Essential hypertension   [place] [facility] [name], NP   7 months ago Type 2 diabetes mellitus without complication, without long-term current use of insulin (HCC)   [place] [facility] [name], NP   11 months ago Essential hypertension   [place] [facility] [name], NP   1 year ago Colon cancer screening   [place] [facility] [name], NP   1 year ago Screening for diabetes mellitus   [place] [facility] [name], NP

## 2023-04-20 ENCOUNTER — Encounter: Payer: Self-pay | Admitting: Internal Medicine

## 2023-04-26 ENCOUNTER — Encounter: Payer: Commercial Managed Care - HMO | Admitting: Internal Medicine

## 2023-05-16 ENCOUNTER — Other Ambulatory Visit: Payer: Self-pay

## 2023-05-16 ENCOUNTER — Emergency Department (HOSPITAL_COMMUNITY)
Admission: EM | Admit: 2023-05-16 | Discharge: 2023-05-16 | Discharge status: Against Medical Advice | Payer: Commercial Managed Care - HMO | Attending: Emergency Medicine | Admitting: Emergency Medicine

## 2023-05-16 DIAGNOSIS — Z5321 Procedure and treatment not carried out due to patient leaving prior to being seen by health care provider: Secondary | ICD-10-CM | POA: Diagnosis not present

## 2023-05-16 DIAGNOSIS — R61 Generalized hyperhidrosis: Secondary | ICD-10-CM | POA: Diagnosis not present

## 2023-05-16 DIAGNOSIS — R112 Nausea with vomiting, unspecified: Secondary | ICD-10-CM | POA: Insufficient documentation

## 2023-05-16 DIAGNOSIS — R109 Unspecified abdominal pain: Secondary | ICD-10-CM | POA: Insufficient documentation

## 2023-05-16 LAB — CBC
HCT: 44.3 % (ref 39.0–52.0)
Hemoglobin: 15.4 g/dL (ref 13.0–17.0)
MCH: 29.2 pg (ref 26.0–34.0)
MCHC: 34.8 g/dL (ref 30.0–36.0)
MCV: 83.9 fL (ref 80.0–100.0)
Platelets: 250 10*3/uL (ref 150–400)
RBC: 5.28 MIL/uL (ref 4.22–5.81)
RDW: 13.7 % (ref 11.5–15.5)
WBC: 7.6 10*3/uL (ref 4.0–10.5)
nRBC: 0 % (ref 0.0–0.2)

## 2023-05-16 LAB — COMPREHENSIVE METABOLIC PANEL
ALT: 16 U/L (ref 0–44)
AST: 18 U/L (ref 15–41)
Albumin: 3.6 g/dL (ref 3.5–5.0)
Alkaline Phosphatase: 65 U/L (ref 38–126)
Anion gap: 16 — ABNORMAL HIGH (ref 5–15)
BUN: 11 mg/dL (ref 8–23)
CO2: 23 mmol/L (ref 22–32)
Calcium: 9.3 mg/dL (ref 8.9–10.3)
Chloride: 98 mmol/L (ref 98–111)
Creatinine, Ser: 1.31 mg/dL — ABNORMAL HIGH (ref 0.61–1.24)
GFR, Estimated: >60 mL/min (ref >60)
Glucose, Bld: 249 mg/dL — ABNORMAL HIGH (ref 70–99)
Potassium: 3 mmol/L — ABNORMAL LOW (ref 3.5–5.1)
Sodium: 137 mmol/L (ref 135–145)
Total Bilirubin: 2.3 mg/dL — ABNORMAL HIGH (ref 0.3–1.2)
Total Protein: 7.6 g/dL (ref 6.5–8.1)

## 2023-05-16 LAB — URINALYSIS, ROUTINE W REFLEX MICROSCOPIC
Bilirubin Urine: NEGATIVE
Glucose, UA: 50 mg/dL — AB
Ketones, ur: 5 mg/dL — AB
Nitrite: NEGATIVE
Protein, ur: NEGATIVE mg/dL
Specific Gravity, Urine: 1.014 (ref 1.005–1.030)
pH: 5 (ref 5.0–8.0)

## 2023-05-16 LAB — LIPASE, BLOOD: Lipase: 23 U/L (ref 11–51)

## 2023-05-16 MED ORDER — ONDANSETRON 4 MG PO TBDP
4.0000 mg | ORAL_TABLET | Freq: Once | ORAL | Status: AC | PRN
  Administered 2023-05-16: 4 mg via ORAL
  Filled 2023-05-16: qty 1

## 2023-05-16 NOTE — ED Triage Notes (Signed)
Pt with two days of central abdominal pain with associated n/v and diaphoresis. Denies diarrhea.

## 2023-05-18 ENCOUNTER — Ambulatory Visit (AMBULATORY_SURGERY_CENTER): Payer: Commercial Managed Care - HMO

## 2023-05-18 VITALS — Ht 74.0 in | Wt 202.0 lb

## 2023-05-18 DIAGNOSIS — Z1211 Encounter for screening for malignant neoplasm of colon: Secondary | ICD-10-CM

## 2023-05-18 MED ORDER — NA SULFATE-K SULFATE-MG SULF 17.5-3.13-1.6 GM/177ML PO SOLN
1.0000 | Freq: Once | ORAL | 0 refills | Status: AC
Start: 2023-05-18 — End: 2023-05-18

## 2023-05-18 MED ORDER — NA SULFATE-K SULFATE-MG SULF 17.5-3.13-1.6 GM/177ML PO SOLN
1.0000 | Freq: Once | ORAL | 0 refills | Status: DC
Start: 2023-05-18 — End: 2023-05-18

## 2023-05-18 NOTE — Addendum Note (Signed)
Addended by: Jaquelyn Bitter on: 05/18/2023 11:43 AM   Modules accepted: Orders

## 2023-05-18 NOTE — Progress Notes (Signed)
No egg or soy allergy known to patient  No issues known to pt with past sedation with any surgeries or procedures Patient denies ever being told they had issues or difficulty with intubation  No FH of Malignant Hyperthermia Pt is not on diet pills Pt is not on  home 02  Pt is not on blood thinners  Pt denies issues with constipation  No A fib or A flutter Have any cardiac testing pending--no Pt instructed to use Singlecare.[email or web address] or GoodRx for a price reduction on prep  Patient's chart reviewed by Cathlyn Parsons CNRA prior to previsit and patient appropriate for the [facility].  Previsit completed and red dot placed by patient's name on their procedure day (on provider's schedule).   Can Ambulate independently

## 2023-05-21 ENCOUNTER — Encounter (HOSPITAL_COMMUNITY): Payer: Self-pay

## 2023-05-21 ENCOUNTER — Emergency Department (HOSPITAL_COMMUNITY): Payer: Commercial Managed Care - HMO

## 2023-05-21 ENCOUNTER — Other Ambulatory Visit: Payer: Self-pay

## 2023-05-21 ENCOUNTER — Inpatient Hospital Stay (HOSPITAL_COMMUNITY)
Admission: EM | Admit: 2023-05-21 | Discharge: 2023-05-24 | DRG: 371 | Disposition: A | Discharge status: Home / Self Care | Payer: Commercial Managed Care - HMO | Attending: Internal Medicine | Admitting: Internal Medicine

## 2023-05-21 DIAGNOSIS — K567 Ileus, unspecified: Secondary | ICD-10-CM | POA: Diagnosis present

## 2023-05-21 DIAGNOSIS — K631 Perforation of intestine (nontraumatic): Secondary | ICD-10-CM | POA: Diagnosis not present

## 2023-05-21 DIAGNOSIS — I10 Essential (primary) hypertension: Secondary | ICD-10-CM | POA: Diagnosis present

## 2023-05-21 DIAGNOSIS — K651 Peritoneal abscess: Principal | ICD-10-CM | POA: Diagnosis present

## 2023-05-21 DIAGNOSIS — K56609 Unspecified intestinal obstruction, unspecified as to partial versus complete obstruction: Secondary | ICD-10-CM | POA: Diagnosis present

## 2023-05-21 DIAGNOSIS — Z7984 Long term (current) use of oral hypoglycemic drugs: Secondary | ICD-10-CM

## 2023-05-21 DIAGNOSIS — Z79899 Other long term (current) drug therapy: Secondary | ICD-10-CM | POA: Diagnosis not present

## 2023-05-21 DIAGNOSIS — Z885 Allergy status to narcotic agent status: Secondary | ICD-10-CM | POA: Diagnosis not present

## 2023-05-21 DIAGNOSIS — F17210 Nicotine dependence, cigarettes, uncomplicated: Secondary | ICD-10-CM | POA: Diagnosis present

## 2023-05-21 DIAGNOSIS — E785 Hyperlipidemia, unspecified: Secondary | ICD-10-CM | POA: Diagnosis present

## 2023-05-21 DIAGNOSIS — E119 Type 2 diabetes mellitus without complications: Secondary | ICD-10-CM | POA: Diagnosis present

## 2023-05-21 LAB — URINALYSIS, ROUTINE W REFLEX MICROSCOPIC
Bilirubin Urine: NEGATIVE
Glucose, UA: NEGATIVE mg/dL
Ketones, ur: NEGATIVE mg/dL
Nitrite: NEGATIVE
Protein, ur: NEGATIVE mg/dL
Specific Gravity, Urine: 1.033 — ABNORMAL HIGH (ref 1.005–1.030)
pH: 5 (ref 5.0–8.0)

## 2023-05-21 LAB — COMPREHENSIVE METABOLIC PANEL
ALT: 24 U/L (ref 0–44)
AST: 27 U/L (ref 15–41)
Albumin: 2.7 g/dL — ABNORMAL LOW (ref 3.5–5.0)
Alkaline Phosphatase: 74 U/L (ref 38–126)
Anion gap: 14 (ref 5–15)
BUN: 23 mg/dL (ref 8–23)
CO2: 24 mmol/L (ref 22–32)
Calcium: 8.4 mg/dL — ABNORMAL LOW (ref 8.9–10.3)
Chloride: 95 mmol/L — ABNORMAL LOW (ref 98–111)
Creatinine, Ser: 1.09 mg/dL (ref 0.61–1.24)
GFR, Estimated: >60 mL/min (ref >60)
Glucose, Bld: 210 mg/dL — ABNORMAL HIGH (ref 70–99)
Potassium: 4.2 mmol/L (ref 3.5–5.1)
Sodium: 133 mmol/L — ABNORMAL LOW (ref 135–145)
Total Bilirubin: 2 mg/dL — ABNORMAL HIGH (ref 0.3–1.2)
Total Protein: 7.4 g/dL (ref 6.5–8.1)

## 2023-05-21 LAB — CBC
HCT: 43.8 % (ref 39.0–52.0)
Hemoglobin: 15.6 g/dL (ref 13.0–17.0)
MCH: 29.3 pg (ref 26.0–34.0)
MCHC: 35.6 g/dL (ref 30.0–36.0)
MCV: 82.3 fL (ref 80.0–100.0)
Platelets: 363 10*3/uL (ref 150–400)
RBC: 5.32 MIL/uL (ref 4.22–5.81)
RDW: 13.7 % (ref 11.5–15.5)
WBC: 8.7 10*3/uL (ref 4.0–10.5)
nRBC: 0 % (ref 0.0–0.2)

## 2023-05-21 LAB — GLUCOSE, CAPILLARY
Glucose-Capillary: 143 mg/dL — ABNORMAL HIGH (ref 70–99)
Glucose-Capillary: 171 mg/dL — ABNORMAL HIGH (ref 70–99)

## 2023-05-21 LAB — LIPASE, BLOOD: Lipase: 29 U/L (ref 11–51)

## 2023-05-21 MED ORDER — ONDANSETRON HCL 4 MG/2ML IJ SOLN
4.0000 mg | Freq: Once | INTRAMUSCULAR | Status: AC
  Administered 2023-05-21: 4 mg via INTRAVENOUS
  Filled 2023-05-21: qty 2

## 2023-05-21 MED ORDER — SODIUM CHLORIDE 0.9 % IV SOLN: INTRAVENOUS | Status: AC

## 2023-05-21 MED ORDER — AMLODIPINE BESYLATE 10 MG PO TABS
10.0000 mg | ORAL_TABLET | Freq: Every day | ORAL | Status: DC
  Administered 2023-05-23 – 2023-05-24 (×2): 10 mg via ORAL
  Filled 2023-05-21 (×2): qty 1

## 2023-05-21 MED ORDER — METRONIDAZOLE 500 MG/100ML IV SOLN
500.0000 mg | Freq: Once | INTRAVENOUS | Status: AC
  Administered 2023-05-21: 500 mg via INTRAVENOUS
  Filled 2023-05-21: qty 100

## 2023-05-21 MED ORDER — HYDROMORPHONE HCL 1 MG/ML IJ SOLN: 0.5000 mg | INTRAMUSCULAR | Status: DC | PRN

## 2023-05-21 MED ORDER — SODIUM CHLORIDE 0.9% FLUSH
3.0000 mL | Freq: Two times a day (BID) | INTRAVENOUS | Status: DC
  Administered 2023-05-22 – 2023-05-24 (×4): 3 mL via INTRAVENOUS

## 2023-05-21 MED ORDER — METRONIDAZOLE 500 MG/100ML IV SOLN
500.0000 mg | Freq: Two times a day (BID) | INTRAVENOUS | Status: DC
  Administered 2023-05-22 – 2023-05-24 (×5): 500 mg via INTRAVENOUS
  Filled 2023-05-21 (×5): qty 100

## 2023-05-21 MED ORDER — LACTATED RINGERS IV BOLUS
1000.0000 mL | Freq: Once | INTRAVENOUS | Status: AC
  Administered 2023-05-21: 1000 mL via INTRAVENOUS

## 2023-05-21 MED ORDER — ALUM & MAG HYDROXIDE-SIMETH 200-200-20 MG/5ML PO SUSP
30.0000 mL | ORAL | Status: DC | PRN
  Administered 2023-05-21 – 2023-05-23 (×3): 30 mL via ORAL
  Filled 2023-05-21 (×3): qty 30

## 2023-05-21 MED ORDER — SODIUM CHLORIDE 0.9 % IV SOLN
2.0000 g | INTRAVENOUS | Status: DC
  Administered 2023-05-22 – 2023-05-24 (×3): 2 g via INTRAVENOUS
  Filled 2023-05-21 (×3): qty 20

## 2023-05-21 MED ORDER — PRAVASTATIN SODIUM 10 MG PO TABS
20.0000 mg | ORAL_TABLET | Freq: Every day | ORAL | Status: DC
  Administered 2023-05-23 – 2023-05-24 (×2): 20 mg via ORAL
  Filled 2023-05-21 (×2): qty 2

## 2023-05-21 MED ORDER — SODIUM CHLORIDE 0.9 % IV SOLN
1.0000 g | Freq: Once | INTRAVENOUS | Status: AC
  Administered 2023-05-21: 1 g via INTRAVENOUS
  Filled 2023-05-21: qty 10

## 2023-05-21 MED ORDER — IOHEXOL 350 MG/ML SOLN
75.0000 mL | Freq: Once | INTRAVENOUS | Status: AC | PRN
  Administered 2023-05-21: 75 mL via INTRAVENOUS

## 2023-05-21 MED ORDER — INSULIN ASPART 100 UNIT/ML IJ SOLN
0.0000 [IU] | INTRAMUSCULAR | Status: DC
  Administered 2023-05-21: 1 [IU] via SUBCUTANEOUS
  Administered 2023-05-21: 2 [IU] via SUBCUTANEOUS
  Administered 2023-05-22: 3 [IU] via SUBCUTANEOUS
  Administered 2023-05-22: 2 [IU] via SUBCUTANEOUS
  Administered 2023-05-22 (×3): 1 [IU] via SUBCUTANEOUS
  Administered 2023-05-23: 2 [IU] via SUBCUTANEOUS
  Administered 2023-05-23: 1 [IU] via SUBCUTANEOUS

## 2023-05-21 NOTE — ED Provider Notes (Signed)
   ED Course / MDM   Clinical Course as of 05/21/23 1726  Sat May 21, 2023  1359 Lipase: 29 Neg [HN]  1359 CBC wnl [HN]  1453 On my wet read of CT abd pelvis, appears with SBO. Will consult to gen surgery. [HN]  1504 CT ABDOMEN PELVIS W CONTRAST 1. Sigmoid colon bowel wall thickening may reflect a colonic mass or focal diverticulitis. Multiple rim enhancing fluid collections in the abdomen or pelvis likely reflect abscesses and suggest bowel perforation due to the colonic mass/focal diverticulitis. 2. Multiple loops of dilated small bowel with a possible transition to decompressed small bowel in the lower right abdomen. This may reflect ileus or small-bowel obstruction and is likely related to the multiple abdominal abscesses/intraperitoneal inflammatory process.   [HN]  1505 Multiple possible intraabdominal abscesses and c/f perforated bowel. Will start on rocephin/flagyl. [HN]  1601 Received sign out from Dr. Jearld Fenton pending surgical consult and admission. Has multiple intraabdominal abscess, possible obstruction. Seems arising from possible focal diverticulitis or bowel mass [WS]  1615 Discussed with Dr. Derrell Lolling with surgery, who will consult. Requests hospital admission. Will consult hospitalist.  [WS]  1723 Signed out to Dr. Alinda Money who will admit patient for intraabdominal abscess [WS]    Clinical Course User Index [HN] Loetta Rough, MD [WS] Lonell Grandchild, MD   Medical Decision Making Amount and/or Complexity of Data Reviewed Labs: ordered. Decision-making details documented in ED Course. Radiology: ordered. Decision-making details documented in ED Course.  Risk Prescription drug management. Decision regarding hospitalization.   1. Intra-abdominal abscess (HCC)  2. SBO (small bowel obstruction) (HCC)  3. Intestinal perforation (HCC)         Lonell Grandchild, MD 05/21/23 1726

## 2023-05-21 NOTE — ED Notes (Addendum)
ED TO INPATIENT HANDOFF REPORT  ED Nurse Name and Phone #: / [phone]  S Name/Age/Gender Duane [name] 62 y.o. male Room/Bed: 029C/029C  Code Status   Code Status: Full Code  Home/SNF/Other Home Patient oriented to: self, place, time, and situation Is this baseline? Yes   Triage Complete: Triage complete  Chief Complaint Intra-abdominal abscess (HCC) [K65.1]  Triage Note Pt arrived POV from home c/o generalized abdominal pain that feels like gas pains. Pt states it started last week he was seen and it got better but then came back for the last couple days. Pt denies any N/V since last week.    Allergies Allergies  Allergen Reactions   Codeine Other (See Comments)    "I get jittery."     Level of Care/Admitting Diagnosis ED Disposition     ED Disposition  Admit   Condition  --   Comment  Hospital Area: [facility] [[id number]]  Level of Care: Telemetry Surgical [105]  May admit patient to [facility] or [facility] if equivalent level of care is available:: No  Covid Evaluation: Asymptomatic - no recent exposure (last 10 days) testing not required  Diagnosis: Intra-abdominal abscess [facility]) [[id number]]  Admitting Physician: Synetta Fail [[id number]]  Attending Physician: Synetta Fail [phone]  Certification:: I certify this patient will need inpatient services for at least 2 midnights  Estimated Length of Stay: 3          B Medical/Surgery History Past Medical History:  Diagnosis Date   Diabetes mellitus without complication (HCC)    Hypertension    History reviewed. No pertinent surgical history.   A IV Location/Drains/Wounds Patient Lines/Drains/Airways Status     Active Line/Drains/Airways     Name Placement date Placement time Site Days   Peripheral IV 05/21/23 20 G Left Antecubital 05/21/23  1357  Antecubital  less than 1            Intake/Output Last 24 hours  Intake/Output Summary (Last 24 hours) at  05/21/2023 1752 Last data filed at 05/21/2023 1728 Gross per 24 hour  Intake 1100.36 ml  Output --  Net 1100.36 ml    Labs/Imaging Results for orders placed or performed during the hospital encounter of 05/21/23 (from the past 48 hour(s))  Lipase, blood     Status: None   Collection Time: 05/21/23 11:53 AM  Result Value Ref Range   Lipase 29 11 - 51 U/L    Comment: Performed at [facility], [address], [place], [place] [address]  Comprehensive metabolic panel     Status: Abnormal   Collection Time: 05/21/23 11:53 AM  Result Value Ref Range   Sodium 133 (L) 135 - 145 mmol/L   Potassium 4.2 3.5 - 5.1 mmol/L    Comment: HEMOLYSIS AT THIS LEVEL MAY AFFECT RESULT   Chloride 95 (L) 98 - 111 mmol/L   CO2 24 22 - 32 mmol/L   Glucose, Bld 210 (H) 70 - 99 mg/dL    Comment: Glucose reference range applies only to samples taken after fasting for at least 8 hours.   BUN 23 8 - 23 mg/dL   Creatinine, Ser 9.32 0.61 - 1.24 mg/dL   Calcium 8.4 (L) 8.9 - 10.3 mg/dL   Total Protein 7.4 6.5 - 8.1 g/dL   Albumin 2.7 (L) 3.5 - 5.0 g/dL   AST 27 15 - 41 U/L    Comment: HEMOLYSIS AT THIS LEVEL MAY AFFECT RESULT   ALT 24 0 -  44 U/L    Comment: HEMOLYSIS AT THIS LEVEL MAY AFFECT RESULT   Alkaline Phosphatase 74 38 - 126 U/L   Total Bilirubin 2.0 (H) 0.3 - 1.2 mg/dL    Comment: HEMOLYSIS AT THIS LEVEL MAY AFFECT RESULT   GFR, Estimated >60 >60 mL/min    Comment: (NOTE) Calculated using the CKD-EPI Creatinine Equation (2021)    Anion gap 14 5 - 15    Comment: Performed at [facility], [address], [place], [place] [address]  CBC     Status: None   Collection Time: 05/21/23 11:53 AM  Result Value Ref Range   WBC 8.7 4.0 - 10.5 K/uL   RBC 5.32 4.22 - 5.81 MIL/uL   Hemoglobin 15.6 13.0 - 17.0 g/dL   HCT 65.7 84.6 - 96.2 %   MCV 82.3 80.0 - 100.0 fL   MCH 29.3 26.0 - 34.0 pg   MCHC 35.6 30.0 - 36.0 g/dL   RDW 95.2 84.1 - 32.4 %   Platelets 363 150 - 400 K/uL   nRBC 0.0 0.0 -  0.2 %    Comment: Performed at [facility], [address] [place], [place] [address]  Urinalysis, Routine w reflex microscopic -Urine, Clean Catch     Status: Abnormal   Collection Time: 05/21/23  2:24 PM  Result Value Ref Range   Color, Urine YELLOW YELLOW   APPearance CLEAR CLEAR   Specific Gravity, Urine 1.033 (H) 1.005 - 1.030   pH 5.0 5.0 - 8.0   Glucose, UA NEGATIVE NEGATIVE mg/dL   Hgb urine dipstick SMALL (A) NEGATIVE   Bilirubin Urine NEGATIVE NEGATIVE   Ketones, ur NEGATIVE NEGATIVE mg/dL   Protein, ur NEGATIVE NEGATIVE mg/dL   Nitrite NEGATIVE NEGATIVE   Leukocytes,Ua TRACE (A) NEGATIVE   RBC / HPF 0-5 0 - 5 RBC/hpf   WBC, UA 6-10 0 - 5 WBC/hpf   Bacteria, UA RARE (A) NONE SEEN   Squamous Epithelial / HPF 0-5 0 - 5 /HPF    Comment: Performed at [facility], [address] [place], [place] [address]   CT ABDOMEN PELVIS W CONTRAST  Result Date: 05/21/2023 CLINICAL DATA:  Generalized abdominal pain. EXAM: CT ABDOMEN AND PELVIS WITH CONTRAST TECHNIQUE: Multidetector CT imaging of the abdomen and pelvis was performed using the standard protocol following bolus administration of intravenous contrast. RADIATION DOSE REDUCTION: This exam was performed according to the departmental dose-optimization program which includes automated exposure control, adjustment of the mA and/or kV according to patient size and/or use of iterative reconstruction technique. CONTRAST:  75mL OMNIPAQUE IOHEXOL 350 MG/ML SOLN COMPARISON:  None Available. FINDINGS: Lower chest: No acute abnormality. Hepatobiliary: No focal liver abnormality is seen. No gallstones, gallbladder wall thickening, or biliary dilatation. Pancreas: Unremarkable. No pancreatic ductal dilatation or surrounding inflammatory changes. Spleen: Normal in size without focal abnormality. Adrenals/Urinary Tract: Adrenal glands are unremarkable. Kidneys are normal, without renal calculi, focal lesion, or hydronephrosis. Bladder is  unremarkable. Stomach/Bowel: Stomach is within normal limits. Appendix appears normal. There is a multi loculated fluid collection in the lower mid abdomen with multiple additional fluid collections which may reflect developing abscesses. For reference, the multiloculated collection along the superior aspect of the bladder measures approximately 7.7 x 8.2 cm (series 3, image 74). A rim enhancing fluid collection in the pelvis measures approximately 9.7 x 4.7 cm (series 7, image 64). There is focal bowel wall thickening of the sigmoid colon, which may represent a colonic mass or  focal diverticulitis, and is in close proximity to the multiloculated collection (series 3, images 70-74). There are multiple loops of dilated small bowel with a possible transition to decompressed small bowel in the lower right abdomen. Vascular/Lymphatic: Aortic atherosclerosis. No enlarged abdominal or pelvic lymph nodes. Reproductive: Prostate is unremarkable. Other: A mass in the inferior anterior abdominal wall in the subcutaneous fat and abutting the skin surface measures 2.6 x 1.0 cm (series 6, image 93). No abdominal wall hernia. Musculoskeletal: Degenerative changes are seen in the spine. IMPRESSION: 1. Sigmoid colon bowel wall thickening may reflect a colonic mass or focal diverticulitis. Multiple rim enhancing fluid collections in the abdomen or pelvis likely reflect abscesses and suggest bowel perforation due to the colonic mass/focal diverticulitis. 2. Multiple loops of dilated small bowel with a possible transition to decompressed small bowel in the lower right abdomen. This may reflect ileus or small-bowel obstruction and is likely related to the multiple abdominal abscesses/intraperitoneal inflammatory process. Aortic Atherosclerosis (ICD10-I70.0). Electronically Signed   By: Romona Curls M.D.   On: 05/21/2023 15:00    Pending Labs Unresulted Labs (From admission, onward)     Start     Ordered   05/22/23 0500   Comprehensive metabolic panel  Tomorrow morning,   R        05/21/23 1723   05/22/23 0500  CBC  Tomorrow morning,   R        05/21/23 1723            Vitals/Pain Today's Vitals   05/21/23 1141 05/21/23 1144 05/21/23 1430 05/21/23 1530  BP: (!) 168/91  (!) 145/91 (!) 146/81  Pulse: 98  76 74  Resp: 18  18 16   Temp: 98 F (36.7 C)   98.5 F (36.9 C)  TempSrc:    Oral  SpO2: 99%  100% 100%  Weight:  91.6 kg    Height:  6\' 2"  (1.88 m)    PainSc:  2       Isolation Precautions No active isolations  Medications Medications  metroNIDAZOLE (FLAGYL) IVPB 500 mg (500 mg Intravenous New Bag/Given 05/21/23 1745)  metroNIDAZOLE (FLAGYL) IVPB 500 mg (has no administration in time range)  cefTRIAXone (ROCEPHIN) 2 g in sodium chloride 0.9 % 100 mL IVPB (has no administration in time range)  amLODipine (NORVASC) tablet 10 mg (has no administration in time range)  pravastatin (PRAVACHOL) tablet 20 mg (has no administration in time range)  sodium chloride flush (NS) 0.9 % injection 3 mL (has no administration in time range)  HYDROmorphone (DILAUDID) injection 0.5 mg (has no administration in time range)  0.9 %  sodium chloride infusion ( Intravenous New Bag/Given 05/21/23 1744)  lactated ringers bolus 1,000 mL (0 mLs Intravenous Stopped 05/21/23 1634)  ondansetron (ZOFRAN) injection 4 mg (4 mg Intravenous Given 05/21/23 1426)  iohexol (OMNIPAQUE) 350 MG/ML injection 75 mL (75 mLs Intravenous Contrast Given 05/21/23 1421)  cefTRIAXone (ROCEPHIN) 1 g in sodium chloride 0.9 % 100 mL IVPB (0 g Intravenous Stopped 05/21/23 1728)    Mobility walks     Focused Assessments     R Recommendations: See Admitting Provider Note  Report given to:   Additional Notes: Pt came in for abd pain. He's being admitted for an abscess. He has a 20 G in the L AC. He got rocephin, zofran, flagyl, and Na Cl going at 125. Wife is at bedside.

## 2023-05-21 NOTE — ED Triage Notes (Signed)
Pt arrived POV from home c/o generalized abdominal pain that feels like gas pains. Pt states it started last week he was seen and it got better but then came back for the last couple days. Pt denies any N/V since last week.

## 2023-05-21 NOTE — ED Provider Notes (Signed)
[place] EMERGENCY DEPARTMENT AT [facility] Provider Note   CSN: [id number] Arrival date & time: 05/21/23  1125     History  Chief Complaint  Patient presents with   Abdominal Pain    Duane [name] is a 62 y.o. male with PMH as listed below who presents with Abdominal pain that began last week.  He states this is intermittent and sometimes rated 10 out of 10.  Currently is rated 2 out of 10.  It is located in his bilateral lower quadrants and in the middle.  He denies any urinary symptoms, fever/chills, chest pain, shortness of breath.  He did have some nausea vomiting last week but that is subsided.  He now does not feel like eating anything and has had very poor p.o. intake.  He endorses constipation with intermittent bouts of diarrhea that is nonbloody.  Yesterday he tried an enema and did have a small bowel movement.  He has not tried anything for pain.  No history of SBO or abdominal surgery..    Past Medical History:  Diagnosis Date   Diabetes mellitus without complication (HCC)    Hypertension        Home Medications Prior to Admission medications   Medication Sig Start Date End Date Taking? Authorizing Provider  amLODipine (NORVASC) 10 MG tablet Take 1 tablet (10 mg total) by mouth daily. 09/07/22   [name], NP  hydrochlorothiazide (HYDRODIURIL) 25 MG tablet Take 1 tablet (25 mg total) by mouth daily. 09/07/22   [name], NP  metFORMIN (GLUCOPHAGE) 500 MG tablet Take 1 tablet (500 mg total) by mouth 2 (two) times daily with a meal. 09/07/22   [name], NP  pravastatin (PRAVACHOL) 20 MG tablet Take 1 tablet by mouth once daily 04/05/23   [name], NP      Allergies    Codeine    Review of Systems   Review of Systems A 10 point review of systems was performed and is negative unless otherwise reported in HPI.  Physical Exam Updated Vital Signs BP (!) 146/81 (BP Location: Right Arm)   Pulse 74   Temp 98.5 F (36.9  C) (Oral)   Resp 16   Ht 6\' 2"  (1.88 m)   Wt 91.6 kg   SpO2 100%   BMI 25.94 kg/m  Physical Exam General: Normal appearing male, lying in bed.  HEENT: Sclera anicteric, dry mucous membranes, trachea midline.  Cardiology: RRR, no murmurs/rubs/gallops.  Resp: Normal respiratory rate and effort. CTAB, no wheezes, rhonchi, crackles.  Abd: Soft, tender to palpation in bilateral lower quadrants as well as suprapubic region.  Abdomen feels mildly distended.  No rebound tenderness or guarding.  GU: Deferred. MSK: No peripheral edema or signs of trauma.  Skin: warm, dry.  Back: No CVA tenderness Neuro: A&Ox4, CNs II-XII grossly intact. MAEs. Sensation grossly intact.  Psych: Normal mood and affect.   ED Results / Procedures / Treatments   Labs (all labs ordered are listed, but only abnormal results are displayed) Labs Reviewed  COMPREHENSIVE METABOLIC PANEL - Abnormal; Notable for the following components:      Result Value   Sodium 133 (*)    Chloride 95 (*)    Glucose, Bld 210 (*)    Calcium 8.4 (*)    Albumin 2.7 (*)    Total Bilirubin 2.0 (*)    All other components within normal limits  URINALYSIS, ROUTINE W REFLEX MICROSCOPIC - Abnormal; Notable for the following  components:   Specific Gravity, Urine 1.033 (*)    Hgb urine dipstick SMALL (*)    Leukocytes,Ua TRACE (*)    Bacteria, UA RARE (*)    All other components within normal limits  LIPASE, BLOOD  CBC    EKG None  Radiology CT ABDOMEN PELVIS W CONTRAST  Result Date: 05/21/2023 CLINICAL DATA:  Generalized abdominal pain. EXAM: CT ABDOMEN AND PELVIS WITH CONTRAST TECHNIQUE: Multidetector CT imaging of the abdomen and pelvis was performed using the standard protocol following bolus administration of intravenous contrast. RADIATION DOSE REDUCTION: This exam was performed according to the departmental dose-optimization program which includes automated exposure control, adjustment of the mA and/or kV according to patient  size and/or use of iterative reconstruction technique. CONTRAST:  75mL OMNIPAQUE IOHEXOL 350 MG/ML SOLN COMPARISON:  None Available. FINDINGS: Lower chest: No acute abnormality. Hepatobiliary: No focal liver abnormality is seen. No gallstones, gallbladder wall thickening, or biliary dilatation. Pancreas: Unremarkable. No pancreatic ductal dilatation or surrounding inflammatory changes. Spleen: Normal in size without focal abnormality. Adrenals/Urinary Tract: Adrenal glands are unremarkable. Kidneys are normal, without renal calculi, focal lesion, or hydronephrosis. Bladder is unremarkable. Stomach/Bowel: Stomach is within normal limits. Appendix appears normal. There is a multi loculated fluid collection in the lower mid abdomen with multiple additional fluid collections which may reflect developing abscesses. For reference, the multiloculated collection along the superior aspect of the bladder measures approximately 7.7 x 8.2 cm (series 3, image 74). A rim enhancing fluid collection in the pelvis measures approximately 9.7 x 4.7 cm (series 7, image 64). There is focal bowel wall thickening of the sigmoid colon, which may represent a colonic mass or focal diverticulitis, and is in close proximity to the multiloculated collection (series 3, images 70-74). There are multiple loops of dilated small bowel with a possible transition to decompressed small bowel in the lower right abdomen. Vascular/Lymphatic: Aortic atherosclerosis. No enlarged abdominal or pelvic lymph nodes. Reproductive: Prostate is unremarkable. Other: A mass in the inferior anterior abdominal wall in the subcutaneous fat and abutting the skin surface measures 2.6 x 1.0 cm (series 6, image 93). No abdominal wall hernia. Musculoskeletal: Degenerative changes are seen in the spine. IMPRESSION: 1. Sigmoid colon bowel wall thickening may reflect a colonic mass or focal diverticulitis. Multiple rim enhancing fluid collections in the abdomen or pelvis likely  reflect abscesses and suggest bowel perforation due to the colonic mass/focal diverticulitis. 2. Multiple loops of dilated small bowel with a possible transition to decompressed small bowel in the lower right abdomen. This may reflect ileus or small-bowel obstruction and is likely related to the multiple abdominal abscesses/intraperitoneal inflammatory process. Aortic Atherosclerosis (ICD10-I70.0). Electronically Signed   By: Romona Curls M.D.   On: 05/21/2023 15:00    Procedures Procedures    Medications Ordered in ED Medications  cefTRIAXone (ROCEPHIN) 1 g in sodium chloride 0.9 % 100 mL IVPB (has no administration in time range)  metroNIDAZOLE (FLAGYL) IVPB 500 mg (has no administration in time range)  lactated ringers bolus 1,000 mL (1,000 mLs Intravenous New Bag/Given 05/21/23 1426)  ondansetron (ZOFRAN) injection 4 mg (4 mg Intravenous Given 05/21/23 1426)  iohexol (OMNIPAQUE) 350 MG/ML injection 75 mL (75 mLs Intravenous Contrast Given 05/21/23 1421)    ED Course/ Medical Decision Making/ A&P                          Medical Decision Making Amount and/or Complexity of Data Reviewed Labs: ordered. Decision-making details documented  in ED Course. Radiology: ordered. Decision-making details documented in ED Course.  Risk Prescription drug management. Decision regarding hospitalization.    This patient presents to the ED for concern of abd pain, decreased PO intake, constipation; this involves an extensive number of treatment options, and is a complaint that carries with it a high risk of complications and morbidity.  I considered the following differential and admission for this acute, potentially life threatening condition.   MDM:    For DDX for abdominal pain includes but is not limited to:  Abdominal exam without peritoneal signs. No evidence of acute abdomen at this time.  Greatest concern for small bowel obstruction given patient's abdominal distention, constipation with  intermittent diarrhea, nausea vomiting and decreased p.o. intake.  Will obtain a CT Abdo pelvis for further evaluation.  Also considered but lower suspicion for acute hepatobiliary disease (including acute cholecystitis or cholangitis), acute pancreatitis (neg lipase), PUD (including gastric perforation), acute appendicitis, vascular catastrophe, or diverticulitis.  Consider electrolyte derangements, dehydration, renal injury due to decreased p.o. intake.  Patient Is hemodynamically stable on exam but has dry mucous membranes and will fluid resuscitate with antiemetics.   Clinical Course as of 05/21/23 1605  Sat May 21, 2023  1359 Lipase: 29 Neg [HN]  1359 CBC wnl [HN]  1453 On my wet read of CT abd pelvis, appears with SBO. Will consult to gen surgery. [HN]  1504 CT ABDOMEN PELVIS W CONTRAST 1. Sigmoid colon bowel wall thickening may reflect a colonic mass or focal diverticulitis. Multiple rim enhancing fluid collections in the abdomen or pelvis likely reflect abscesses and suggest bowel perforation due to the colonic mass/focal diverticulitis. 2. Multiple loops of dilated small bowel with a possible transition to decompressed small bowel in the lower right abdomen. This may reflect ileus or small-bowel obstruction and is likely related to the multiple abdominal abscesses/intraperitoneal inflammatory process.   [HN]  1505 Multiple possible intraabdominal abscesses and c/f perforated bowel. Will start on rocephin/flagyl. [HN]  1601 Received sign out from Dr. Jearld Fenton pending surgical consult and admission. Has multiple intraabdominal abscess, possible obstruction. Seems arising from possible focal diverticulitis or bowel mass [WS]    Clinical Course User Index [HN] Loetta Rough, MD [WS] Lonell Grandchild, MD    Labs: I Ordered, and personally interpreted labs.  The pertinent results include:  those listed above  Imaging Studies ordered: I ordered imaging studies including CT abd  pelvis I independently visualized and interpreted imaging. I agree with the radiologist interpretation  Additional history obtained from chart review.    Reevaluation: After the interventions noted above, I reevaluated the patient and found that they have :improved  Social Determinants of Health: Li ves independently  Disposition:  Signed out to oncoming physician Dr. Suezanne Jacquet pending surgery consult and likely medical admission.   Co morbidities that complicate the patient evaluation  Past Medical History:  Diagnosis Date   Diabetes mellitus without complication (HCC)    Hypertension      Medicines Meds ordered this encounter  Medications   lactated ringers bolus 1,000 mL   ondansetron (ZOFRAN) injection 4 mg   iohexol (OMNIPAQUE) 350 MG/ML injection 75 mL   cefTRIAXone (ROCEPHIN) 1 g in sodium chloride 0.9 % 100 mL IVPB    Order Specific Question:   Antibiotic Indication:    Answer:   Intra-abdominal   metroNIDAZOLE (FLAGYL) IVPB 500 mg    Order Specific Question:   Antibiotic Indication:    Answer:   Intra-abdominal Infection  I have reviewed the patients home medicines and have made adjustments as needed  Problem List / ED Course: Problem List Items Addressed This Visit   None Visit Diagnoses     Intra-abdominal abscess (HCC)    -  Primary   SBO (small bowel obstruction) (HCC)       Intestinal perforation (HCC)                       This note was created using dictation software, which may contain spelling or grammatical errors.    Loetta Rough, MD 05/21/23 [phone]

## 2023-05-21 NOTE — Consult Note (Signed)
Reason for Consult: Abdominal pain   Referring Physician: Dr. Harland Dingwall is an 62 y.o. male.  HPI: Patient is a 62 year old male who comes in secondary to abdominal pain for approximate 1 week.  He states that pain has been on and off over the last week or so.  He states he had some obstipation over the last week as well.  He had some nausea and vomiting.  He states that he has had increasing pain in his abdomen and this is what brought him to the ER.  He states last bowel movement was approximately last Saturday.  He states he recently used an enema to help with having a bowel movement however this was unsuccessful.  Patient states he is never had a previous colonoscopy but had one scheduled for next month.   Patient denies any previous history of pain similar to this.  Upon evaluation in the ER underwent CT scan laboratory studies.  Patient without any leukocytosis.  Patient's CT scan which I reviewed personally shows fluid pockets that could be due to contained perforation of diverticulitis versus possible mass.  General surgery was consulted for further evaluation.  Past Medical History:  Diagnosis Date   Diabetes mellitus without complication (HCC)    Hypertension     History reviewed. No pertinent surgical history.  Family History  Problem Relation Age of Onset   Colon cancer Neg Hx    Colon polyps Neg Hx    Rectal cancer Neg Hx    Stomach cancer Neg Hx    Esophageal cancer Neg Hx     Social History:  reports that he has been smoking cigarettes. He has never used smokeless tobacco. He reports that he does not currently use alcohol. He reports that he does not use drugs.  Allergies:  Allergies  Allergen Reactions   Codeine Other (See Comments)    "I get jittery."     Medications: I have reviewed the patient's current medications.  Results for orders placed or performed during the hospital encounter of 05/21/23 (from the past 48 hour(s))  Lipase, blood      Status: None   Collection Time: 05/21/23 11:53 AM  Result Value Ref Range   Lipase 29 11 - 51 U/L    Comment: Performed at [facility], [address] [place], [place] [address]  Comprehensive metabolic panel     Status: Abnormal   Collection Time: 05/21/23 11:53 AM  Result Value Ref Range   Sodium 133 (L) 135 - 145 mmol/L   Potassium 4.2 3.5 - 5.1 mmol/L    Comment: HEMOLYSIS AT THIS LEVEL MAY AFFECT RESULT   Chloride 95 (L) 98 - 111 mmol/L   CO2 24 22 - 32 mmol/L   Glucose, Bld 210 (H) 70 - 99 mg/dL    Comment: Glucose reference range applies only to samples taken after fasting for at least 8 hours.   BUN 23 8 - 23 mg/dL   Creatinine, Ser 5.36 0.61 - 1.24 mg/dL   Calcium 8.4 (L) 8.9 - 10.3 mg/dL   Total Protein 7.4 6.5 - 8.1 g/dL   Albumin 2.7 (L) 3.5 - 5.0 g/dL   AST 27 15 - 41 U/L    Comment: HEMOLYSIS AT THIS LEVEL MAY AFFECT RESULT   ALT 24 0 - 44 U/L    Comment: HEMOLYSIS AT THIS LEVEL MAY AFFECT RESULT   Alkaline Phosphatase 74 38 - 126 U/L   Total Bilirubin 2.0 (H) 0.3 - 1.2  mg/dL    Comment: HEMOLYSIS AT THIS LEVEL MAY AFFECT RESULT   GFR, Estimated >60 >60 mL/min    Comment: (NOTE) Calculated using the CKD-EPI Creatinine Equation (2021)    Anion gap 14 5 - 15    Comment: Performed at [facility], [address], [place], [place] [address]  CBC     Status: None   Collection Time: 05/21/23 11:53 AM  Result Value Ref Range   WBC 8.7 4.0 - 10.5 K/uL   RBC 5.32 4.22 - 5.81 MIL/uL   Hemoglobin 15.6 13.0 - 17.0 g/dL   HCT 25.3 66.4 - 40.3 %   MCV 82.3 80.0 - 100.0 fL   MCH 29.3 26.0 - 34.0 pg   MCHC 35.6 30.0 - 36.0 g/dL   RDW 47.4 25.9 - 56.3 %   Platelets 363 150 - 400 K/uL   nRBC 0.0 0.0 - 0.2 %    Comment: Performed at [facility], [address] [place], [place] [address]  Urinalysis, Routine w reflex microscopic -Urine, Clean Catch     Status: Abnormal   Collection Time: 05/21/23  2:24 PM  Result Value Ref Range   Color, Urine  YELLOW YELLOW   APPearance CLEAR CLEAR   Specific Gravity, Urine 1.033 (H) 1.005 - 1.030   pH 5.0 5.0 - 8.0   Glucose, UA NEGATIVE NEGATIVE mg/dL   Hgb urine dipstick SMALL (A) NEGATIVE   Bilirubin Urine NEGATIVE NEGATIVE   Ketones, ur NEGATIVE NEGATIVE mg/dL   Protein, ur NEGATIVE NEGATIVE mg/dL   Nitrite NEGATIVE NEGATIVE   Leukocytes,Ua TRACE (A) NEGATIVE   RBC / HPF 0-5 0 - 5 RBC/hpf   WBC, UA 6-10 0 - 5 WBC/hpf   Bacteria, UA RARE (A) NONE SEEN   Squamous Epithelial / HPF 0-5 0 - 5 /HPF    Comment: Performed at [facility], [address], [place], [place] [address]    CT ABDOMEN PELVIS W CONTRAST  Result Date: 05/21/2023 CLINICAL DATA:  Generalized abdominal pain. EXAM: CT ABDOMEN AND PELVIS WITH CONTRAST TECHNIQUE: Multidetector CT imaging of the abdomen and pelvis was performed using the standard protocol following bolus administration of intravenous contrast. RADIATION DOSE REDUCTION: This exam was performed according to the departmental dose-optimization program which includes automated exposure control, adjustment of the mA and/or kV according to patient size and/or use of iterative reconstruction technique. CONTRAST:  75mL OMNIPAQUE IOHEXOL 350 MG/ML SOLN COMPARISON:  None Available. FINDINGS: Lower chest: No acute abnormality. Hepatobiliary: No focal liver abnormality is seen. No gallstones, gallbladder wall thickening, or biliary dilatation. Pancreas: Unremarkable. No pancreatic ductal dilatation or surrounding inflammatory changes. Spleen: Normal in size without focal abnormality. Adrenals/Urinary Tract: Adrenal glands are unremarkable. Kidneys are normal, without renal calculi, focal lesion, or hydronephrosis. Bladder is unremarkable. Stomach/Bowel: Stomach is within normal limits. Appendix appears normal. There is a multi loculated fluid collection in the lower mid abdomen with multiple additional fluid collections which may reflect developing abscesses. For reference, the  multiloculated collection along the superior aspect of the bladder measures approximately 7.7 x 8.2 cm (series 3, image 74). A rim enhancing fluid collection in the pelvis measures approximately 9.7 x 4.7 cm (series 7, image 64). There is focal bowel wall thickening of the sigmoid colon, which may represent a colonic mass or focal diverticulitis, and is in close proximity to the multiloculated collection (series 3, images 70-74). There are multiple loops of dilated small bowel with a possible transition to decompressed small  bowel in the lower right abdomen. Vascular/Lymphatic: Aortic atherosclerosis. No enlarged abdominal or pelvic lymph nodes. Reproductive: Prostate is unremarkable. Other: A mass in the inferior anterior abdominal wall in the subcutaneous fat and abutting the skin surface measures 2.6 x 1.0 cm (series 6, image 93). No abdominal wall hernia. Musculoskeletal: Degenerative changes are seen in the spine. IMPRESSION: 1. Sigmoid colon bowel wall thickening may reflect a colonic mass or focal diverticulitis. Multiple rim enhancing fluid collections in the abdomen or pelvis likely reflect abscesses and suggest bowel perforation due to the colonic mass/focal diverticulitis. 2. Multiple loops of dilated small bowel with a possible transition to decompressed small bowel in the lower right abdomen. This may reflect ileus or small-bowel obstruction and is likely related to the multiple abdominal abscesses/intraperitoneal inflammatory process. Aortic Atherosclerosis (ICD10-I70.0). Electronically Signed   By: Romona Curls M.D.   On: 05/21/2023 15:00    Review of Systems  Constitutional:  Positive for appetite change. Negative for chills and fever.  HENT:  Negative for ear discharge, hearing loss and sore throat.   Eyes:  Negative for discharge.  Respiratory:  Negative for cough and shortness of breath.   Cardiovascular:  Negative for chest pain and leg swelling.  Gastrointestinal:  Positive for  abdominal pain, constipation, diarrhea, nausea and vomiting.  Musculoskeletal:  Negative for myalgias and neck pain.  Skin:  Negative for rash.  Allergic/Immunologic: Negative for environmental allergies.  Neurological:  Negative for dizziness and seizures.  Hematological:  Does not bruise/bleed easily.  Psychiatric/Behavioral:  Negative for suicidal ideas.   All other systems reviewed and are negative.  Blood pressure (!) 146/81, pulse 74, temperature 98.5 F (36.9 C), temperature source Oral, resp. rate 16, height 6\' 2"  (1.88 m), weight 91.6 kg, SpO2 100%. Physical Exam Constitutional:      Appearance: He is well-developed.     Comments: Conversant No acute distress  HENT:     Head: Normocephalic and atraumatic.  Eyes:     General: Lids are normal. No scleral icterus.    Pupils: Pupils are equal, round, and reactive to light.     Comments: Pupils are equal round and reactive No lid lag Moist conjunctiva  Neck:     Thyroid: No thyromegaly.     Trachea: No tracheal tenderness.     Comments: No cervical lymphadenopathy Cardiovascular:     Rate and Rhythm: Normal rate and regular rhythm.     Heart sounds: No murmur heard. Pulmonary:     Effort: Pulmonary effort is normal.     Breath sounds: Normal breath sounds. No wheezing or rales.  Abdominal:     General: Bowel sounds are decreased. There is distension.     Tenderness: There is no abdominal tenderness. There is no guarding or rebound.     Hernia: No hernia is present.  Musculoskeletal:     Cervical back: Normal range of motion and neck supple.  Skin:    General: Skin is warm.     Findings: No rash.     Nails: There is no clubbing.     Comments: Normal skin turgor  Neurological:     Mental Status: He is alert and oriented to person, place, and time.     Comments: Normal gait and station  Psychiatric:        Mood and Affect: Mood normal.        Thought Content: Thought content normal.        Judgment: Judgment  normal.  Comments: Appropriate affect     Assessment/Plan: 62 year old male with intra-abdominal fluid collections HTN DM  1.  Recommend IR percutaneous drainage.  This may be due to diverticulitis with possible contained perforation secondary to possible mass. 2.  Patient may require colonoscopy based on the above findings. 3.  At this point patient does not need any emergent surgery.  Will follow along with you.   Axel Filler 05/21/2023, 5:15 PM

## 2023-05-21 NOTE — H&P (Signed)
History and Physical   Duane [name] WUJ:[id number] DOB: October 09, 1961 DOA: 05/21/2023  PCP: [name], NP   Patient coming from: Home  Chief Complaint: Abdominal pain  HPI: Duane [name] is a 62 y.o. male with medical history significant of hypertension, diabetes presented with abdominal pain.  Patient reports about a week of intermittent abdominal pain.  States at its worst it was around 10 out of 10.  Had some nausea and vomiting last week but not at this time.  Has had decreased p.o. intake and reports some constipation with intermittent bouts of nonbloody diarrhea.  Denies fevers, chills, chest pain.  ED Course: Vital signs in the ED notable for blood pressure in the 140s to 160s systolic.  Lab workup included CMP with sodium 133, chloride 95, calcium 8.4, albumin 2.7, T. bili 2.0.  CBC within normal limits.  Lipase normal.  Urinalysis with hemoglobin, leukocytes, bacteria.  CT abdomen pelvis showed colonic wall thickening concerning for mass versus diverticulitis.  Multiple fluid collections suspicious for abscesses and raising suspicion for possible bowel perforation at some point.  Also noted to be evidence of SBO versus ileus.  General surgery consulted and are following.  Patient received ceftriaxone, Flagyl, IV fluids, Zofran in the ED.  Review of Systems: As per HPI otherwise all other systems reviewed and are negative.  Past Medical History:  Diagnosis Date   Diabetes mellitus without complication (HCC)    Hypertension     History reviewed. No pertinent surgical history.  Social History  reports that he has been smoking cigarettes. He has never used smokeless tobacco. He reports that he does not currently use alcohol. He reports that he does not use drugs.  Allergies  Allergen Reactions   Codeine Other (See Comments)    "I get jittery."     Family History  Problem Relation Age of Onset   Colon cancer Neg Hx    Colon polyps Neg Hx    Rectal cancer Neg Hx     Stomach cancer Neg Hx    Esophageal cancer Neg Hx   Reviewed on admission  Prior to Admission medications   Medication Sig Start Date End Date Taking? Authorizing Provider  amLODipine (NORVASC) 10 MG tablet Take 1 tablet (10 mg total) by mouth daily. 09/07/22  Yes [name], NP  hydrochlorothiazide (HYDRODIURIL) 25 MG tablet Take 1 tablet (25 mg total) by mouth daily. 09/07/22  Yes [name], NP  metFORMIN (GLUCOPHAGE) 500 MG tablet Take 1 tablet (500 mg total) by mouth 2 (two) times daily with a meal. 09/07/22  Yes [name], NP  pravastatin (PRAVACHOL) 20 MG tablet Take 1 tablet by mouth once daily 04/05/23  Yes Edwards, Michelle P, NP  Na Sulfate-K Sulfate-Mg Sulf 17.5-3.13-1.6 GM/177ML SOLN Take 354 mLs by mouth as directed. Patient not taking: Reported on 05/21/2023 05/18/23   [provider]    Physical Exam: Vitals:   05/21/23 1141 05/21/23 1144 05/21/23 1430 05/21/23 1530  BP: (!) 168/91  (!) 145/91 (!) 146/81  Pulse: 98  76 74  Resp: 18  18 16   Temp: 98 F (36.7 C)   98.5 F (36.9 C)  TempSrc:    Oral  SpO2: 99%  100% 100%  Weight:  91.6 kg    Height:  6\' 2"  (1.88 m)      Physical Exam Constitutional:      General: He is not in acute distress.    Appearance: Normal appearance.  HENT:  Head: Normocephalic and atraumatic.     Mouth/Throat:     Mouth: Mucous membranes are moist.     Pharynx: Oropharynx is clear.  Eyes:     Extraocular Movements: Extraocular movements intact.     Pupils: Pupils are equal, round, and reactive to light.  Cardiovascular:     Rate and Rhythm: Normal rate and regular rhythm.     Pulses: Normal pulses.     Heart sounds: Normal heart sounds.  Pulmonary:     Effort: Pulmonary effort is normal. No respiratory distress.     Breath sounds: Normal breath sounds.  Abdominal:     General: Bowel sounds are normal. There is no distension.     Palpations: Abdomen is soft.     Tenderness: There is abdominal  tenderness.  Musculoskeletal:        General: No swelling or deformity.  Skin:    General: Skin is warm and dry.  Neurological:     General: No focal deficit present.     Mental Status: Mental status is at baseline.    Labs on Admission: I have personally reviewed following labs and imaging studies  CBC: Recent Labs  Lab 05/16/23 1449 05/21/23 1153  WBC 7.6 8.7  HGB 15.4 15.6  HCT 44.3 43.8  MCV 83.9 82.3  PLT 250 363    Basic Metabolic Panel: Recent Labs  Lab 05/16/23 1449 05/21/23 1153  NA 137 133*  K 3.0* 4.2  CL 98 95*  CO2 23 24  GLUCOSE 249* 210*  BUN 11 23  CREATININE 1.31* 1.09  CALCIUM 9.3 8.4*    GFR: Estimated Creatinine Clearance: 82.7 mL/min (by C-G formula based on SCr of 1.09 mg/dL).  Liver Function Tests: Recent Labs  Lab 05/16/23 1449 05/21/23 1153  AST 18 27  ALT 16 24  ALKPHOS 65 74  BILITOT 2.3* 2.0*  PROT 7.6 7.4  ALBUMIN 3.6 2.7*    Urine analysis:    Component Value Date/Time   COLORURINE YELLOW 05/21/2023 1424   APPEARANCEUR CLEAR 05/21/2023 1424   APPEARANCEUR Cloudy (A) 12/23/2022 1202   LABSPEC 1.033 (H) 05/21/2023 1424   PHURINE 5.0 05/21/2023 1424   GLUCOSEU NEGATIVE 05/21/2023 1424   HGBUR SMALL (A) 05/21/2023 1424   BILIRUBINUR NEGATIVE 05/21/2023 1424   BILIRUBINUR Negative 12/23/2022 1202   KETONESUR NEGATIVE 05/21/2023 1424   PROTEINUR NEGATIVE 05/21/2023 1424   NITRITE NEGATIVE 05/21/2023 1424   LEUKOCYTESUR TRACE (A) 05/21/2023 1424    Radiological Exams on Admission: CT ABDOMEN PELVIS W CONTRAST  Result Date: 05/21/2023 CLINICAL DATA:  Generalized abdominal pain. EXAM: CT ABDOMEN AND PELVIS WITH CONTRAST TECHNIQUE: Multidetector CT imaging of the abdomen and pelvis was performed using the standard protocol following bolus administration of intravenous contrast. RADIATION DOSE REDUCTION: This exam was performed according to the departmental dose-optimization program which includes automated exposure  control, adjustment of the mA and/or kV according to patient size and/or use of iterative reconstruction technique. CONTRAST:  75mL OMNIPAQUE IOHEXOL 350 MG/ML SOLN COMPARISON:  None Available. FINDINGS: Lower chest: No acute abnormality. Hepatobiliary: No focal liver abnormality is seen. No gallstones, gallbladder wall thickening, or biliary dilatation. Pancreas: Unremarkable. No pancreatic ductal dilatation or surrounding inflammatory changes. Spleen: Normal in size without focal abnormality. Adrenals/Urinary Tract: Adrenal glands are unremarkable. Kidneys are normal, without renal calculi, focal lesion, or hydronephrosis. Bladder is unremarkable. Stomach/Bowel: Stomach is within normal limits. Appendix appears normal. There is a multi loculated fluid collection in the lower mid abdomen with multiple additional fluid  collections which may reflect developing abscesses. For reference, the multiloculated collection along the superior aspect of the bladder measures approximately 7.7 x 8.2 cm (series 3, image 74). A rim enhancing fluid collection in the pelvis measures approximately 9.7 x 4.7 cm (series 7, image 64). There is focal bowel wall thickening of the sigmoid colon, which may represent a colonic mass or focal diverticulitis, and is in close proximity to the multiloculated collection (series 3, images 70-74). There are multiple loops of dilated small bowel with a possible transition to decompressed small bowel in the lower right abdomen. Vascular/Lymphatic: Aortic atherosclerosis. No enlarged abdominal or pelvic lymph nodes. Reproductive: Prostate is unremarkable. Other: A mass in the inferior anterior abdominal wall in the subcutaneous fat and abutting the skin surface measures 2.6 x 1.0 cm (series 6, image 93). No abdominal wall hernia. Musculoskeletal: Degenerative changes are seen in the spine. IMPRESSION: 1. Sigmoid colon bowel wall thickening may reflect a colonic mass or focal diverticulitis. Multiple  rim enhancing fluid collections in the abdomen or pelvis likely reflect abscesses and suggest bowel perforation due to the colonic mass/focal diverticulitis. 2. Multiple loops of dilated small bowel with a possible transition to decompressed small bowel in the lower right abdomen. This may reflect ileus or small-bowel obstruction and is likely related to the multiple abdominal abscesses/intraperitoneal inflammatory process. Aortic Atherosclerosis (ICD10-I70.0). Electronically Signed   By: Romona Curls M.D.   On: 05/21/2023 15:00    EKG: Not performed the emergency department  Assessment/Plan Principal Problem:   Intra-abdominal abscess (HCC) Active Problems:   HTN (hypertension)   DM (diabetes mellitus) (HCC)   Intra-abdominal abscess Diverticulitis versus mass SBO versus ileus versus radiographic finding > Patient presenting with abdominal pain with associated nausea vomiting last week but not now, also with constipation with intermittent diarrhea that is nonbloody. > CT abdomen pelvis with colonic wall thickening concerning for mass versus diverticulitis as well as multiple fluid-filled collection concerning for abscesses raising suspicion for possible bowel perforation at some point.  Imaging also noted possible SBO versus ileus. > General surgery consulted in the ED and recommend IR drainage and will follow.  Patient received antibiotics fluids and Zofran in ED. - Monitor on telemetry - Appreciate general surgery recommendations - Continue with ceftriaxone and Flagyl - N.p.o. except sips with meds and ice chips - Trend fever curve and WBC - As needed pain control - Supportive care  Hypertension - Continue home amlodipine - Holding home hydrochlorothiazide for now  Diabetes - SSI   DVT prophylaxis: SCDs Code Status:   Full Family Communication:  Updated at bedside  Disposition Plan:   Patient is from:  Home  Anticipated DC to:  Home  Anticipated DC date:  2 to 4  days  Anticipated DC barriers: None  Consults called:  General surgery, general surgery plans to consult IR Admission status:  Inpatient, telemetry  Severity of Illness: The appropriate patient status for this patient is INPATIENT. Inpatient status is judged to be reasonable and necessary in order to provide the required intensity of service to ensure the patient's safety. The patient's presenting symptoms, physical exam findings, and initial radiographic and laboratory data in the context of their chronic comorbidities is felt to place them at high risk for further clinical deterioration. Furthermore, it is not anticipated that the patient will be medically stable for discharge from the hospital within 2 midnights of admission.   * I certify that at the point of admission it is my clinical judgment that  the patient will require inpatient hospital care spanning beyond 2 midnights from the point of admission due to high intensity of service, high risk for further deterioration and high frequency of surveillance required.Synetta Fail MD [facility]  How to contact the [facility] Attending or Consulting provider 7A - 7P or covering provider during after hours 7P -7A, for this patient?   Check the care team in [facility] and look for a) attending/consulting [facility] provider listed and b) the [facility] team listed Log into [email or web address] and use [place]'s universal password to access. If you do not have the password, please contact the hospital operator. Locate the [facility] provider you are looking for under [facility] and page to a number that you can be directly reached. If you still have difficulty reaching the provider, please page the [facility] (Director on Call) for the Hospitalists listed on amion for assistance.  05/21/2023, 5:23 PM

## 2023-05-22 ENCOUNTER — Encounter (HOSPITAL_COMMUNITY): Payer: Self-pay | Admitting: Internal Medicine

## 2023-05-22 ENCOUNTER — Inpatient Hospital Stay (HOSPITAL_COMMUNITY): Payer: Commercial Managed Care - HMO

## 2023-05-22 DIAGNOSIS — K651 Peritoneal abscess: Secondary | ICD-10-CM | POA: Diagnosis not present

## 2023-05-22 LAB — CBC
HCT: 38.3 % — ABNORMAL LOW (ref 39.0–52.0)
Hemoglobin: 13.7 g/dL (ref 13.0–17.0)
MCH: 28.8 pg (ref 26.0–34.0)
MCHC: 35.8 g/dL (ref 30.0–36.0)
MCV: 80.5 fL (ref 80.0–100.0)
Platelets: 299 10*3/uL (ref 150–400)
RBC: 4.76 MIL/uL (ref 4.22–5.81)
RDW: 13.5 % (ref 11.5–15.5)
WBC: 7.3 10*3/uL (ref 4.0–10.5)
nRBC: 0 % (ref 0.0–0.2)

## 2023-05-22 LAB — COMPREHENSIVE METABOLIC PANEL
ALT: 19 U/L (ref 0–44)
AST: 15 U/L (ref 15–41)
Albumin: 2.3 g/dL — ABNORMAL LOW (ref 3.5–5.0)
Alkaline Phosphatase: 64 U/L (ref 38–126)
Anion gap: 11 (ref 5–15)
BUN: 18 mg/dL (ref 8–23)
CO2: 27 mmol/L (ref 22–32)
Calcium: 7.9 mg/dL — ABNORMAL LOW (ref 8.9–10.3)
Chloride: 98 mmol/L (ref 98–111)
Creatinine, Ser: 1.07 mg/dL (ref 0.61–1.24)
GFR, Estimated: >60 mL/min (ref >60)
Glucose, Bld: 127 mg/dL — ABNORMAL HIGH (ref 70–99)
Potassium: 2.9 mmol/L — ABNORMAL LOW (ref 3.5–5.1)
Sodium: 136 mmol/L (ref 135–145)
Total Bilirubin: 1.3 mg/dL — ABNORMAL HIGH (ref 0.3–1.2)
Total Protein: 6.4 g/dL — ABNORMAL LOW (ref 6.5–8.1)

## 2023-05-22 LAB — GLUCOSE, CAPILLARY
Glucose-Capillary: 125 mg/dL — ABNORMAL HIGH (ref 70–99)
Glucose-Capillary: 126 mg/dL — ABNORMAL HIGH (ref 70–99)
Glucose-Capillary: 145 mg/dL — ABNORMAL HIGH (ref 70–99)
Glucose-Capillary: 156 mg/dL — ABNORMAL HIGH (ref 70–99)
Glucose-Capillary: 160 mg/dL — ABNORMAL HIGH (ref 70–99)
Glucose-Capillary: 247 mg/dL — ABNORMAL HIGH (ref 70–99)

## 2023-05-22 LAB — MAGNESIUM: Magnesium: 2.6 mg/dL — ABNORMAL HIGH (ref 1.7–2.4)

## 2023-05-22 LAB — PROTIME-INR
INR: 1.2 (ref 0.8–1.2)
Prothrombin Time: 15.4 seconds — ABNORMAL HIGH (ref 11.4–15.2)

## 2023-05-22 MED ORDER — NALOXONE HCL 0.4 MG/ML IJ SOLN
INTRAMUSCULAR | Status: AC
  Filled 2023-05-22: qty 1

## 2023-05-22 MED ORDER — LIDOCAINE HCL (PF) 1 % IJ SOLN
10.0000 mL | Freq: Once | INTRAMUSCULAR | Status: AC
  Administered 2023-05-22: 10 mL

## 2023-05-22 MED ORDER — FENTANYL CITRATE (PF) 100 MCG/2ML IJ SOLN
INTRAMUSCULAR | Status: AC
  Filled 2023-05-22: qty 4

## 2023-05-22 MED ORDER — POTASSIUM CHLORIDE 10 MEQ/100ML IV SOLN
10.0000 meq | INTRAVENOUS | Status: AC
  Administered 2023-05-22 (×6): 10 meq via INTRAVENOUS
  Filled 2023-05-22 (×6): qty 100

## 2023-05-22 MED ORDER — FENTANYL CITRATE (PF) 100 MCG/2ML IJ SOLN
INTRAMUSCULAR | Status: AC | PRN
  Administered 2023-05-22: 25 ug via INTRAVENOUS
  Administered 2023-05-22: 50 ug via INTRAVENOUS
  Administered 2023-05-22: 25 ug via INTRAVENOUS

## 2023-05-22 MED ORDER — SODIUM CHLORIDE 0.9% FLUSH
5.0000 mL | Freq: Three times a day (TID) | INTRAVENOUS | Status: DC
  Administered 2023-05-22 – 2023-05-24 (×7): 5 mL

## 2023-05-22 MED ORDER — MIDAZOLAM HCL 2 MG/2ML IJ SOLN
INTRAMUSCULAR | Status: AC | PRN
  Administered 2023-05-22: 1 mg via INTRAVENOUS
  Administered 2023-05-22 (×2): .5 mg via INTRAVENOUS

## 2023-05-22 MED ORDER — MIDAZOLAM HCL 2 MG/2ML IJ SOLN
INTRAMUSCULAR | Status: AC
  Filled 2023-05-22: qty 4

## 2023-05-22 NOTE — Procedures (Signed)
Interventional Radiology Procedure Note  Procedure: Placement of two 90F drains.  Drain #1 infra-umbilical and Drain #2 LEFT transgluteal.  Aspiration of copious foul-smelling purulent fluid.  Cultures sent.   Complications: None  Estimated Blood Loss: None  Recommendations: - Drains to JP x 48 hrs then switch to bags - FLush drains every 8 hrs   Signed,  Sterling Big, MD

## 2023-05-22 NOTE — Progress Notes (Addendum)
[facility] Progress Note     Subjective: CC:  Denies abdominal pain currently. Reports about one week of global abdominal pain and bloating. Reports sharp, central abdominal pain, below the umbilicus, the day he came in. At baseline he reports daily, formed, brown stools each morning. He tells me that he had not had a BM at home for about 6 days. He tried an enema at home without relief of sxs. Here in the hospital he has started having loose, semi-solid brown stools, 2 BMs so far this morning. Denies a personal or family history of colon or intestinal cancer. Denies a known history of diverticulitis.  Objective: Vital signs in last 24 hours: Temp:  [98 F (36.7 C)-99.1 F (37.3 C)] 98.5 F (36.9 C) (07/21 0444) Pulse Rate:  [74-98] 83 (07/21 0444) Resp:  [16-18] 18 (07/21 0444) BP: (126-168)/(66-91) 138/80 (07/21 0444) SpO2:  [97 %-100 %] 97 % (07/21 0444) Weight:  [86.9 kg-91.6 kg] 86.9 kg (07/20 2038) Last BM Date : 05/21/23  Intake/Output from previous day: 07/20 0701 - 07/21 0700 In: 2570.4 [P.O.:120; I.V.:1250; IV Piggyback:1200.4] Out: -  Intake/Output this shift: No intake/output data recorded.  PE: Gen:  Alert, NAD, pleasant Card:  Regular rate and rhythm Pulm:  Normal effort ORA Abd: Soft, mild distention, there is fullness just left of the umbilicus, non-tender, no rebound tenderness or guarding. Skin: warm and dry, no rashes  Psych: A&Ox3   Lab Results:  Recent Labs    05/21/23 1153 05/22/23 0033  WBC 8.7 7.3  HGB 15.6 13.7  HCT 43.8 38.3*  PLT 363 299   BMET Recent Labs    05/21/23 1153 05/22/23 0033  NA 133* 136  K 4.2 2.9*  CL 95* 98  CO2 24 27  GLUCOSE 210* 127*  BUN 23 18  CREATININE 1.09 1.07  CALCIUM 8.4* 7.9*   PT/INR Recent Labs    05/22/23 0033  LABPROT 15.4*  INR 1.2   CMP     Component Value Date/Time   NA 136 05/22/2023 0033   NA 140 12/23/2022 1202   K 2.9 (L) 05/22/2023 0033   CL 98 05/22/2023 0033    CO2 27 05/22/2023 0033   GLUCOSE 127 (H) 05/22/2023 0033   BUN 18 05/22/2023 0033   BUN 11 12/23/2022 1202   CREATININE 1.07 05/22/2023 0033   CALCIUM 7.9 (L) 05/22/2023 0033   PROT 6.4 (L) 05/22/2023 0033   PROT 7.5 12/23/2022 1202   ALBUMIN 2.3 (L) 05/22/2023 0033   ALBUMIN 4.3 12/23/2022 1202   AST 15 05/22/2023 0033   ALT 19 05/22/2023 0033   ALKPHOS 64 05/22/2023 0033   BILITOT 1.3 (H) 05/22/2023 0033   BILITOT 1.0 12/23/2022 1202   GFRNONAA >60 05/22/2023 0033   Lipase     Component Value Date/Time   LIPASE 29 05/21/2023 1153    Studies/Results: CT ABDOMEN PELVIS W CONTRAST  Result Date: 05/21/2023 CLINICAL DATA:  Generalized abdominal pain. EXAM: CT ABDOMEN AND PELVIS WITH CONTRAST TECHNIQUE: Multidetector CT imaging of the abdomen and pelvis was performed using the standard protocol following bolus administration of intravenous contrast. RADIATION DOSE REDUCTION: This exam was performed according to the departmental dose-optimization program which includes automated exposure control, adjustment of the mA and/or kV according to patient size and/or use of iterative reconstruction technique. CONTRAST:  75mL OMNIPAQUE IOHEXOL 350 MG/ML SOLN COMPARISON:  None Available. FINDINGS: Lower chest: No acute abnormality. Hepatobiliary: No focal liver abnormality is seen. No gallstones, gallbladder wall thickening, or  biliary dilatation. Pancreas: Unremarkable. No pancreatic ductal dilatation or surrounding inflammatory changes. Spleen: Normal in size without focal abnormality. Adrenals/Urinary Tract: Adrenal glands are unremarkable. Kidneys are normal, without renal calculi, focal lesion, or hydronephrosis. Bladder is unremarkable. Stomach/Bowel: Stomach is within normal limits. Appendix appears normal. There is a multi loculated fluid collection in the lower mid abdomen with multiple additional fluid collections which may reflect developing abscesses. For reference, the multiloculated  collection along the superior aspect of the bladder measures approximately 7.7 x 8.2 cm (series 3, image 74). A rim enhancing fluid collection in the pelvis measures approximately 9.7 x 4.7 cm (series 7, image 64). There is focal bowel wall thickening of the sigmoid colon, which may represent a colonic mass or focal diverticulitis, and is in close proximity to the multiloculated collection (series 3, images 70-74). There are multiple loops of dilated small bowel with a possible transition to decompressed small bowel in the lower right abdomen. Vascular/Lymphatic: Aortic atherosclerosis. No enlarged abdominal or pelvic lymph nodes. Reproductive: Prostate is unremarkable. Other: A mass in the inferior anterior abdominal wall in the subcutaneous fat and abutting the skin surface measures 2.6 x 1.0 cm (series 6, image 93). No abdominal wall hernia. Musculoskeletal: Degenerative changes are seen in the spine. IMPRESSION: 1. Sigmoid colon bowel wall thickening may reflect a colonic mass or focal diverticulitis. Multiple rim enhancing fluid collections in the abdomen or pelvis likely reflect abscesses and suggest bowel perforation due to the colonic mass/focal diverticulitis. 2. Multiple loops of dilated small bowel with a possible transition to decompressed small bowel in the lower right abdomen. This may reflect ileus or small-bowel obstruction and is likely related to the multiple abdominal abscesses/intraperitoneal inflammatory process. Aortic Atherosclerosis (ICD10-I70.0). Electronically Signed   By: Romona Curls M.D.   On: 05/21/2023 15:00    Anti-infectives: Anti-infectives (From admission, onward)    Start     Dose/Rate Route Frequency Ordered Stop   05/22/23 1000  cefTRIAXone (ROCEPHIN) 2 g in sodium chloride 0.9 % 100 mL IVPB        2 g 200 mL/hr over 30 Minutes Intravenous Every 24 hours 05/21/23 1723     05/22/23 0500  metroNIDAZOLE (FLAGYL) IVPB 500 mg        500 mg 100 mL/hr over 60 Minutes  Intravenous Every 12 hours 05/21/23 1723     05/21/23 1515  cefTRIAXone (ROCEPHIN) 1 g in sodium chloride 0.9 % 100 mL IVPB        1 g 200 mL/hr over 30 Minutes Intravenous  Once 05/21/23 1505 05/21/23 1728   05/21/23 1515  metroNIDAZOLE (FLAGYL) IVPB 500 mg        500 mg 100 mL/hr over 60 Minutes Intravenous  Once 05/21/23 1505 05/21/23 1844        Assessment/Plan  62 year old male with wall thickening of the sigmoid colon and intra-abdominal fluid collections - afebrile, VSS, WBC WNL - IR consulted, planning IR perc drainage of collections - no emergent role for surgery, he will need a colonoscopy to r/o colon malignancy as the etiology of his abscesses.  - suspect his obstipation followed by diarrhea are a result of ileus due to these fluid collections, lower suspicion for obstruction. He is having flatus and loose stools now. Will follow clinically. Ok for a full liquid diet after IR procedure from [facility] standpoint.   HTN DM    LOS: 1 day   I reviewed nursing notes, Consultant IR notes, hospitalist notes, last 24 h vitals and pain  scores, last 48 h intake and output, last 24 h labs and trends, and last 24 h imaging results.  This care required moderate level of medical decision making.   Hosie Spangle, PA-C [facility] Please see Amion for pager number during day hours 7:00am-4:30pm

## 2023-05-22 NOTE — Progress Notes (Signed)
  Progress Note   Patient: Duane [name]:[id number] DOB: 1961-06-28 DOA: 05/21/2023     1 DOS: the patient was seen and examined on 05/22/2023   Brief hospital course: 61yo with h/o HTN and DM who presented on 7/20 with abdominal pain.  CT with colonic wall thickening concerning for mass vs. Diverticulitis with multiple fluid collections suspicious for abscesses.  Also with SBO vs. Ileus.  Surgery consulted, recommended IT drainage.  On Rocephin, Flagyl.    Assessment and Plan:   Intra-abdominal abscess Diverticulitis versus mass SBO versus ileus versus radiographic finding Patient presenting with abdominal pain with recent n/v (last week but not now), also with constipation with intermittent diarrhea that is nonbloody CT abdomen pelvis with colonic wall thickening concerning for mass versus diverticulitis as well as multiple fluid-filled collection concerning for abscesses raising suspicion for possible bowel perforation at some point.  Imaging also noted possible SBO versus ileus. General surgery consulted in the ED and recommend IR drainage and will follow.   He has never had a colonoscopy, scheduled for 8/6 Started on ceftriaxone and metronidazole Surgery consulted and recommended IR percutaneous drainage  IR consulted and placed drains to JP x 48 hours and then switch to bags with drain flushing q8h  HTN Continue amlodipine Hold hydrochlorothiazide  DM -Will check A1c -hold Glucophage -Cover with moderate-scale SSI   Consultants: Surgery IR  Procedures: Placement of infraumbilical and L transgluteal drains  Antibiotics: Ceftriaxone 7/20- Metronidazole 7/20-   Subjective: Patient reported periodic severe lower abdominal pain.  No more n/v.  Some diarrhea.   Objective: Vitals:   05/21/23 2340 05/22/23 0444  BP: 126/66 138/80  Pulse: 76 83  Resp: 18 18  Temp: 98 F (36.7 C) 98.5 F (36.9 C)  SpO2: 100% 97%    Intake/Output Summary (Last 24 hours) at  05/22/2023 0833 Last data filed at 05/22/2023 0601 Gross per 24 hour  Intake 2570.36 ml  Output --  Net 2570.36 ml   Filed Weights   05/21/23 1144 05/21/23 2038  Weight: 91.6 kg 86.9 kg    Exam:  General:  Appears calm and comfortable and is in NAD Eyes:  EOMI, normal lids, iris ENT:  grossly normal hearing, lips & tongue, mmm; suboptimal dentition Neck:  no LAD, masses or thyromegaly Cardiovascular:  RRR, no m/r/g. No LE edema.  Respiratory:   CTA bilaterally with no wheezes/rales/rhonchi.  Normal respiratory effort. Abdomen:  soft, mildly diffusely tender, mildly distended Skin:  no rash or induration seen on limited exam Musculoskeletal:  grossly normal tone BUE/BLE, good ROM, no bony abnormality Psychiatric:  blunted mood and affect, speech fluent and appropriate, AOx3 Neurologic:  CN 2-12 grossly intact, moves all extremities in coordinated fashion  Data Reviewed: I have reviewed the patient's lab results since admission.  Pertinent labs for today include:  K+ 2.9 Glucose 127 Albumin 2.3 Bili 1.3 Unremarkable CBC INR 1.2   Family Communication: None present; he declined to have me call family at the time of admission  Disposition: Status is: Inpatient Remains inpatient appropriate because: ongoing procedural management and IV antibiotics  Planned Discharge Destination: Home    Time spent: 50 minutes  Author: Jonah Blue, MD 05/22/2023 8:32 AM  For on call review [email or web address].

## 2023-05-22 NOTE — Consult Note (Signed)
Chief Complaint: Patient was seen in consultation today for abdominal abscess  Referring Physician(s): Dr. Derrell Lolling  Supervising Physician: Malachy Moan  [facility] Status: [name]  History of Present Illness: Duane [name] is a 62 y.o. male with history of DM, HTN who developed constipation with abdominal pain, bloating at home 1 week prior to presentation.    CT Abdomen Pelvis showed: 1. Sigmoid colon bowel wall thickening may reflect a colonic mass or focal diverticulitis. Multiple rim enhancing fluid collections in the abdomen or pelvis likely reflect abscesses and suggest bowel perforation due to the colonic mass/focal diverticulitis. 2. Multiple loops of dilated small bowel with a possible transition to decompressed small bowel in the lower right abdomen. This may reflect ileus or small-bowel obstruction and is likely related to the multiple abdominal abscesses/intraperitoneal inflammatory process.  IR consulted for aspiration and drainage of fluid collections.   Case reviewed by Dr. Archer Asa who approves patient for procedure.  Discussed with patient and wife at bedside who are agreeable to proceed.  He has been NPO.   Past Medical History:  Diagnosis Date   Diabetes mellitus without complication (HCC)    Hypertension     History reviewed. No pertinent surgical history.  Allergies: Codeine  Medications: Prior to Admission medications   Medication Sig Start Date End Date Taking? Authorizing Provider  amLODipine (NORVASC) 10 MG tablet Take 1 tablet (10 mg total) by mouth daily. 09/07/22  Yes [name], NP  hydrochlorothiazide (HYDRODIURIL) 25 MG tablet Take 1 tablet (25 mg total) by mouth daily. 09/07/22  Yes [name], NP  metFORMIN (GLUCOPHAGE) 500 MG tablet Take 1 tablet (500 mg total) by mouth 2 (two) times daily with a meal. 09/07/22  Yes [name], NP  pravastatin (PRAVACHOL) 20 MG tablet Take 1 tablet by mouth once daily  04/05/23  Yes Edwards, Michelle P, NP  Na Sulfate-K Sulfate-Mg Sulf 17.5-3.13-1.6 GM/177ML SOLN Take 354 mLs by mouth as directed. Patient not taking: Reported on 05/21/2023 05/18/23   [provider]     Family History  Problem Relation Age of Onset   Colon cancer Neg Hx    Colon polyps Neg Hx    Rectal cancer Neg Hx    Stomach cancer Neg Hx    Esophageal cancer Neg Hx     Social History   Socioeconomic History   Marital status: Single    Spouse name: Not on file   Number of children: Not on file   Years of education: Not on file   Highest education level: Not on file  Occupational History   Not on file  Tobacco Use   Smoking status: Every Day    Types: Cigarettes   Smokeless tobacco: Never  Vaping Use   Vaping status: Never Used  Substance and Sexual Activity   Alcohol use: Not Currently    Comment: 3x/week   Drug use: Never   Sexual activity: Not on file  Other Topics Concern   Not on file  Social History Narrative   Not on file   Social Determinants of Health   Financial Resource Strain: Not on file  Food Insecurity: Not on file  Transportation Needs: Not on file  Physical Activity: Not on file  Stress: Not on file  Social Connections: Not on file     Review of Systems: A 12 point ROS discussed and pertinent positives are indicated in the HPI above.  All other systems are negative.  Review of Systems  Constitutional:  Negative for fatigue and fever.  Respiratory:  Negative for cough and shortness of breath.   Cardiovascular:  Negative for chest pain.  Gastrointestinal:  Positive for abdominal distention, abdominal pain, constipation and diarrhea.  Musculoskeletal:  Negative for back pain.  Psychiatric/Behavioral:  Negative for behavioral problems and confusion.     Vital Signs: BP 128/70 (BP Location: Right Arm)   Pulse 66   Temp 98.2 F (36.8 C) (Oral)   Resp 17   Ht 6\' 2"  (1.88 m)   Wt 191 lb 9.3 oz (86.9 kg)   SpO2 98%   BMI 24.60  kg/m   Physical Exam Vitals and nursing note reviewed.  Constitutional:      General: He is not in acute distress.    Appearance: He is well-developed. He is not ill-appearing.  Cardiovascular:     Rate and Rhythm: Normal rate and regular rhythm.  Pulmonary:     Effort: Pulmonary effort is normal.  Abdominal:     General: There is distension.     Tenderness: There is generalized abdominal tenderness.  Skin:    General: Skin is warm and dry.  Neurological:     General: No focal deficit present.     Mental Status: He is alert and oriented to person, place, and time.  Psychiatric:        Mood and Affect: Mood normal.        Behavior: Behavior normal.      MD Evaluation Airway: WNL Heart: WNL Abdomen: WNL Chest/ Lungs: WNL ASA  Classification: 3 Mallampati/Airway Score: Two   Imaging: CT ABDOMEN PELVIS W CONTRAST  Result Date: 05/21/2023 CLINICAL DATA:  Generalized abdominal pain. EXAM: CT ABDOMEN AND PELVIS WITH CONTRAST TECHNIQUE: Multidetector CT imaging of the abdomen and pelvis was performed using the standard protocol following bolus administration of intravenous contrast. RADIATION DOSE REDUCTION: This exam was performed according to the departmental dose-optimization program which includes automated exposure control, adjustment of the mA and/or kV according to patient size and/or use of iterative reconstruction technique. CONTRAST:  75mL OMNIPAQUE IOHEXOL 350 MG/ML SOLN COMPARISON:  None Available. FINDINGS: Lower chest: No acute abnormality. Hepatobiliary: No focal liver abnormality is seen. No gallstones, gallbladder wall thickening, or biliary dilatation. Pancreas: Unremarkable. No pancreatic ductal dilatation or surrounding inflammatory changes. Spleen: Normal in size without focal abnormality. Adrenals/Urinary Tract: Adrenal glands are unremarkable. Kidneys are normal, without renal calculi, focal lesion, or hydronephrosis. Bladder is unremarkable. Stomach/Bowel:  Stomach is within normal limits. Appendix appears normal. There is a multi loculated fluid collection in the lower mid abdomen with multiple additional fluid collections which may reflect developing abscesses. For reference, the multiloculated collection along the superior aspect of the bladder measures approximately 7.7 x 8.2 cm (series 3, image 74). A rim enhancing fluid collection in the pelvis measures approximately 9.7 x 4.7 cm (series 7, image 64). There is focal bowel wall thickening of the sigmoid colon, which may represent a colonic mass or focal diverticulitis, and is in close proximity to the multiloculated collection (series 3, images 70-74). There are multiple loops of dilated small bowel with a possible transition to decompressed small bowel in the lower right abdomen. Vascular/Lymphatic: Aortic atherosclerosis. No enlarged abdominal or pelvic lymph nodes. Reproductive: Prostate is unremarkable. Other: A mass in the inferior anterior abdominal wall in the subcutaneous fat and abutting the skin surface measures 2.6 x 1.0 cm (series 6, image 93). No abdominal wall hernia. Musculoskeletal: Degenerative changes are seen in the spine. IMPRESSION: 1.  Sigmoid colon bowel wall thickening may reflect a colonic mass or focal diverticulitis. Multiple rim enhancing fluid collections in the abdomen or pelvis likely reflect abscesses and suggest bowel perforation due to the colonic mass/focal diverticulitis. 2. Multiple loops of dilated small bowel with a possible transition to decompressed small bowel in the lower right abdomen. This may reflect ileus or small-bowel obstruction and is likely related to the multiple abdominal abscesses/intraperitoneal inflammatory process. Aortic Atherosclerosis (ICD10-I70.0). Electronically Signed   By: Romona Curls M.D.   On: 05/21/2023 15:00    Labs:  CBC: Recent Labs    12/23/22 1202 05/16/23 1449 05/21/23 1153 05/22/23 0033  WBC 7.8 7.6 8.7 7.3  HGB 13.7 15.4 15.6  13.7  HCT 42.1 44.3 43.8 38.3*  PLT 239 250 363 299    COAGS: Recent Labs    05/22/23 0033  INR 1.2    BMP: Recent Labs    12/23/22 1202 05/16/23 1449 05/21/23 1153 05/22/23 0033  NA 140 137 133* 136  K 3.9 3.0* 4.2 2.9*  CL 100 98 95* 98  CO2 23 23 24 27   GLUCOSE 105* 249* 210* 127*  BUN 11 11 23 18   CALCIUM 9.6 9.3 8.4* 7.9*  CREATININE 0.99 1.31* 1.09 1.07  GFRNONAA  --  >60 >60 >60    LIVER FUNCTION TESTS: Recent Labs    12/23/22 1202 05/16/23 1449 05/21/23 1153 05/22/23 0033  BILITOT 1.0 2.3* 2.0* 1.3*  AST 15 18 27 15   ALT 10 16 24 19   ALKPHOS 77 65 74 64  PROT 7.5 7.6 7.4 6.4*  ALBUMIN 4.3 3.6 2.7* 2.3*    TUMOR MARKERS: No results for input(s): "AFPTM", "CEA", "CA199", "CHROMGRNA" in the last 8760 hours.  Assessment and Plan: Intra-abdominal fluid collection in the setting of diverticulitis vs. Mass suspected Patient with acute onset abdominal pain, constipation, diarrhea, bloating.  Found to have large intra-abdominal fluid collections suspicious for abscesses.  IR consulted for aspiration and drainage.  Case reviewed and approved by Dr. Archer Asa.  Patient has been NPO.  INR 1.2, not on blood thinners.   Discussed with patient and his wife.  They are aware of goals of procedure.  They are aware of potential for drains to remain in place at discharge pending clinical course as well as the potential need for additional drains.  They are agreeable to proceed.   Risks and benefits discussed with the patient including bleeding, infection, damage to adjacent structures, bowel perforation/fistula connection, and sepsis.  All of the patient's questions were answered, patient is agreeable to proceed. Consent signed and in chart.   Thank you for this interesting consult.  I greatly enjoyed meeting EVERARD INTERRANTE and look forward to participating in their care.  A copy of this report was sent to the requesting provider on this date.  Electronically  Signed: Hoyt Koch, PA 05/22/2023, 9:24 AM   I spent a total of 40 Minutes    in face to face in clinical consultation, greater than 50% of which was counseling/coordinating care for intra-abdominal abscess.

## 2023-05-22 NOTE — Hospital Course (Signed)
61yo with h/o HTN and DM who presented on 7/20 with abdominal pain.  CT with colonic wall thickening concerning for mass vs. Diverticulitis with multiple fluid collections suspicious for abscesses.  Also with SBO vs. Ileus.  Surgery consulted, recommended IT drainage.  On Rocephin, Flagyl.

## 2023-05-23 DIAGNOSIS — E785 Hyperlipidemia, unspecified: Secondary | ICD-10-CM | POA: Diagnosis present

## 2023-05-23 DIAGNOSIS — K651 Peritoneal abscess: Secondary | ICD-10-CM | POA: Diagnosis not present

## 2023-05-23 LAB — GLUCOSE, CAPILLARY
Glucose-Capillary: 135 mg/dL — ABNORMAL HIGH (ref 70–99)
Glucose-Capillary: 162 mg/dL — ABNORMAL HIGH (ref 70–99)
Glucose-Capillary: 134 mg/dL — ABNORMAL HIGH (ref 70–99)
Glucose-Capillary: 189 mg/dL — ABNORMAL HIGH (ref 70–99)
Glucose-Capillary: 151 mg/dL — ABNORMAL HIGH (ref 70–99)

## 2023-05-23 LAB — AEROBIC/ANAEROBIC CULTURE W GRAM STAIN (SURGICAL/DEEP WOUND): Special Requests: NORMAL

## 2023-05-23 MED ORDER — INSULIN ASPART 100 UNIT/ML IJ SOLN
0.0000 [IU] | Freq: Three times a day (TID) | INTRAMUSCULAR | Status: DC
  Administered 2023-05-23 (×3): 2 [IU] via SUBCUTANEOUS
  Administered 2023-05-23: 1 [IU] via SUBCUTANEOUS
  Administered 2023-05-24: 2 [IU] via SUBCUTANEOUS
  Administered 2023-05-24: 3 [IU] via SUBCUTANEOUS

## 2023-05-23 NOTE — Progress Notes (Signed)
Progress Note   Patient: Duane [name]:[id number] DOB: 10-30-1961 DOA: 05/21/2023     2 DOS: the patient was seen and examined on 05/23/2023   Brief hospital course: 61yo with h/o HTN and DM who presented on 7/20 with abdominal pain.  CT with colonic wall thickening concerning for mass vs. Diverticulitis with multiple fluid collections suspicious for abscesses.  Also with SBO vs. Ileus.  Surgery consulted, recommended IR drainage.  On Rocephin, Flagyl.  Had infraumbilical and L transgluteal drains placed on 7/21.  Assessment and Plan:   Intra-abdominal abscess Diverticulitis versus mass Patient presenting with abdominal pain with recent n/v (last week but not now), also with constipation with intermittent diarrhea that is nonbloody CT abdomen pelvis with colonic wall thickening concerning for mass versus diverticulitis as well as multiple fluid-filled collection concerning for abscesses raising suspicion for possible bowel perforation at some point.  Imaging also noted possible SBO versus ileus. General surgery consulted in the ED and recommend IR drainage and will follow.   He has never had a colonoscopy, scheduled for 8/6 Started on ceftriaxone and metronidazole - will transition to PO antibiotics once cultures are back Gram stain with abundant WBC, abundant GPC and GNR; culture with abundant GNR, C&S pending Surgery consulted and recommended IR percutaneous drainage  IR consulted and placed drains to JP x 48 hours and then switch to bags with drain flushing q8h He reports resolution of pain, no fever, generally feeling better Advancing diet   HTN Continue amlodipine Hold hydrochlorothiazide   DM Prior A1c was 6.4 in 2/204, good control Hold Glucophage Cover with moderate-scale SSI   HLD Continue pravastatin    Consultants: Surgery IR   Procedures: Placement of infraumbilical and L transgluteal drains   Antibiotics: Ceftriaxone 7/20- Metronidazole 7/20-    30  Day Unplanned Readmission Risk Score    Flowsheet Row ED to Hosp-Admission (Current) from 05/21/2023 in [facility]  SURGICAL  30 Day Unplanned Readmission Risk Score (%) 7.95 Filed at 05/23/2023 0401       This score is the patient's risk of an unplanned readmission within 30 days of being discharged (0 -100%). The score is based on dignosis, age, lab data, medications, orders, and past utilization.   Low:  0-14.9   Medium: 15-21.9   High: 22-29.9   Extreme: 30 and above           Subjective: Feeling much better today.  No specific concerns.  Drains are in place.   Objective: Vitals:   05/23/23 0338 05/23/23 0826  BP: (!) 146/76 (!) 140/76  Pulse: 74 69  Resp: 18 18  Temp: 98.5 F (36.9 C) 98.2 F (36.8 C)  SpO2: 99% 99%    Intake/Output Summary (Last 24 hours) at 05/23/2023 1731 Last data filed at 05/23/2023 1500 Gross per 24 hour  Intake 330 ml  Output 120 ml  Net 210 ml   Filed Weights   05/21/23 1144 05/21/23 2038 05/23/23 0336  Weight: 91.6 kg 86.9 kg 88.9 kg    Exam:  General:  Appears calm and comfortable and is in NAD Eyes:  EOMI, normal lids, iris ENT:  grossly normal hearing, lips & tongue, mmm; suboptimal dentition Neck:  no LAD, masses or thyromegaly Cardiovascular:  RRR, no m/r/g. No LE edema.  Respiratory:   CTA bilaterally with no wheezes/rales/rhonchi.  Normal respiratory effort. Abdomen:  soft, NT, ND; periumbilical and perineal drains in place Skin:  no rash or induration seen on limited  exam Musculoskeletal:  grossly normal tone BUE/BLE, good ROM, no bony abnormality Psychiatric:  blunted mood and affect, speech fluent and appropriate, AOx3 Neurologic:  CN 2-12 grossly intact, moves all extremities in coordinated fashion  Data Reviewed: I have reviewed the patient's lab results since admission.  Pertinent labs for today include:  None     Family Communication: Sister was present throughout  evaluation  Disposition: Status is: Inpatient Remains inpatient appropriate because: ongoing IV antibiotics  Planned Discharge Destination: Home    Time spent: 35 minutes  Author: Jonah Blue, MD 05/23/2023 5:31 PM  For on call review [email or web address].

## 2023-05-23 NOTE — Plan of Care (Signed)
  Problem: Coping: Goal: Ability to adjust to condition or change in health will improve Outcome: Progressing   Problem: Fluid Volume: Goal: Ability to maintain a balanced intake and output will improve Outcome: Progressing   

## 2023-05-23 NOTE — Progress Notes (Signed)
[facility] Progress Note     Subjective: CC:  Denies pain. BMx2 yesterday. Tolerating FLD. Denies nausea or vomiting  Objective: Vital signs in last 24 hours: Temp:  [98.2 F (36.8 C)-99.2 F (37.3 C)] 98.2 F (36.8 C) (07/22 0826) Pulse Rate:  [61-79] 69 (07/22 0826) Resp:  [13-19] 18 (07/22 0826) BP: (121-152)/(69-83) 140/76 (07/22 0826) SpO2:  [96 %-100 %] 99 % (07/22 0826) Weight:  [88.9 kg] 88.9 kg (07/22 0336) Last BM Date : 05/22/23  Intake/Output from previous day: 07/21 0701 - 07/22 0700 In: 1630.3 [I.V.:873.2; IV Piggyback:737.1] Out: 210 [Drains:210] Intake/Output this shift: No intake/output data recorded.  PE: Gen:  Alert, NAD, pleasant Card:  Regular rate and rhythm Pulm:  Normal effort ORA Abd: Soft, mild distention, appropraitely tender around drain  Abd JP - 110 cc purulent  Transgluteal JP - 100 cc purulent  Skin: warm and dry, no rashes  Psych: A&Ox3   Lab Results:  Recent Labs    05/21/23 1153 05/22/23 0033  WBC 8.7 7.3  HGB 15.6 13.7  HCT 43.8 38.3*  PLT 363 299   BMET Recent Labs    05/21/23 1153 05/22/23 0033  NA 133* 136  K 4.2 2.9*  CL 95* 98  CO2 24 27  GLUCOSE 210* 127*  BUN 23 18  CREATININE 1.09 1.07  CALCIUM 8.4* 7.9*   PT/INR Recent Labs    05/22/23 0033  LABPROT 15.4*  INR 1.2   CMP     Component Value Date/Time   NA 136 05/22/2023 0033   NA 140 12/23/2022 1202   K 2.9 (L) 05/22/2023 0033   CL 98 05/22/2023 0033   CO2 27 05/22/2023 0033   GLUCOSE 127 (H) 05/22/2023 0033   BUN 18 05/22/2023 0033   BUN 11 12/23/2022 1202   CREATININE 1.07 05/22/2023 0033   CALCIUM 7.9 (L) 05/22/2023 0033   PROT 6.4 (L) 05/22/2023 0033   PROT 7.5 12/23/2022 1202   ALBUMIN 2.3 (L) 05/22/2023 0033   ALBUMIN 4.3 12/23/2022 1202   AST 15 05/22/2023 0033   ALT 19 05/22/2023 0033   ALKPHOS 64 05/22/2023 0033   BILITOT 1.3 (H) 05/22/2023 0033   BILITOT 1.0 12/23/2022 1202   GFRNONAA >60 05/22/2023 0033    Lipase     Component Value Date/Time   LIPASE 29 05/21/2023 1153    Studies/Results: CT GUIDED VISCERAL FLUID DRAIN BY PERC CATH  Result Date: 05/22/2023 INDICATION: 62 year old male with multiple intra-abdominal abscesses and small-bowel ileus. 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 He presents for CT-guided abscess drain placement. EXAM: CT-guided drain placement CT-guided drain placement TECHNIQUE: Multidetector CT imaging of the abdomen and pelvis was performed following the standard protocol without IV contrast. RADIATION DOSE REDUCTION: This exam was performed according to the departmental dose-optimization program which includes automated exposure control, adjustment of the mA and/or kV according to patient size and/or use of iterative reconstruction technique. MEDICATIONS: The patient is currently admitted to the hospital and receiving intravenous antibiotics. The antibiotics were administered within an appropriate time frame prior to the initiation of the procedure. ANESTHESIA/SEDATION: Moderate (conscious) sedation was employed during this procedure. A total of Versed 2 mg and Fentanyl 100 mcg was administered intravenously by the radiology nurse. Total intra-service moderate Sedation Time: 28 minutes. The patient's level of consciousness and vital signs were monitored continuously by radiology nursing throughout the procedure under my direct supervision. COMPLICATIONS: None immediate. PROCEDURE: Informed written consent was obtained from the patient  after a thorough discussion of the procedural risks, benefits and alternatives. All questions were addressed. Maximal Sterile Barrier Technique was utilized including caps, mask, sterile gowns, sterile gloves, sterile drape, hand hygiene and skin antiseptic. A timeout was performed prior to the initiation of the procedure. Initial CT imaging was performed. The complex fluid collection in the low anterior abdomen was identified.  The skin was sterilely prepped and draped in the standard fashion using chlorhexidine skin prep. Local anesthesia was attained by infiltration with 1% lidocaine. A small dermatotomy was made. Under intermittent CT guidance, an 18 gauge trocar needle was advanced into the fluid collection. A 0.035 wire was then coiled in the fluid collection. The needle was removed. The percutaneous tract was dilated to 12 [place]. A 12 French all-purpose drainage catheter was then advanced over the wire and formed. Aspiration yields 100 mL foul-smelling purulent fluid. A sample was sent for Gram stain and culture. The catheter was flushed with saline, connected to JP bulb suction and secured to the skin with 0 Prolene suture. Follow-up CT imaging demonstrates a well-positioned drainage catheter with significant resolution of the low anterior abdominal fluid collection. However, the separate pelvic cul-de-sac collection remains unchanged and undrained. Therefore, the patient was repositioned into the left lateral decubitus position. Repeat CT imaging was performed. The complex fluid collection in the pelvic cul-de-sac was identified. The skin was sterilely prepped and draped in standard fashion using chlorhexidine skin prep. Local anesthesia was attained by infiltration with 1% lidocaine. A small dermatotomy was made. Under intermittent CT guidance, an 18 gauge trocar needle was advanced into the fluid collection. A 0.035 wire was then coiled in the fluid collection. The needle was removed. The percutaneous tract was dilated to 12 [place]. A 12 French all-purpose drainage catheter was then advanced over the wire and formed. Aspiration yields 100 mL foul-smelling purulent fluid. A sample was sent for Gram stain and culture. The catheter was flushed with saline, connected to JP bulb suction and secured to the skin with 0 Prolene suture. Final CT imaging confirms resolution of fluid collections with well-positioned drainage catheter x2.  IMPRESSION: 1. Placement of a 12 French drainage catheter into the low anterior abdominal fluid collection with aspiration of 100 mL foul-smelling purulent fluid which was sent for Gram stain and culture. 2. Placement of a 12 French drainage catheter via a left transgluteal approach into the pelvic cul-de-sac collection with aspiration of an additional 100 mL foul-smelling purulent fluid. Electronically Signed   By: Malachy Moan M.D.   On: 05/22/2023 15:40   CT GUIDED PERITONEAL/RETROPERITONEAL FLUID DRAIN BY PERC CATH  Result Date: 05/22/2023 INDICATION: 62 year old male with multiple intra-abdominal abscesses and small-bowel ileus. 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 He presents for CT-guided abscess drain placement. EXAM: CT-guided drain placement CT-guided drain placement TECHNIQUE: Multidetector CT imaging of the abdomen and pelvis was performed following the standard protocol without IV contrast. RADIATION DOSE REDUCTION: This exam was performed according to the departmental dose-optimization program which includes automated exposure control, adjustment of the mA and/or kV according to patient size and/or use of iterative reconstruction technique. MEDICATIONS: The patient is currently admitted to the hospital and receiving intravenous antibiotics. The antibiotics were administered within an appropriate time frame prior to the initiation of the procedure. ANESTHESIA/SEDATION: Moderate (conscious) sedation was employed during this procedure. A total of Versed 2 mg and Fentanyl 100 mcg was administered intravenously by the radiology nurse. Total intra-service moderate Sedation Time: 28 minutes. The patient's  level of consciousness and vital signs were monitored continuously by radiology nursing throughout the procedure under my direct supervision. COMPLICATIONS: None immediate. PROCEDURE: Informed written consent was obtained from the patient after a thorough discussion of the  procedural risks, benefits and alternatives. All questions were addressed. Maximal Sterile Barrier Technique was utilized including caps, mask, sterile gowns, sterile gloves, sterile drape, hand hygiene and skin antiseptic. A timeout was performed prior to the initiation of the procedure. Initial CT imaging was performed. The complex fluid collection in the low anterior abdomen was identified. The skin was sterilely prepped and draped in the standard fashion using chlorhexidine skin prep. Local anesthesia was attained by infiltration with 1% lidocaine. A small dermatotomy was made. Under intermittent CT guidance, an 18 gauge trocar needle was advanced into the fluid collection. A 0.035 wire was then coiled in the fluid collection. The needle was removed. The percutaneous tract was dilated to 12 [place]. A 12 French all-purpose drainage catheter was then advanced over the wire and formed. Aspiration yields 100 mL foul-smelling purulent fluid. A sample was sent for Gram stain and culture. The catheter was flushed with saline, connected to JP bulb suction and secured to the skin with 0 Prolene suture. Follow-up CT imaging demonstrates a well-positioned drainage catheter with significant resolution of the low anterior abdominal fluid collection. However, the separate pelvic cul-de-sac collection remains unchanged and undrained. Therefore, the patient was repositioned into the left lateral decubitus position. Repeat CT imaging was performed. The complex fluid collection in the pelvic cul-de-sac was identified. The skin was sterilely prepped and draped in standard fashion using chlorhexidine skin prep. Local anesthesia was attained by infiltration with 1% lidocaine. A small dermatotomy was made. Under intermittent CT guidance, an 18 gauge trocar needle was advanced into the fluid collection. A 0.035 wire was then coiled in the fluid collection. The needle was removed. The percutaneous tract was dilated to 12 [place]. A 12  French all-purpose drainage catheter was then advanced over the wire and formed. Aspiration yields 100 mL foul-smelling purulent fluid. A sample was sent for Gram stain and culture. The catheter was flushed with saline, connected to JP bulb suction and secured to the skin with 0 Prolene suture. Final CT imaging confirms resolution of fluid collections with well-positioned drainage catheter x2. IMPRESSION: 1. Placement of a 12 French drainage catheter into the low anterior abdominal fluid collection with aspiration of 100 mL foul-smelling purulent fluid which was sent for Gram stain and culture. 2. Placement of a 12 French drainage catheter via a left transgluteal approach into the pelvic cul-de-sac collection with aspiration of an additional 100 mL foul-smelling purulent fluid. Electronically Signed   By: Malachy Moan M.D.   On: 05/22/2023 15:40   CT ABDOMEN PELVIS W CONTRAST  Result Date: 05/21/2023 CLINICAL DATA:  Generalized abdominal pain. EXAM: CT ABDOMEN AND PELVIS WITH CONTRAST TECHNIQUE: Multidetector CT imaging of the abdomen and pelvis was performed using the standard protocol following bolus administration of intravenous contrast. RADIATION DOSE REDUCTION: This exam was performed according to the departmental dose-optimization program which includes automated exposure control, adjustment of the mA and/or kV according to patient size and/or use of iterative reconstruction technique. CONTRAST:  75mL OMNIPAQUE IOHEXOL 350 MG/ML SOLN COMPARISON:  None Available. FINDINGS: Lower chest: No acute abnormality. Hepatobiliary: No focal liver abnormality is seen. No gallstones, gallbladder wall thickening, or biliary dilatation. Pancreas: Unremarkable. No pancreatic ductal dilatation or surrounding inflammatory changes. Spleen: Normal in size without focal abnormality. Adrenals/Urinary Tract: Adrenal glands  are unremarkable. Kidneys are normal, without renal calculi, focal lesion, or hydronephrosis. Bladder  is unremarkable. Stomach/Bowel: Stomach is within normal limits. Appendix appears normal. There is a multi loculated fluid collection in the lower mid abdomen with multiple additional fluid collections which may reflect developing abscesses. For reference, the multiloculated collection along the superior aspect of the bladder measures approximately 7.7 x 8.2 cm (series 3, image 74). A rim enhancing fluid collection in the pelvis measures approximately 9.7 x 4.7 cm (series 7, image 64). There is focal bowel wall thickening of the sigmoid colon, which may represent a colonic mass or focal diverticulitis, and is in close proximity to the multiloculated collection (series 3, images 70-74). There are multiple loops of dilated small bowel with a possible transition to decompressed small bowel in the lower right abdomen. Vascular/Lymphatic: Aortic atherosclerosis. No enlarged abdominal or pelvic lymph nodes. Reproductive: Prostate is unremarkable. Other: A mass in the inferior anterior abdominal wall in the subcutaneous fat and abutting the skin surface measures 2.6 x 1.0 cm (series 6, image 93). No abdominal wall hernia. Musculoskeletal: Degenerative changes are seen in the spine. IMPRESSION: 1. Sigmoid colon bowel wall thickening may reflect a colonic mass or focal diverticulitis. Multiple rim enhancing fluid collections in the abdomen or pelvis likely reflect abscesses and suggest bowel perforation due to the colonic mass/focal diverticulitis. 2. Multiple loops of dilated small bowel with a possible transition to decompressed small bowel in the lower right abdomen. This may reflect ileus or small-bowel obstruction and is likely related to the multiple abdominal abscesses/intraperitoneal inflammatory process. Aortic Atherosclerosis (ICD10-I70.0). Electronically Signed   By: Romona Curls M.D.   On: 05/21/2023 15:00    Anti-infectives: Anti-infectives (From admission, onward)    Start     Dose/Rate Route Frequency  Ordered Stop   05/22/23 1000  cefTRIAXone (ROCEPHIN) 2 g in sodium chloride 0.9 % 100 mL IVPB        2 g 200 mL/hr over 30 Minutes Intravenous Every 24 hours 05/21/23 1723     05/22/23 0500  metroNIDAZOLE (FLAGYL) IVPB 500 mg        500 mg 100 mL/hr over 60 Minutes Intravenous Every 12 hours 05/21/23 1723     05/21/23 1515  cefTRIAXone (ROCEPHIN) 1 g in sodium chloride 0.9 % 100 mL IVPB        1 g 200 mL/hr over 30 Minutes Intravenous  Once 05/21/23 1505 05/21/23 1728   05/21/23 1515  metroNIDAZOLE (FLAGYL) IVPB 500 mg        500 mg 100 mL/hr over 60 Minutes Intravenous  Once 05/21/23 1505 05/21/23 1844        Assessment/Plan  62 year old male with wall thickening of the sigmoid colon and intra-abdominal fluid collections - afebrile, VSS, WBC WNL - s/p placement 2, 67F IR drains 7/21, cultures pending - IV Rocephin/Flagyl   - advance to soft diet. No signs of bowel obstruction. - no emergent role for surgery. Continue IV abx, follow drain cultures. Ok to transition to PO abx once sensitivities are back. From a [facility] standpoint I would consider him stable for discharge as early as tomorrow. He will need outpatient follow up with GI for colonoscopy. Will arrange F/U with Dr. Derrell Lolling in 3-4 weeks.   HTN DM    LOS: 2 days   I reviewed nursing notes, Consultant IR notes, hospitalist notes, last 24 h vitals and pain scores, last 48 h intake and output, last 24 h labs and trends, and last 24 h  imaging results.  This care required moderate level of medical decision making.   Hosie Spangle, PA-C [facility] Please see Amion for pager number during day hours 7:00am-4:30pm

## 2023-05-23 NOTE — Plan of Care (Addendum)
Patient is AOX4. Able to call independently. Ambulates in room independently. Reports voiding freely and BM this shift. Family at bedside throughout shift. No pain reported. Drain x2 dressing is CDI. Flushed drains as ordered. I/O recorded. VSS. RA. IVF KVO. Continues with IV ABX. Tolerating well.   1651 - telemetry box removed. Order expired.   Problem: Education: Goal: Ability to describe self-care measures that may prevent or decrease complications (Diabetes Survival Skills Education) will improve Outcome: Progressing Goal: Individualized Educational Video(s) Outcome: Progressing   Problem: Coping: Goal: Ability to adjust to condition or change in health will improve Outcome: Progressing   Problem: Fluid Volume: Goal: Ability to maintain a balanced intake and output will improve Outcome: Progressing   Problem: Health Behavior/Discharge Planning: Goal: Ability to identify and utilize available resources and services will improve Outcome: Progressing Goal: Ability to manage health-related needs will improve Outcome: Progressing   Problem: Metabolic: Goal: Ability to maintain appropriate glucose levels will improve Outcome: Progressing   Problem: Nutritional: Goal: Maintenance of adequate nutrition will improve Outcome: Progressing Goal: Progress toward achieving an optimal weight will improve Outcome: Progressing   Problem: Skin Integrity: Goal: Risk for impaired skin integrity will decrease Outcome: Progressing   Problem: Tissue Perfusion: Goal: Adequacy of tissue perfusion will improve Outcome: Progressing   Problem: Education: Goal: Knowledge of General Education information will improve Description: Including pain rating scale, medication(s)/side effects and non-pharmacologic comfort measures Outcome: Progressing   Problem: Health Behavior/Discharge Planning: Goal: Ability to manage health-related needs will improve Outcome: Progressing   Problem: Clinical  Measurements: Goal: Ability to maintain clinical measurements within normal limits will improve Outcome: Progressing Goal: Will remain free from infection Outcome: Progressing Goal: Diagnostic test results will improve Outcome: Progressing Goal: Respiratory complications will improve Outcome: Progressing Goal: Cardiovascular complication will be avoided Outcome: Progressing   Problem: Activity: Goal: Risk for activity intolerance will decrease Outcome: Progressing   Problem: Nutrition: Goal: Adequate nutrition will be maintained Outcome: Progressing   Problem: Coping: Goal: Level of anxiety will decrease Outcome: Progressing   Problem: Elimination: Goal: Will not experience complications related to bowel motility Outcome: Progressing Goal: Will not experience complications related to urinary retention Outcome: Progressing   Problem: Pain Managment: Goal: General experience of comfort will improve Outcome: Progressing   Problem: Safety: Goal: Ability to remain free from injury will improve Outcome: Progressing   Problem: Skin Integrity: Goal: Risk for impaired skin integrity will decrease Outcome: Progressing

## 2023-05-23 NOTE — Progress Notes (Signed)
Referring Physician(s): Dr. Derrell Lolling  Supervising Physician: Gilmer Mor  Patient Status:  [facility] - In-pt  Chief Complaint: Intra-abdominal abscesses  Subjective: Patient s/p drain placement x2.  Resting comfortably in bed. Friend at bedside.  Eating small amount of food/snacks.  Reports he feels better since drain placement.   Allergies: Codeine  Medications: Prior to Admission medications   Medication Sig Start Date End Date Taking? Authorizing Provider  amLODipine (NORVASC) 10 MG tablet Take 1 tablet (10 mg total) by mouth daily. 09/07/22  Yes [name], NP  hydrochlorothiazide (HYDRODIURIL) 25 MG tablet Take 1 tablet (25 mg total) by mouth daily. 09/07/22  Yes [name], NP  metFORMIN (GLUCOPHAGE) 500 MG tablet Take 1 tablet (500 mg total) by mouth 2 (two) times daily with a meal. 09/07/22  Yes [name], NP  pravastatin (PRAVACHOL) 20 MG tablet Take 1 tablet by mouth once daily 04/05/23  Yes Edwards, Michelle P, NP  Na Sulfate-K Sulfate-Mg Sulf 17.5-3.13-1.6 GM/177ML SOLN Take 354 mLs by mouth as directed. Patient not taking: Reported on 05/21/2023 05/18/23   [provider]     Vital Signs: BP (!) 140/76 (BP Location: Left Arm)   Pulse 69   Temp 98.2 F (36.8 C) (Oral)   Resp 18   Ht 6\' 2"  (1.88 m)   Wt 195 lb 15.8 oz (88.9 kg)   SpO2 99%   BMI 25.16 kg/m   Physical Exam NAD, alert Abdomen: lower abdominal midline drain in place.  Site intact, clean, and dry.  Purulent, beige-appearing fluid in bulb. TG drain in place.  Site intact, clean, and dry.  Output with cloudy, purulent appearance although more thin than anterior drain.   Imaging: CT GUIDED VISCERAL FLUID DRAIN BY PERC CATH  Result Date: 05/22/2023 INDICATION: 62 year old male with multiple intra-abdominal abscesses and small-bowel ileus. 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 He presents for CT-guided abscess drain placement. EXAM: CT-guided drain  placement CT-guided drain placement TECHNIQUE: Multidetector CT imaging of the abdomen and pelvis was performed following the standard protocol without IV contrast. RADIATION DOSE REDUCTION: This exam was performed according to the departmental dose-optimization program which includes automated exposure control, adjustment of the mA and/or kV according to patient size and/or use of iterative reconstruction technique. MEDICATIONS: The patient is currently admitted to the hospital and receiving intravenous antibiotics. The antibiotics were administered within an appropriate time frame prior to the initiation of the procedure. ANESTHESIA/SEDATION: Moderate (conscious) sedation was employed during this procedure. A total of Versed 2 mg and Fentanyl 100 mcg was administered intravenously by the radiology nurse. Total intra-service moderate Sedation Time: 28 minutes. The patient's level of consciousness and vital signs were monitored continuously by radiology nursing throughout the procedure under my direct supervision. COMPLICATIONS: None immediate. PROCEDURE: Informed written consent was obtained from the patient after a thorough discussion of the procedural risks, benefits and alternatives. All questions were addressed. Maximal Sterile Barrier Technique was utilized including caps, mask, sterile gowns, sterile gloves, sterile drape, hand hygiene and skin antiseptic. A timeout was performed prior to the initiation of the procedure. Initial CT imaging was performed. The complex fluid collection in the low anterior abdomen was identified. The skin was sterilely prepped and draped in the standard fashion using chlorhexidine skin prep. Local anesthesia was attained by infiltration with 1% lidocaine. A small dermatotomy was made. Under intermittent CT guidance, an 18 gauge trocar needle was advanced into the fluid collection. A 0.035 wire was  then coiled in the fluid collection. The needle was removed. The percutaneous tract  was dilated to 12 [place]. A 12 French all-purpose drainage catheter was then advanced over the wire and formed. Aspiration yields 100 mL foul-smelling purulent fluid. A sample was sent for Gram stain and culture. The catheter was flushed with saline, connected to JP bulb suction and secured to the skin with 0 Prolene suture. Follow-up CT imaging demonstrates a well-positioned drainage catheter with significant resolution of the low anterior abdominal fluid collection. However, the separate pelvic cul-de-sac collection remains unchanged and undrained. Therefore, the patient was repositioned into the left lateral decubitus position. Repeat CT imaging was performed. The complex fluid collection in the pelvic cul-de-sac was identified. The skin was sterilely prepped and draped in standard fashion using chlorhexidine skin prep. Local anesthesia was attained by infiltration with 1% lidocaine. A small dermatotomy was made. Under intermittent CT guidance, an 18 gauge trocar needle was advanced into the fluid collection. A 0.035 wire was then coiled in the fluid collection. The needle was removed. The percutaneous tract was dilated to 12 [place]. A 12 French all-purpose drainage catheter was then advanced over the wire and formed. Aspiration yields 100 mL foul-smelling purulent fluid. A sample was sent for Gram stain and culture. The catheter was flushed with saline, connected to JP bulb suction and secured to the skin with 0 Prolene suture. Final CT imaging confirms resolution of fluid collections with well-positioned drainage catheter x2. IMPRESSION: 1. Placement of a 12 French drainage catheter into the low anterior abdominal fluid collection with aspiration of 100 mL foul-smelling purulent fluid which was sent for Gram stain and culture. 2. Placement of a 12 French drainage catheter via a left transgluteal approach into the pelvic cul-de-sac collection with aspiration of an additional 100 mL foul-smelling purulent fluid.  Electronically Signed   By: Malachy Moan M.D.   On: 05/22/2023 15:40   CT GUIDED PERITONEAL/RETROPERITONEAL FLUID DRAIN BY PERC CATH  Result Date: 05/22/2023 INDICATION: 62 year old male with multiple intra-abdominal abscesses and small-bowel ileus. 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 He presents for CT-guided abscess drain placement. EXAM: CT-guided drain placement CT-guided drain placement TECHNIQUE: Multidetector CT imaging of the abdomen and pelvis was performed following the standard protocol without IV contrast. RADIATION DOSE REDUCTION: This exam was performed according to the departmental dose-optimization program which includes automated exposure control, adjustment of the mA and/or kV according to patient size and/or use of iterative reconstruction technique. MEDICATIONS: The patient is currently admitted to the hospital and receiving intravenous antibiotics. The antibiotics were administered within an appropriate time frame prior to the initiation of the procedure. ANESTHESIA/SEDATION: Moderate (conscious) sedation was employed during this procedure. A total of Versed 2 mg and Fentanyl 100 mcg was administered intravenously by the radiology nurse. Total intra-service moderate Sedation Time: 28 minutes. The patient's level of consciousness and vital signs were monitored continuously by radiology nursing throughout the procedure under my direct supervision. COMPLICATIONS: None immediate. PROCEDURE: Informed written consent was obtained from the patient after a thorough discussion of the procedural risks, benefits and alternatives. All questions were addressed. Maximal Sterile Barrier Technique was utilized including caps, mask, sterile gowns, sterile gloves, sterile drape, hand hygiene and skin antiseptic. A timeout was performed prior to the initiation of the procedure. Initial CT imaging was performed. The complex fluid collection in the low anterior abdomen was identified.  The skin was sterilely prepped and draped in the standard fashion using chlorhexidine skin prep. Local  anesthesia was attained by infiltration with 1% lidocaine. A small dermatotomy was made. Under intermittent CT guidance, an 18 gauge trocar needle was advanced into the fluid collection. A 0.035 wire was then coiled in the fluid collection. The needle was removed. The percutaneous tract was dilated to 12 [place]. A 12 French all-purpose drainage catheter was then advanced over the wire and formed. Aspiration yields 100 mL foul-smelling purulent fluid. A sample was sent for Gram stain and culture. The catheter was flushed with saline, connected to JP bulb suction and secured to the skin with 0 Prolene suture. Follow-up CT imaging demonstrates a well-positioned drainage catheter with significant resolution of the low anterior abdominal fluid collection. However, the separate pelvic cul-de-sac collection remains unchanged and undrained. Therefore, the patient was repositioned into the left lateral decubitus position. Repeat CT imaging was performed. The complex fluid collection in the pelvic cul-de-sac was identified. The skin was sterilely prepped and draped in standard fashion using chlorhexidine skin prep. Local anesthesia was attained by infiltration with 1% lidocaine. A small dermatotomy was made. Under intermittent CT guidance, an 18 gauge trocar needle was advanced into the fluid collection. A 0.035 wire was then coiled in the fluid collection. The needle was removed. The percutaneous tract was dilated to 12 [place]. A 12 French all-purpose drainage catheter was then advanced over the wire and formed. Aspiration yields 100 mL foul-smelling purulent fluid. A sample was sent for Gram stain and culture. The catheter was flushed with saline, connected to JP bulb suction and secured to the skin with 0 Prolene suture. Final CT imaging confirms resolution of fluid collections with well-positioned drainage catheter x2.  IMPRESSION: 1. Placement of a 12 French drainage catheter into the low anterior abdominal fluid collection with aspiration of 100 mL foul-smelling purulent fluid which was sent for Gram stain and culture. 2. Placement of a 12 French drainage catheter via a left transgluteal approach into the pelvic cul-de-sac collection with aspiration of an additional 100 mL foul-smelling purulent fluid. Electronically Signed   By: Malachy Moan M.D.   On: 05/22/2023 15:40   CT ABDOMEN PELVIS W CONTRAST  Result Date: 05/21/2023 CLINICAL DATA:  Generalized abdominal pain. EXAM: CT ABDOMEN AND PELVIS WITH CONTRAST TECHNIQUE: Multidetector CT imaging of the abdomen and pelvis was performed using the standard protocol following bolus administration of intravenous contrast. RADIATION DOSE REDUCTION: This exam was performed according to the departmental dose-optimization program which includes automated exposure control, adjustment of the mA and/or kV according to patient size and/or use of iterative reconstruction technique. CONTRAST:  75mL OMNIPAQUE IOHEXOL 350 MG/ML SOLN COMPARISON:  None Available. FINDINGS: Lower chest: No acute abnormality. Hepatobiliary: No focal liver abnormality is seen. No gallstones, gallbladder wall thickening, or biliary dilatation. Pancreas: Unremarkable. No pancreatic ductal dilatation or surrounding inflammatory changes. Spleen: Normal in size without focal abnormality. Adrenals/Urinary Tract: Adrenal glands are unremarkable. Kidneys are normal, without renal calculi, focal lesion, or hydronephrosis. Bladder is unremarkable. Stomach/Bowel: Stomach is within normal limits. Appendix appears normal. There is a multi loculated fluid collection in the lower mid abdomen with multiple additional fluid collections which may reflect developing abscesses. For reference, the multiloculated collection along the superior aspect of the bladder measures approximately 7.7 x 8.2 cm (series 3, image 74). A rim  enhancing fluid collection in the pelvis measures approximately 9.7 x 4.7 cm (series 7, image 64). There is focal bowel wall thickening of the sigmoid colon, which may represent a colonic mass or focal diverticulitis, and is in close  proximity to the multiloculated collection (series 3, images 70-74). There are multiple loops of dilated small bowel with a possible transition to decompressed small bowel in the lower right abdomen. Vascular/Lymphatic: Aortic atherosclerosis. No enlarged abdominal or pelvic lymph nodes. Reproductive: Prostate is unremarkable. Other: A mass in the inferior anterior abdominal wall in the subcutaneous fat and abutting the skin surface measures 2.6 x 1.0 cm (series 6, image 93). No abdominal wall hernia. Musculoskeletal: Degenerative changes are seen in the spine. IMPRESSION: 1. Sigmoid colon bowel wall thickening may reflect a colonic mass or focal diverticulitis. Multiple rim enhancing fluid collections in the abdomen or pelvis likely reflect abscesses and suggest bowel perforation due to the colonic mass/focal diverticulitis. 2. Multiple loops of dilated small bowel with a possible transition to decompressed small bowel in the lower right abdomen. This may reflect ileus or small-bowel obstruction and is likely related to the multiple abdominal abscesses/intraperitoneal inflammatory process. Aortic Atherosclerosis (ICD10-I70.0). Electronically Signed   By: Romona Curls M.D.   On: 05/21/2023 15:00    Labs:  CBC: Recent Labs    12/23/22 1202 05/16/23 1449 05/21/23 1153 05/22/23 0033  WBC 7.8 7.6 8.7 7.3  HGB 13.7 15.4 15.6 13.7  HCT 42.1 44.3 43.8 38.3*  PLT 239 250 363 299    COAGS: Recent Labs    05/22/23 0033  INR 1.2    BMP: Recent Labs    12/23/22 1202 05/16/23 1449 05/21/23 1153 05/22/23 0033  NA 140 137 133* 136  K 3.9 3.0* 4.2 2.9*  CL 100 98 95* 98  CO2 23 23 24 27   GLUCOSE 105* 249* 210* 127*  BUN 11 11 23 18   CALCIUM 9.6 9.3 8.4* 7.9*   CREATININE 0.99 1.31* 1.09 1.07  GFRNONAA  --  >60 >60 >60    LIVER FUNCTION TESTS: Recent Labs    12/23/22 1202 05/16/23 1449 05/21/23 1153 05/22/23 0033  BILITOT 1.0 2.3* 2.0* 1.3*  AST 15 18 27 15   ALT 10 16 24 19   ALKPHOS 77 65 74 64  PROT 7.5 7.6 7.4 6.4*  ALBUMIN 4.3 3.6 2.7* 2.3*    Assessment and Plan: Sigmoid colon wall thickening, intra-abdominal fluid collections s/p drain placement x2 by Dr. Archer Asa 05/22/23 Patient assessed at bedside.  REports improvement since drain placement.  Afebrile. VSS.  Culture pending but with abundant gram neg rods.   Drain Location: low anterior midline Size: Fr size: 10 Fr Date of placement: 7/21  Currently to: Drain collection device: suction bulb  Drain Location: TG Size: Fr size: 10 Fr Date of placement: 7/21  Currently to: Drain collection device: suction bulb  24 hour output:  Output by Drain (mL) 05/21/23 0701 - 05/21/23 1900 05/21/23 1901 - 05/22/23 0700 05/22/23 0701 - 05/22/23 1900 05/22/23 1901 - 05/23/23 0700 05/23/23 0701 - 05/23/23 1637  Closed System Drain Inferior;Medial Abdomen 12 Fr.   50 60   Closed System Drain Inferior;Left Back 12 Fr.   40 60     Interval imaging/drain manipulation:  None  Current examination: Flushes/aspirates easily.  Insertion site unremarkable. Suture and stat lock in place. Dressed appropriately.  Both drains with purulent appearing output.   Plan: Continue TID flushes with 5 cc NS. Record output Q shift. Dressing changes QD or PRN if soiled.  Call IR APP or on call IR MD if difficulty flushing or sudden change in drain output.  Repeat imaging/possible drain injection once output < 10 mL/QD (excluding flush material). Consideration for drain removal if output  is < 10 mL/QD (excluding flush material), pending discussion with the providing surgical service.  Discharge planning: Please contact IR APP or on call IR MD prior to patient d/c to ensure appropriate follow up  plans are in place. Typically patient will follow up with IR clinic 10-14 days post d/c for repeat imaging/possible drain injection. IR scheduler will contact patient with date/time of appointment. Patient will need to flush drain QD with 5 cc NS, record output QD, dressing changes every 2-3 days or earlier if soiled.   IR will continue to follow - please call with questions or concerns.    Electronically Signed: Hoyt Koch, PA 05/23/2023, 4:33 PM   I spent a total of 15 Minutes at the the patient's bedside AND on the patient's hospital floor or unit, greater than 50% of which was counseling/coordinating care for intra-abdominal fluid collections.

## 2023-05-24 ENCOUNTER — Other Ambulatory Visit (HOSPITAL_COMMUNITY): Payer: Self-pay

## 2023-05-24 DIAGNOSIS — K651 Peritoneal abscess: Secondary | ICD-10-CM | POA: Diagnosis not present

## 2023-05-24 LAB — BASIC METABOLIC PANEL
Anion gap: 12 (ref 5–15)
BUN: 9 mg/dL (ref 8–23)
CO2: 26 mmol/L (ref 22–32)
Calcium: 7.5 mg/dL — ABNORMAL LOW (ref 8.9–10.3)
Chloride: 98 mmol/L (ref 98–111)
Creatinine, Ser: 1 mg/dL (ref 0.61–1.24)
GFR, Estimated: >60 mL/min (ref >60)
Glucose, Bld: 148 mg/dL — ABNORMAL HIGH (ref 70–99)
Potassium: 3.4 mmol/L — ABNORMAL LOW (ref 3.5–5.1)
Sodium: 136 mmol/L (ref 135–145)

## 2023-05-24 LAB — COMPREHENSIVE METABOLIC PANEL
ALT: 18 U/L (ref 0–44)
AST: 17 U/L (ref 15–41)
Albumin: 2 g/dL — ABNORMAL LOW (ref 3.5–5.0)
Alkaline Phosphatase: 60 U/L (ref 38–126)
Anion gap: 10 (ref 5–15)
BUN: 10 mg/dL (ref 8–23)
CO2: 24 mmol/L (ref 22–32)
Calcium: 7.4 mg/dL — ABNORMAL LOW (ref 8.9–10.3)
Chloride: 102 mmol/L (ref 98–111)
Creatinine, Ser: 0.95 mg/dL (ref 0.61–1.24)
GFR, Estimated: >60 mL/min (ref >60)
Glucose, Bld: 151 mg/dL — ABNORMAL HIGH (ref 70–99)
Potassium: 2.8 mmol/L — ABNORMAL LOW (ref 3.5–5.1)
Sodium: 136 mmol/L (ref 135–145)
Total Bilirubin: 0.7 mg/dL (ref 0.3–1.2)
Total Protein: 5.9 g/dL — ABNORMAL LOW (ref 6.5–8.1)

## 2023-05-24 LAB — CBC WITH DIFFERENTIAL/PLATELET
Abs Immature Granulocytes: 0.73 10*3/uL — ABNORMAL HIGH (ref 0.00–0.07)
Basophils Absolute: 0.1 10*3/uL (ref 0.0–0.1)
Basophils Relative: 1 %
Eosinophils Absolute: 0.1 10*3/uL (ref 0.0–0.5)
Eosinophils Relative: 1 %
HCT: 34.6 % — ABNORMAL LOW (ref 39.0–52.0)
Hemoglobin: 12.1 g/dL — ABNORMAL LOW (ref 13.0–17.0)
Immature Granulocytes: 6 %
Lymphocytes Relative: 14 %
Lymphs Abs: 1.7 10*3/uL (ref 0.7–4.0)
MCH: 28.4 pg (ref 26.0–34.0)
MCHC: 35 g/dL (ref 30.0–36.0)
MCV: 81.2 fL (ref 80.0–100.0)
Monocytes Absolute: 0.6 10*3/uL (ref 0.1–1.0)
Monocytes Relative: 5 %
Neutro Abs: 9.2 10*3/uL — ABNORMAL HIGH (ref 1.7–7.7)
Neutrophils Relative %: 73 %
Platelets: 307 10*3/uL (ref 150–400)
RBC: 4.26 MIL/uL (ref 4.22–5.81)
RDW: 14 % (ref 11.5–15.5)
WBC: 12.3 10*3/uL — ABNORMAL HIGH (ref 4.0–10.5)
nRBC: 0 % (ref 0.0–0.2)

## 2023-05-24 LAB — AEROBIC/ANAEROBIC CULTURE W GRAM STAIN (SURGICAL/DEEP WOUND)

## 2023-05-24 LAB — GLUCOSE, CAPILLARY
Glucose-Capillary: 162 mg/dL — ABNORMAL HIGH (ref 70–99)
Glucose-Capillary: 219 mg/dL — ABNORMAL HIGH (ref 70–99)

## 2023-05-24 MED ORDER — POTASSIUM CHLORIDE CRYS ER 20 MEQ PO TBCR
40.0000 meq | EXTENDED_RELEASE_TABLET | ORAL | Status: AC
  Administered 2023-05-24 (×2): 40 meq via ORAL
  Filled 2023-05-24 (×2): qty 2

## 2023-05-24 MED ORDER — AMOXICILLIN-POT CLAVULANATE 875-125 MG PO TABS
1.0000 | ORAL_TABLET | Freq: Two times a day (BID) | ORAL | 0 refills | Status: AC
  Filled 2023-05-24: qty 14, 7d supply, fill #0

## 2023-05-24 MED ORDER — AMOXICILLIN-POT CLAVULANATE 875-125 MG PO TABS: 1.0000 | ORAL_TABLET | Freq: Two times a day (BID) | ORAL | Status: DC

## 2023-05-24 MED ORDER — POTASSIUM CHLORIDE CRYS ER 20 MEQ PO TBCR
40.0000 meq | EXTENDED_RELEASE_TABLET | Freq: Once | ORAL | Status: AC
  Administered 2023-05-24: 40 meq via ORAL
  Filled 2023-05-24: qty 2

## 2023-05-24 MED ORDER — SODIUM CHLORIDE FLUSH 0.9 % IV SOLN
10.0000 mL | Freq: Every day | INTRAVENOUS | 0 refills | Status: AC
  Filled 2023-05-24: qty 300, 30d supply, fill #0

## 2023-05-24 NOTE — Progress Notes (Signed)
[facility] Progress Note     Subjective: CC:  Denies pain, just some soreness around drain site. Tolerating PO. He is unsure how many BMs he had yesterday but states it was around 3.   Objective: Vital signs in last 24 hours: Temp:  [98.7 F (37.1 C)-99.1 F (37.3 C)] 98.7 F (37.1 C) (07/23 0803) Pulse Rate:  [67-77] 77 (07/23 0803) Resp:  [17-18] 18 (07/23 0803) BP: (123-131)/(65-72) 131/65 (07/23 0803) SpO2:  [98 %-100 %] 98 % (07/23 0803) Weight:  [87.3 kg] 87.3 kg (07/23 0456) Last BM Date : 05/23/23  Intake/Output from previous day: 07/22 0701 - 07/23 0700 In: 220 [IV Piggyback:200] Out: 90 [Drains:90] Intake/Output this shift: No intake/output data recorded.  PE: Gen:  Alert, NAD, pleasant Card:  Regular rate and rhythm Pulm:  Normal effort ORA Abd: Soft, mild distention, appropraitely tender around drain  Abd JP - 55 cc purulent  Transgluteal JP - 35 cc purulent  Skin: warm and dry, no rashes  Psych: A&Ox3   Lab Results:  Recent Labs    05/22/23 0033 05/24/23 0340  WBC 7.3 12.3*  HGB 13.7 12.1*  HCT 38.3* 34.6*  PLT 299 307   BMET Recent Labs    05/22/23 0033 05/24/23 0340  NA 136 136  K 2.9* 2.8*  CL 98 102  CO2 27 24  GLUCOSE 127* 151*  BUN 18 10  CREATININE 1.07 0.95  CALCIUM 7.9* 7.4*   PT/INR Recent Labs    05/22/23 0033  LABPROT 15.4*  INR 1.2   CMP     Component Value Date/Time   NA 136 05/24/2023 0340   NA 140 12/23/2022 1202   K 2.8 (L) 05/24/2023 0340   CL 102 05/24/2023 0340   CO2 24 05/24/2023 0340   GLUCOSE 151 (H) 05/24/2023 0340   BUN 10 05/24/2023 0340   BUN 11 12/23/2022 1202   CREATININE 0.95 05/24/2023 0340   CALCIUM 7.4 (L) 05/24/2023 0340   PROT 5.9 (L) 05/24/2023 0340   PROT 7.5 12/23/2022 1202   ALBUMIN 2.0 (L) 05/24/2023 0340   ALBUMIN 4.3 12/23/2022 1202   AST 17 05/24/2023 0340   ALT 18 05/24/2023 0340   ALKPHOS 60 05/24/2023 0340   BILITOT 0.7 05/24/2023 0340   BILITOT 1.0  12/23/2022 1202   GFRNONAA >60 05/24/2023 0340   Lipase     Component Value Date/Time   LIPASE 29 05/21/2023 1153    Studies/Results: CT GUIDED VISCERAL FLUID DRAIN BY PERC CATH  Result Date: 05/22/2023 INDICATION: 62 year old male with multiple intra-abdominal abscesses and small-bowel ileus. 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 He presents for CT-guided abscess drain placement. EXAM: CT-guided drain placement CT-guided drain placement TECHNIQUE: Multidetector CT imaging of the abdomen and pelvis was performed following the standard protocol without IV contrast. RADIATION DOSE REDUCTION: This exam was performed according to the departmental dose-optimization program which includes automated exposure control, adjustment of the mA and/or kV according to patient size and/or use of iterative reconstruction technique. MEDICATIONS: The patient is currently admitted to the hospital and receiving intravenous antibiotics. The antibiotics were administered within an appropriate time frame prior to the initiation of the procedure. ANESTHESIA/SEDATION: Moderate (conscious) sedation was employed during this procedure. A total of Versed 2 mg and Fentanyl 100 mcg was administered intravenously by the radiology nurse. Total intra-service moderate Sedation Time: 28 minutes. The patient's level of consciousness and vital signs were monitored continuously by radiology nursing throughout the procedure under my  direct supervision. COMPLICATIONS: None immediate. PROCEDURE: Informed written consent was obtained from the patient after a thorough discussion of the procedural risks, benefits and alternatives. All questions were addressed. Maximal Sterile Barrier Technique was utilized including caps, mask, sterile gowns, sterile gloves, sterile drape, hand hygiene and skin antiseptic. A timeout was performed prior to the initiation of the procedure. Initial CT imaging was performed. The complex fluid  collection in the low anterior abdomen was identified. The skin was sterilely prepped and draped in the standard fashion using chlorhexidine skin prep. Local anesthesia was attained by infiltration with 1% lidocaine. A small dermatotomy was made. Under intermittent CT guidance, an 18 gauge trocar needle was advanced into the fluid collection. A 0.035 wire was then coiled in the fluid collection. The needle was removed. The percutaneous tract was dilated to 12 [place]. A 12 French all-purpose drainage catheter was then advanced over the wire and formed. Aspiration yields 100 mL foul-smelling purulent fluid. A sample was sent for Gram stain and culture. The catheter was flushed with saline, connected to JP bulb suction and secured to the skin with 0 Prolene suture. Follow-up CT imaging demonstrates a well-positioned drainage catheter with significant resolution of the low anterior abdominal fluid collection. However, the separate pelvic cul-de-sac collection remains unchanged and undrained. Therefore, the patient was repositioned into the left lateral decubitus position. Repeat CT imaging was performed. The complex fluid collection in the pelvic cul-de-sac was identified. The skin was sterilely prepped and draped in standard fashion using chlorhexidine skin prep. Local anesthesia was attained by infiltration with 1% lidocaine. A small dermatotomy was made. Under intermittent CT guidance, an 18 gauge trocar needle was advanced into the fluid collection. A 0.035 wire was then coiled in the fluid collection. The needle was removed. The percutaneous tract was dilated to 12 [place]. A 12 French all-purpose drainage catheter was then advanced over the wire and formed. Aspiration yields 100 mL foul-smelling purulent fluid. A sample was sent for Gram stain and culture. The catheter was flushed with saline, connected to JP bulb suction and secured to the skin with 0 Prolene suture. Final CT imaging confirms resolution of fluid  collections with well-positioned drainage catheter x2. IMPRESSION: 1. Placement of a 12 French drainage catheter into the low anterior abdominal fluid collection with aspiration of 100 mL foul-smelling purulent fluid which was sent for Gram stain and culture. 2. Placement of a 12 French drainage catheter via a left transgluteal approach into the pelvic cul-de-sac collection with aspiration of an additional 100 mL foul-smelling purulent fluid. Electronically Signed   By: Malachy Moan M.D.   On: 05/22/2023 15:40   CT GUIDED PERITONEAL/RETROPERITONEAL FLUID DRAIN BY PERC CATH  Result Date: 05/22/2023 INDICATION: 62 year old male with multiple intra-abdominal abscesses and small-bowel ileus. 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 He presents for CT-guided abscess drain placement. EXAM: CT-guided drain placement CT-guided drain placement TECHNIQUE: Multidetector CT imaging of the abdomen and pelvis was performed following the standard protocol without IV contrast. RADIATION DOSE REDUCTION: This exam was performed according to the departmental dose-optimization program which includes automated exposure control, adjustment of the mA and/or kV according to patient size and/or use of iterative reconstruction technique. MEDICATIONS: The patient is currently admitted to the hospital and receiving intravenous antibiotics. The antibiotics were administered within an appropriate time frame prior to the initiation of the procedure. ANESTHESIA/SEDATION: Moderate (conscious) sedation was employed during this procedure. A total of Versed 2 mg and Fentanyl 100 mcg was administered  intravenously by the radiology nurse. Total intra-service moderate Sedation Time: 28 minutes. The patient's level of consciousness and vital signs were monitored continuously by radiology nursing throughout the procedure under my direct supervision. COMPLICATIONS: None immediate. PROCEDURE: Informed written consent was obtained  from the patient after a thorough discussion of the procedural risks, benefits and alternatives. All questions were addressed. Maximal Sterile Barrier Technique was utilized including caps, mask, sterile gowns, sterile gloves, sterile drape, hand hygiene and skin antiseptic. A timeout was performed prior to the initiation of the procedure. Initial CT imaging was performed. The complex fluid collection in the low anterior abdomen was identified. The skin was sterilely prepped and draped in the standard fashion using chlorhexidine skin prep. Local anesthesia was attained by infiltration with 1% lidocaine. A small dermatotomy was made. Under intermittent CT guidance, an 18 gauge trocar needle was advanced into the fluid collection. A 0.035 wire was then coiled in the fluid collection. The needle was removed. The percutaneous tract was dilated to 12 [place]. A 12 French all-purpose drainage catheter was then advanced over the wire and formed. Aspiration yields 100 mL foul-smelling purulent fluid. A sample was sent for Gram stain and culture. The catheter was flushed with saline, connected to JP bulb suction and secured to the skin with 0 Prolene suture. Follow-up CT imaging demonstrates a well-positioned drainage catheter with significant resolution of the low anterior abdominal fluid collection. However, the separate pelvic cul-de-sac collection remains unchanged and undrained. Therefore, the patient was repositioned into the left lateral decubitus position. Repeat CT imaging was performed. The complex fluid collection in the pelvic cul-de-sac was identified. The skin was sterilely prepped and draped in standard fashion using chlorhexidine skin prep. Local anesthesia was attained by infiltration with 1% lidocaine. A small dermatotomy was made. Under intermittent CT guidance, an 18 gauge trocar needle was advanced into the fluid collection. A 0.035 wire was then coiled in the fluid collection. The needle was removed. The  percutaneous tract was dilated to 12 [place]. A 12 French all-purpose drainage catheter was then advanced over the wire and formed. Aspiration yields 100 mL foul-smelling purulent fluid. A sample was sent for Gram stain and culture. The catheter was flushed with saline, connected to JP bulb suction and secured to the skin with 0 Prolene suture. Final CT imaging confirms resolution of fluid collections with well-positioned drainage catheter x2. IMPRESSION: 1. Placement of a 12 French drainage catheter into the low anterior abdominal fluid collection with aspiration of 100 mL foul-smelling purulent fluid which was sent for Gram stain and culture. 2. Placement of a 12 French drainage catheter via a left transgluteal approach into the pelvic cul-de-sac collection with aspiration of an additional 100 mL foul-smelling purulent fluid. Electronically Signed   By: Malachy Moan M.D.   On: 05/22/2023 15:40    Anti-infectives: Anti-infectives (From admission, onward)    Start     Dose/Rate Route Frequency Ordered Stop   05/22/23 1000  cefTRIAXone (ROCEPHIN) 2 g in sodium chloride 0.9 % 100 mL IVPB        2 g 200 mL/hr over 30 Minutes Intravenous Every 24 hours 05/21/23 1723     05/22/23 0500  metroNIDAZOLE (FLAGYL) IVPB 500 mg        500 mg 100 mL/hr over 60 Minutes Intravenous Every 12 hours 05/21/23 1723     05/21/23 1515  cefTRIAXone (ROCEPHIN) 1 g in sodium chloride 0.9 % 100 mL IVPB        1 g 200  mL/hr over 30 Minutes Intravenous  Once 05/21/23 1505 05/21/23 1728   05/21/23 1515  metroNIDAZOLE (FLAGYL) IVPB 500 mg        500 mg 100 mL/hr over 60 Minutes Intravenous  Once 05/21/23 1505 05/21/23 1844        Assessment/Plan  62 year old male with wall thickening of the sigmoid colon and intra-abdominal fluid collections - afebrile, VSS, WBC increased from 7 to 12 - s/p placement 2, 70F IR drains 7/21, cultures pending (GPC, GNR, GPR on GS) - IV Rocephin/Flagyl   - soft diet. No signs of bowel  obstruction. - no emergent role for surgery. Continue IV abx. Clinically he looks well - afebrile, normal vitals, minimal abdominal pain. WBC did increase a significant amount. Since cultures are not back it may be reasonable to keep him here on IV abx another day, re-check WBC tomorrow AM, and then if he stiil looks well discharge tomorrow. Will discuss with my attending.  HTN DM    LOS: 3 days   I reviewed nursing notes, Consultant IR notes, hospitalist notes, last 24 h vitals and pain scores, last 48 h intake and output, last 24 h labs and trends, and last 24 h imaging results.  This care required moderate level of medical decision making.   Hosie Spangle, PA-C [facility] Please see Amion for pager number during day hours 7:00am-4:30pm

## 2023-05-24 NOTE — Discharge Instructions (Signed)
To flush the drains:  Disconnect the bulb catheter from the portion of the catheter that goes into the body. Once disconnected attach the saline flush by screwing into the portion of the catheter leading into the body. Gently flush with 5 ml saline. Disconnect the saline flush and reconnect the bulb catheter. Try to not let any parts of the catheter (while disconnected) touch anything to maintain cleanliness and reduce the risk of infection.

## 2023-05-24 NOTE — Progress Notes (Signed)
All discharge instructions explained to patient and s/o at bedside. Reinforced drain education including site care (cleaning), stripping of the drain, emptying of the drain, flushing of the drain. Additional supplied provided to patient including (alcohol swabs, drain sponges, tape, urine cups to measure output). Patient and s/o were asked to teach this RN how to flush drain and successfully demonstrated understanding. Medications given to patient. Discharge paperwork given to patient. Awaiting final lab results. Will discontinue PIV when results received.

## 2023-05-24 NOTE — Discharge Summary (Signed)
Physician Discharge Summary   Patient: Duane [name] MRN: [id number] DOB: [birth year]  Admit date:     05/21/2023  Discharge date: 05/24/23  Discharge Physician: Jonah Blue   PCP: [name], NP   Recommendations at discharge:   Take Augmentin twice daily until gone (7 more days) IR and surgery will contact you with follow up appointments Hold hydrochlorothiazide until follow up appointment Follow up with Gwinda Passe in 1-2 weeks for recheck and repeat BMP Follow up with GI for colonoscopy on 8/6 as scheduled Drain care as instructed  Discharge Diagnoses: Principal Problem:   Intra-abdominal abscess (HCC) Active Problems:   HTN (hypertension)   Diabetes mellitus type 2 in nonobese [facility])   Dyslipidemia    Hospital Course: 61yo with h/o HTN and DM who presented on 7/20 with abdominal pain.  CT with colonic wall thickening concerning for mass vs. Diverticulitis with multiple fluid collections suspicious for abscesses.  Also with SBO vs. Ileus.  Surgery consulted, recommended IR drainage.  On Rocephin, Flagyl.  Had infraumbilical and L transgluteal drains placed on 7/21.  Assessment and Plan:  Intra-abdominal abscess Diverticulitis versus mass Patient presenting with abdominal pain with recent n/v (last week but not now), also with constipation with intermittent diarrhea that is nonbloody CT abdomen pelvis with colonic wall thickening concerning for mass versus diverticulitis as well as multiple fluid-filled collection concerning for abscesses raising suspicion for possible bowel perforation at some point.  Imaging also noted possible SBO versus ileus. General surgery consulted in the ED and recommend IR drainage and will follow.   He has never had a colonoscopy, scheduled for 8/6 Started on ceftriaxone and metronidazole - will transition to PO antibiotics and he is appropriate for dc Gram stain with abundant WBC, abundant GPC and GNR; culture with abundant E  coli, pansensitive, reincubating Surgery consulted and recommended IR percutaneous drainage  IR consulted and placed drains to JP x 48 hours and then switch to bags with drain flushing q8h He reports resolution of pain, no fever, generally feeling better Advancing diet without difficulty Per pharmacy, will treat with Augmentin BID x 7 additional days   HTN Continue amlodipine Continue to hold hydrochlorothiazide pending PCP follow up with repeat BMP in 1-2 weeks   DM Prior A1c was 6.4 in 2/204, good control Resume Glucophage   HLD Continue pravastatin     Consultants: Surgery IR   Procedures: Placement of infraumbilical and L transgluteal drains   Antibiotics: Ceftriaxone 7/20-7/23 Metronidazole 7/20-7/23       30 Day Unplanned Readmission Risk Score     Flowsheet Row ED to Hosp-Admission (Current) from 05/21/2023 in [facility]  SURGICAL  30 Day Unplanned Readmission Risk Score (%) 7.95 Filed at 05/23/2023 0401           This score is the patient's risk of an unplanned readmission within 30 days of being discharged (0 -100%). The score is based on dignosis, age, lab data, medications, orders, and past utilization.   Low:  0-14.9   Medium: 15-21.9   High: 22-29.9   Extreme: 30 and above           Disposition: Home Diet recommendation:  Carb modified diet DISCHARGE MEDICATION: Allergies as of 05/24/2023       Reactions   Codeine Other (See Comments)   "I get jittery."         Medication List     STOP taking these medications    hydrochlorothiazide 25 MG  tablet Commonly known as: HYDRODIURIL       TAKE these medications    amLODipine 10 MG tablet Commonly known as: NORVASC Take 1 tablet (10 mg total) by mouth daily.   amoxicillin-clavulanate 875-125 MG tablet Commonly known as: AUGMENTIN Take 1 tablet by mouth every 12 (twelve) hours for 7 days.   BD PosiFlush 0.9 % Soln injection Generic drug: sodium chloride  flush Flush abscess drains with 5 ml daily.  Syringes are one time use.   metFORMIN 500 MG tablet Commonly known as: GLUCOPHAGE Take 1 tablet (500 mg total) by mouth 2 (two) times daily with a meal.   Na Sulfate-K Sulfate-Mg Sulf 17.5-3.13-1.6 GM/177ML Soln Take 354 mLs by mouth as directed.   pravastatin 20 MG tablet Commonly known as: PRAVACHOL Take 1 tablet by mouth once daily               Discharge Care Instructions  (From admission, onward)           Start     Ordered   05/24/23 0000  Discharge wound care:       Comments: Drain care as per IR   05/24/23 1349            Follow-up Information     [facility] Follow up.   Why: Schedulers will contact you with date and time of follow-up appointment.  Expected 1-2 weeks after discharge. Contact information: [address] [place] [address] [phone]         Axel Filler, MD Follow up.   Specialty: General Surgery Contact information: [address] [place] [address] [phone]                Discharge Exam: [name] Weights   05/21/23 2038 05/23/23 0336 05/24/23 0456  Weight: 86.9 kg 88.9 kg 87.3 kg    Subjective: He is feeling well.  He feels ready to go home today.   Objective: Vitals:   05/24/23 0454 05/24/23 0803  BP: 123/72 131/65  Pulse: 68 77  Resp: 17 18  Temp: 98.9 F (37.2 C) 98.7 F (37.1 C)  SpO2: 99% 98%    Intake/Output Summary (Last 24 hours) at 05/24/2023 1350 Last data filed at 05/24/2023 0600 Gross per 24 hour  Intake 220 ml  Output 90 ml  Net 130 ml   Filed Weights   05/21/23 2038 05/23/23 0336 05/24/23 0456  Weight: 86.9 kg 88.9 kg 87.3 kg    Exam:  General:  Appears calm and comfortable and is in NAD Eyes:   EOMI, normal lids, iris ENT:  grossly normal hearing, lips & tongue, mmm; suboptimal dentition Neck:  no LAD, masses or thyromegaly Cardiovascular:  RRR, no m/r/g. No LE edema.   Respiratory:   CTA bilaterally with no wheezes/rales/rhonchi.  Normal respiratory effort. Abdomen:  soft, mildly tender, drains in place Skin:  no rash or induration seen on limited exam Musculoskeletal:  grossly normal tone BUE/BLE, good ROM, no bony abnormality Psychiatric:  grossly normal mood and affect, speech fluent and appropriate, AOx3 Neurologic:  CN 2-12 grossly intact, moves all extremities in coordinated fashion  Data Reviewed: I have reviewed the patient's lab results since admission.  Pertinent labs for today include:  K+ 2.8 Glucose 151 Albumin 2.0 WBC 12.3 Hgb 12.1     Condition at discharge: stable  The results of significant diagnostics from this hospitalization (including imaging, microbiology, ancillary and laboratory) are listed below for reference.  Imaging Studies: CT GUIDED VISCERAL FLUID DRAIN BY PERC CATH  Result Date: 05/22/2023 INDICATION: 62 year old male with multiple intra-abdominal abscesses and small-bowel ileus. 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 He presents for CT-guided abscess drain placement. EXAM: CT-guided drain placement CT-guided drain placement TECHNIQUE: Multidetector CT imaging of the abdomen and pelvis was performed following the standard protocol without IV contrast. RADIATION DOSE REDUCTION: This exam was performed according to the departmental dose-optimization program which includes automated exposure control, adjustment of the mA and/or kV according to patient size and/or use of iterative reconstruction technique. MEDICATIONS: The patient is currently admitted to the hospital and receiving intravenous antibiotics. The antibiotics were administered within an appropriate time frame prior to the initiation of the procedure. ANESTHESIA/SEDATION: Moderate (conscious) sedation was employed during this procedure. A total of Versed 2 mg and Fentanyl 100 mcg was administered intravenously by the radiology nurse. Total  intra-service moderate Sedation Time: 28 minutes. The patient's level of consciousness and vital signs were monitored continuously by radiology nursing throughout the procedure under my direct supervision. COMPLICATIONS: None immediate. PROCEDURE: Informed written consent was obtained from the patient after a thorough discussion of the procedural risks, benefits and alternatives. All questions were addressed. Maximal Sterile Barrier Technique was utilized including caps, mask, sterile gowns, sterile gloves, sterile drape, hand hygiene and skin antiseptic. A timeout was performed prior to the initiation of the procedure. Initial CT imaging was performed. The complex fluid collection in the low anterior abdomen was identified. The skin was sterilely prepped and draped in the standard fashion using chlorhexidine skin prep. Local anesthesia was attained by infiltration with 1% lidocaine. A small dermatotomy was made. Under intermittent CT guidance, an 18 gauge trocar needle was advanced into the fluid collection. A 0.035 wire was then coiled in the fluid collection. The needle was removed. The percutaneous tract was dilated to 12 [place]. A 12 French all-purpose drainage catheter was then advanced over the wire and formed. Aspiration yields 100 mL foul-smelling purulent fluid. A sample was sent for Gram stain and culture. The catheter was flushed with saline, connected to JP bulb suction and secured to the skin with 0 Prolene suture. Follow-up CT imaging demonstrates a well-positioned drainage catheter with significant resolution of the low anterior abdominal fluid collection. However, the separate pelvic cul-de-sac collection remains unchanged and undrained. Therefore, the patient was repositioned into the left lateral decubitus position. Repeat CT imaging was performed. The complex fluid collection in the pelvic cul-de-sac was identified. The skin was sterilely prepped and draped in standard fashion using chlorhexidine  skin prep. Local anesthesia was attained by infiltration with 1% lidocaine. A small dermatotomy was made. Under intermittent CT guidance, an 18 gauge trocar needle was advanced into the fluid collection. A 0.035 wire was then coiled in the fluid collection. The needle was removed. The percutaneous tract was dilated to 12 [place]. A 12 French all-purpose drainage catheter was then advanced over the wire and formed. Aspiration yields 100 mL foul-smelling purulent fluid. A sample was sent for Gram stain and culture. The catheter was flushed with saline, connected to JP bulb suction and secured to the skin with 0 Prolene suture. Final CT imaging confirms resolution of fluid collections with well-positioned drainage catheter x2. IMPRESSION: 1. Placement of a 12 French drainage catheter into the low anterior abdominal fluid collection with aspiration of 100 mL foul-smelling purulent fluid which was sent for Gram stain and culture. 2. Placement of a 12 French drainage catheter via a left  transgluteal approach into the pelvic cul-de-sac collection with aspiration of an additional 100 mL foul-smelling purulent fluid. Electronically Signed   By: Malachy Moan M.D.   On: 05/22/2023 15:40   CT GUIDED PERITONEAL/RETROPERITONEAL FLUID DRAIN BY PERC CATH  Result Date: 05/22/2023 INDICATION: 62 year old male with multiple intra-abdominal abscesses and small-bowel ileus. 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 He presents for CT-guided abscess drain placement. EXAM: CT-guided drain placement CT-guided drain placement TECHNIQUE: Multidetector CT imaging of the abdomen and pelvis was performed following the standard protocol without IV contrast. RADIATION DOSE REDUCTION: This exam was performed according to the departmental dose-optimization program which includes automated exposure control, adjustment of the mA and/or kV according to patient size and/or use of iterative reconstruction technique. MEDICATIONS:  The patient is currently admitted to the hospital and receiving intravenous antibiotics. The antibiotics were administered within an appropriate time frame prior to the initiation of the procedure. ANESTHESIA/SEDATION: Moderate (conscious) sedation was employed during this procedure. A total of Versed 2 mg and Fentanyl 100 mcg was administered intravenously by the radiology nurse. Total intra-service moderate Sedation Time: 28 minutes. The patient's level of consciousness and vital signs were monitored continuously by radiology nursing throughout the procedure under my direct supervision. COMPLICATIONS: None immediate. PROCEDURE: Informed written consent was obtained from the patient after a thorough discussion of the procedural risks, benefits and alternatives. All questions were addressed. Maximal Sterile Barrier Technique was utilized including caps, mask, sterile gowns, sterile gloves, sterile drape, hand hygiene and skin antiseptic. A timeout was performed prior to the initiation of the procedure. Initial CT imaging was performed. The complex fluid collection in the low anterior abdomen was identified. The skin was sterilely prepped and draped in the standard fashion using chlorhexidine skin prep. Local anesthesia was attained by infiltration with 1% lidocaine. A small dermatotomy was made. Under intermittent CT guidance, an 18 gauge trocar needle was advanced into the fluid collection. A 0.035 wire was then coiled in the fluid collection. The needle was removed. The percutaneous tract was dilated to 12 [place]. A 12 French all-purpose drainage catheter was then advanced over the wire and formed. Aspiration yields 100 mL foul-smelling purulent fluid. A sample was sent for Gram stain and culture. The catheter was flushed with saline, connected to JP bulb suction and secured to the skin with 0 Prolene suture. Follow-up CT imaging demonstrates a well-positioned drainage catheter with significant resolution of the  low anterior abdominal fluid collection. However, the separate pelvic cul-de-sac collection remains unchanged and undrained. Therefore, the patient was repositioned into the left lateral decubitus position. Repeat CT imaging was performed. The complex fluid collection in the pelvic cul-de-sac was identified. The skin was sterilely prepped and draped in standard fashion using chlorhexidine skin prep. Local anesthesia was attained by infiltration with 1% lidocaine. A small dermatotomy was made. Under intermittent CT guidance, an 18 gauge trocar needle was advanced into the fluid collection. A 0.035 wire was then coiled in the fluid collection. The needle was removed. The percutaneous tract was dilated to 12 [place]. A 12 French all-purpose drainage catheter was then advanced over the wire and formed. Aspiration yields 100 mL foul-smelling purulent fluid. A sample was sent for Gram stain and culture. The catheter was flushed with saline, connected to JP bulb suction and secured to the skin with 0 Prolene suture. Final CT imaging confirms resolution of fluid collections with well-positioned drainage catheter x2. IMPRESSION: 1. Placement of a 12 French drainage catheter into the low  anterior abdominal fluid collection with aspiration of 100 mL foul-smelling purulent fluid which was sent for Gram stain and culture. 2. Placement of a 12 French drainage catheter via a left transgluteal approach into the pelvic cul-de-sac collection with aspiration of an additional 100 mL foul-smelling purulent fluid. Electronically Signed   By: Malachy Moan M.D.   On: 05/22/2023 15:40   CT ABDOMEN PELVIS W CONTRAST  Result Date: 05/21/2023 CLINICAL DATA:  Generalized abdominal pain. EXAM: CT ABDOMEN AND PELVIS WITH CONTRAST TECHNIQUE: Multidetector CT imaging of the abdomen and pelvis was performed using the standard protocol following bolus administration of intravenous contrast. RADIATION DOSE REDUCTION: This exam was performed  according to the departmental dose-optimization program which includes automated exposure control, adjustment of the mA and/or kV according to patient size and/or use of iterative reconstruction technique. CONTRAST:  75mL OMNIPAQUE IOHEXOL 350 MG/ML SOLN COMPARISON:  None Available. FINDINGS: Lower chest: No acute abnormality. Hepatobiliary: No focal liver abnormality is seen. No gallstones, gallbladder wall thickening, or biliary dilatation. Pancreas: Unremarkable. No pancreatic ductal dilatation or surrounding inflammatory changes. Spleen: Normal in size without focal abnormality. Adrenals/Urinary Tract: Adrenal glands are unremarkable. Kidneys are normal, without renal calculi, focal lesion, or hydronephrosis. Bladder is unremarkable. Stomach/Bowel: Stomach is within normal limits. Appendix appears normal. There is a multi loculated fluid collection in the lower mid abdomen with multiple additional fluid collections which may reflect developing abscesses. For reference, the multiloculated collection along the superior aspect of the bladder measures approximately 7.7 x 8.2 cm (series 3, image 74). A rim enhancing fluid collection in the pelvis measures approximately 9.7 x 4.7 cm (series 7, image 64). There is focal bowel wall thickening of the sigmoid colon, which may represent a colonic mass or focal diverticulitis, and is in close proximity to the multiloculated collection (series 3, images 70-74). There are multiple loops of dilated small bowel with a possible transition to decompressed small bowel in the lower right abdomen. Vascular/Lymphatic: Aortic atherosclerosis. No enlarged abdominal or pelvic lymph nodes. Reproductive: Prostate is unremarkable. Other: A mass in the inferior anterior abdominal wall in the subcutaneous fat and abutting the skin surface measures 2.6 x 1.0 cm (series 6, image 93). No abdominal wall hernia. Musculoskeletal: Degenerative changes are seen in the spine. IMPRESSION: 1. Sigmoid  colon bowel wall thickening may reflect a colonic mass or focal diverticulitis. Multiple rim enhancing fluid collections in the abdomen or pelvis likely reflect abscesses and suggest bowel perforation due to the colonic mass/focal diverticulitis. 2. Multiple loops of dilated small bowel with a possible transition to decompressed small bowel in the lower right abdomen. This may reflect ileus or small-bowel obstruction and is likely related to the multiple abdominal abscesses/intraperitoneal inflammatory process. Aortic Atherosclerosis (ICD10-I70.0). Electronically Signed   By: Romona Curls M.D.   On: 05/21/2023 15:00    Microbiology: Results for orders placed or performed during the hospital encounter of 05/21/23  Aerobic/Anaerobic Culture w Gram Stain (surgical/deep wound)     Status: None (Preliminary result)   Collection Time: 05/22/23 12:51 PM   Specimen: Abscess  Result Value Ref Range Status   Specimen Description ABSCESS  Final   Special Requests Normal  Final   Gram Stain   Final    ABUNDANT WBC PRESENT, PREDOMINANTLY PMN ABUNDANT GRAM POSITIVE COCCI ABUNDANT GRAM NEGATIVE RODS FEW GRAM POSITIVE RODS    Culture   Final    ABUNDANT ESCHERICHIA COLI CULTURE REINCUBATED FOR BETTER GROWTH Performed at [facility], [address]  [place], [place] [address]    Report Status PENDING  Incomplete   Organism ID, Bacteria ESCHERICHIA COLI  Final      Susceptibility   Escherichia coli - MIC*    AMPICILLIN 8 SENSITIVE Sensitive     CEFEPIME <=0.12 SENSITIVE Sensitive     CEFTAZIDIME <=1 SENSITIVE Sensitive     CEFTRIAXONE <=0.25 SENSITIVE Sensitive     CIPROFLOXACIN <=0.25 SENSITIVE Sensitive     GENTAMICIN <=1 SENSITIVE Sensitive     IMIPENEM <=0.25 SENSITIVE Sensitive     TRIMETH/SULFA <=20 SENSITIVE Sensitive     AMPICILLIN/SULBACTAM 4 SENSITIVE Sensitive     PIP/TAZO <=4 SENSITIVE Sensitive     * ABUNDANT ESCHERICHIA COLI    Labs: CBC: Recent Labs  Lab 05/21/23 1153  05/22/23 0033 05/24/23 0340  WBC 8.7 7.3 12.3*  NEUTROABS  --   --  9.2*  HGB 15.6 13.7 12.1*  HCT 43.8 38.3* 34.6*  MCV 82.3 80.5 81.2  PLT 363 299 307   Basic Metabolic Panel: Recent Labs  Lab 05/21/23 1153 05/22/23 0032 05/22/23 0033 05/24/23 0340  NA 133*  --  136 136  K 4.2  --  2.9* 2.8*  CL 95*  --  98 102  CO2 24  --  27 24  GLUCOSE 210*  --  127* 151*  BUN 23  --  18 10  CREATININE 1.09  --  1.07 0.95  CALCIUM 8.4*  --  7.9* 7.4*  MG  --  2.6*  --   --    Liver Function Tests: Recent Labs  Lab 05/21/23 1153 05/22/23 0033 05/24/23 0340  AST 27 15 17   ALT 24 19 18   ALKPHOS 74 64 60  BILITOT 2.0* 1.3* 0.7  PROT 7.4 6.4* 5.9*  ALBUMIN 2.7* 2.3* 2.0*   CBG: Recent Labs  Lab 05/23/23 1239 05/23/23 1850 05/23/23 2056 05/24/23 0802 05/24/23 1145  GLUCAP 134* 189* 151* 162* 219*    Discharge time spent: greater than 30 minutes.  Signed: Jonah Blue, MD [facility] 05/24/2023

## 2023-05-25 ENCOUNTER — Telehealth: Payer: Self-pay

## 2023-05-25 LAB — AEROBIC/ANAEROBIC CULTURE W GRAM STAIN (SURGICAL/DEEP WOUND)

## 2023-05-25 NOTE — Transitions of Care (Post Inpatient/ED Visit) (Signed)
   05/25/2023  Name: Duane [name] MRN: [id number] DOB: 1960-12-10  Today's TOC FU Call Status: Today's TOC FU Call Status:: Successful TOC FU Call Competed TOC FU Call Complete Date: 05/25/23  Transition Care Management Follow-up Telephone Call Date of Discharge: 05/24/23 Discharge Facility: [facility]) Type of Discharge: Inpatient Admission Primary Inpatient Discharge Diagnosis:: intra-abdominal abscess How have you been since you were released from the hospital?: Better Any questions or concerns?: No  Items Reviewed: Did you receive and understand the discharge instructions provided?: Yes Medications obtained,verified, and reconciled?: Partial Review Completed Reason for Partial Mediation Review: He said he has all medications and did not have any questions about the med regime and did not need to review the list Any new allergies since your discharge?: No Dietary orders reviewed?: Yes Type of Diet Ordered:: heart healthy, low sodium Do you have support at home?: Yes People in Home: friend(s)  Medications Reviewed Today: Medications Reviewed Today   Medications were not reviewed in this encounter     Home Care and Equipment/Supplies: Were Home Health Services Ordered?: No Any new equipment or medical supplies ordered?: Yes Name of Medical supply agency?: He received supplies for drain care/ flushing from the hospital staff at discharge. Were you able to get the equipment/medical supplies?: Yes Do you have any questions related to the use of the equipment/supplies?: No (He said his friend has been doing the drain care.  he has 2 drains and she has been measuring output and performing the flushes.)  Functional Questionnaire: Do you need assistance with bathing/showering or dressing?: No Do you need assistance with meal preparation?: No Do you need assistance with eating?: No Do you have difficulty maintaining continence: No Do you need assistance with getting out of  bed/getting out of a chair/moving?: No Do you have difficulty managing or taking your medications?: No  Follow up appointments reviewed: PCP Follow-up appointment confirmed?: Yes Date of PCP follow-up appointment?: 06/13/23 Follow-up Provider: Gwinda Passe, NP Specialist Hospital Follow-up appointment confirmed?: Yes Date of Specialist follow-up appointment?: 06/07/23 Follow-Up Specialty Provider:: GI/colonoscopy.  He has the phone number for IR and said he needs to call and schedule an appointment. He also stated he will call surgery after the colonoscopy is completed. Do you need transportation to your follow-up appointment?: No Do you understand care options if your condition(s) worsen?: Yes-patient verbalized understanding    SIGNATURE   Robyne Peers, RN

## 2023-05-26 ENCOUNTER — Other Ambulatory Visit: Payer: Self-pay | Admitting: General Surgery

## 2023-05-26 ENCOUNTER — Other Ambulatory Visit: Payer: Commercial Managed Care - HMO

## 2023-05-26 DIAGNOSIS — K651 Peritoneal abscess: Secondary | ICD-10-CM

## 2023-06-03 ENCOUNTER — Ambulatory Visit
Admission: RE | Admit: 2023-06-03 | Discharge: 2023-06-03 | Disposition: A | Discharge status: Home / Self Care | Payer: Commercial Managed Care - HMO | Source: Ambulatory Visit | Attending: General Surgery | Admitting: General Surgery

## 2023-06-03 ENCOUNTER — Other Ambulatory Visit: Payer: Commercial Managed Care - HMO

## 2023-06-03 ENCOUNTER — Ambulatory Visit
Admission: RE | Admit: 2023-06-03 | Discharge: 2023-06-03 | Disposition: A | Discharge status: Home / Self Care | Payer: Commercial Managed Care - HMO | Source: Ambulatory Visit | Attending: Student | Admitting: Student

## 2023-06-03 DIAGNOSIS — K651 Peritoneal abscess: Secondary | ICD-10-CM

## 2023-06-03 HISTORY — PX: IR RADIOLOGIST EVAL & MGMT: IMG5224

## 2023-06-03 MED ORDER — IOPAMIDOL (ISOVUE-300) INJECTION 61%
100.0000 mL | Freq: Once | INTRAVENOUS | Status: AC | PRN
  Administered 2023-06-03: 100 mL via INTRAVENOUS

## 2023-06-03 NOTE — Progress Notes (Signed)
Chief Complaint: Intra-abdominal abscesses  Referring Physician(s): Matthews,Kacie Sue-Ellen  History of Present Illness: Duane [name] is a 62 y.o. male with history of HTN and diabetes who presented on 05/21/2023 with abdominal pain.  CT of the abdomen showed thickening of the wall of the sigmoid colon suspicious for primary neoplasm with multiple fluid collections consistent with abscesses.  He underwent midline anterior pelvic and left transgluteal posterior pelvic abscess drain placement with CT guidance on 05/22/2023.  Returns to IR clinic for drain follow-up.    He states that his pain has improved.  He denies any fever or chills. He has had minimal output from the posterior pelvic drain.  He reports approximately 25 mL of daily output of cloudy white fluid from the anterior pelvic drain.   Past Medical History:  Diagnosis Date   Diabetes mellitus without complication (HCC)    Hypertension     Past Surgical History:  Procedure Laterality Date   IR RADIOLOGIST EVAL & MGMT  06/03/2023    Allergies: Codeine  Medications: Prior to Admission medications   Medication Sig Start Date End Date Taking? Authorizing Provider  amLODipine (NORVASC) 10 MG tablet Take 1 tablet (10 mg total) by mouth daily. 09/07/22   [name], NP  metFORMIN (GLUCOPHAGE) 500 MG tablet Take 1 tablet (500 mg total) by mouth 2 (two) times daily with a meal. 09/07/22   [name], NP  Na Sulfate-K Sulfate-Mg Sulf 17.5-3.13-1.6 GM/177ML SOLN Take 354 mLs by mouth as directed. Patient not taking: Reported on 05/21/2023 05/18/23   [provider]  pravastatin (PRAVACHOL) 20 MG tablet Take 1 tablet by mouth once daily 04/05/23   [name], NP  sodium chloride flush 0.9 % SOLN injection Flush abscess drains with 5 ml daily.  Syringes are one time use. 05/24/23 06/23/23  Mickie Kay, NP     Family History  Problem Relation Age of Onset   Colon cancer Neg Hx    Colon  polyps Neg Hx    Rectal cancer Neg Hx    Stomach cancer Neg Hx    Esophageal cancer Neg Hx     Social History   Socioeconomic History   Marital status: Single    Spouse name: Not on file   Number of children: Not on file   Years of education: Not on file   Highest education level: Not on file  Occupational History   Not on file  Tobacco Use   Smoking status: Every Day    Types: Cigarettes   Smokeless tobacco: Never  Vaping Use   Vaping status: Never Used  Substance and Sexual Activity   Alcohol use: Not Currently    Comment: 3x/week   Drug use: Never   Sexual activity: Not on file  Other Topics Concern   Not on file  Social History Narrative   Not on file   Social Determinants of Health   Financial Resource Strain: Not on file  Food Insecurity: Not on file  Transportation Needs: Not on file  Physical Activity: Not on file  Stress: Not on file  Social Connections: Not on file   Review of Systems: A 12 point ROS discussed and pertinent positives are indicated in the HPI above.  All other systems are negative.  Review of Systems  Vital Signs: There were no vitals taken for this visit.  Physical Exam Constitutional:      Appearance: Normal appearance.  Abdominal:     General: Abdomen is  flat. There is no distension.     Palpations: Abdomen is soft.    Skin:    Coloration: Skin is not jaundiced.  Neurological:     Mental Status: He is alert and oriented to person, place, and time.  Psychiatric:        Mood and Affect: Mood normal.        Behavior: Behavior normal.     Imaging: IR Radiologist Eval & Mgmt  Result Date: 06/03/2023 EXAM: FOLLOW-UP PATIENT OFFICE VISIT CHIEF COMPLAINT: Abdominal abscesses HISTORY OF PRESENT ILLNESS: Past Medical History: Please see Epic note Medications: Please see Epic note Allergies: Please see Epic note Social History: Please see Epic note Family History:  Please see Epic note REVIEW OF SYSTEMS: Please see Epic note  PHYSICAL EXAMINATION: Please see Epic note ASSESSMENT AND PLAN: Please see Epic note Electronically Signed   By: Acquanetta Belling M.D.   On: 06/03/2023 14:45   CT GUIDED VISCERAL FLUID DRAIN BY PERC CATH  Result Date: 05/22/2023 INDICATION: 62 year old male with multiple intra-abdominal abscesses and small-bowel ileus. 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 He presents for CT-guided abscess drain placement. EXAM: CT-guided drain placement CT-guided drain placement TECHNIQUE: Multidetector CT imaging of the abdomen and pelvis was performed following the standard protocol without IV contrast. RADIATION DOSE REDUCTION: This exam was performed according to the departmental dose-optimization program which includes automated exposure control, adjustment of the mA and/or kV according to patient size and/or use of iterative reconstruction technique. MEDICATIONS: The patient is currently admitted to the hospital and receiving intravenous antibiotics. The antibiotics were administered within an appropriate time frame prior to the initiation of the procedure. ANESTHESIA/SEDATION: Moderate (conscious) sedation was employed during this procedure. A total of Versed 2 mg and Fentanyl 100 mcg was administered intravenously by the radiology nurse. Total intra-service moderate Sedation Time: 28 minutes. The patient's level of consciousness and vital signs were monitored continuously by radiology nursing throughout the procedure under my direct supervision. COMPLICATIONS: None immediate. PROCEDURE: Informed written consent was obtained from the patient after a thorough discussion of the procedural risks, benefits and alternatives. All questions were addressed. Maximal Sterile Barrier Technique was utilized including caps, mask, sterile gowns, sterile gloves, sterile drape, hand hygiene and skin antiseptic. A timeout was performed prior to the initiation of the procedure. Initial CT imaging was performed. The complex  fluid collection in the low anterior abdomen was identified. The skin was sterilely prepped and draped in the standard fashion using chlorhexidine skin prep. Local anesthesia was attained by infiltration with 1% lidocaine. A small dermatotomy was made. Under intermittent CT guidance, an 18 gauge trocar needle was advanced into the fluid collection. A 0.035 wire was then coiled in the fluid collection. The needle was removed. The percutaneous tract was dilated to 12 [place]. A 12 French all-purpose drainage catheter was then advanced over the wire and formed. Aspiration yields 100 mL foul-smelling purulent fluid. A sample was sent for Gram stain and culture. The catheter was flushed with saline, connected to JP bulb suction and secured to the skin with 0 Prolene suture. Follow-up CT imaging demonstrates a well-positioned drainage catheter with significant resolution of the low anterior abdominal fluid collection. However, the separate pelvic cul-de-sac collection remains unchanged and undrained. Therefore, the patient was repositioned into the left lateral decubitus position. Repeat CT imaging was performed. The complex fluid collection in the pelvic cul-de-sac was identified. The skin was sterilely prepped and draped in standard fashion using chlorhexidine skin  prep. Local anesthesia was attained by infiltration with 1% lidocaine. A small dermatotomy was made. Under intermittent CT guidance, an 18 gauge trocar needle was advanced into the fluid collection. A 0.035 wire was then coiled in the fluid collection. The needle was removed. The percutaneous tract was dilated to 12 [place]. A 12 French all-purpose drainage catheter was then advanced over the wire and formed. Aspiration yields 100 mL foul-smelling purulent fluid. A sample was sent for Gram stain and culture. The catheter was flushed with saline, connected to JP bulb suction and secured to the skin with 0 Prolene suture. Final CT imaging confirms resolution of  fluid collections with well-positioned drainage catheter x2. IMPRESSION: 1. Placement of a 12 French drainage catheter into the low anterior abdominal fluid collection with aspiration of 100 mL foul-smelling purulent fluid which was sent for Gram stain and culture. 2. Placement of a 12 French drainage catheter via a left transgluteal approach into the pelvic cul-de-sac collection with aspiration of an additional 100 mL foul-smelling purulent fluid. Electronically Signed   By: Malachy Moan M.D.   On: 05/22/2023 15:40   CT GUIDED PERITONEAL/RETROPERITONEAL FLUID DRAIN BY PERC CATH  Result Date: 05/22/2023 INDICATION: 62 year old male with multiple intra-abdominal abscesses and small-bowel ileus. 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 He presents for CT-guided abscess drain placement. EXAM: CT-guided drain placement CT-guided drain placement TECHNIQUE: Multidetector CT imaging of the abdomen and pelvis was performed following the standard protocol without IV contrast. RADIATION DOSE REDUCTION: This exam was performed according to the departmental dose-optimization program which includes automated exposure control, adjustment of the mA and/or kV according to patient size and/or use of iterative reconstruction technique. MEDICATIONS: The patient is currently admitted to the hospital and receiving intravenous antibiotics. The antibiotics were administered within an appropriate time frame prior to the initiation of the procedure. ANESTHESIA/SEDATION: Moderate (conscious) sedation was employed during this procedure. A total of Versed 2 mg and Fentanyl 100 mcg was administered intravenously by the radiology nurse. Total intra-service moderate Sedation Time: 28 minutes. The patient's level of consciousness and vital signs were monitored continuously by radiology nursing throughout the procedure under my direct supervision. COMPLICATIONS: None immediate. PROCEDURE: Informed written consent was  obtained from the patient after a thorough discussion of the procedural risks, benefits and alternatives. All questions were addressed. Maximal Sterile Barrier Technique was utilized including caps, mask, sterile gowns, sterile gloves, sterile drape, hand hygiene and skin antiseptic. A timeout was performed prior to the initiation of the procedure. Initial CT imaging was performed. The complex fluid collection in the low anterior abdomen was identified. The skin was sterilely prepped and draped in the standard fashion using chlorhexidine skin prep. Local anesthesia was attained by infiltration with 1% lidocaine. A small dermatotomy was made. Under intermittent CT guidance, an 18 gauge trocar needle was advanced into the fluid collection. A 0.035 wire was then coiled in the fluid collection. The needle was removed. The percutaneous tract was dilated to 12 [place]. A 12 French all-purpose drainage catheter was then advanced over the wire and formed. Aspiration yields 100 mL foul-smelling purulent fluid. A sample was sent for Gram stain and culture. The catheter was flushed with saline, connected to JP bulb suction and secured to the skin with 0 Prolene suture. Follow-up CT imaging demonstrates a well-positioned drainage catheter with significant resolution of the low anterior abdominal fluid collection. However, the separate pelvic cul-de-sac collection remains unchanged and undrained. Therefore, the patient was repositioned into the left  lateral decubitus position. Repeat CT imaging was performed. The complex fluid collection in the pelvic cul-de-sac was identified. The skin was sterilely prepped and draped in standard fashion using chlorhexidine skin prep. Local anesthesia was attained by infiltration with 1% lidocaine. A small dermatotomy was made. Under intermittent CT guidance, an 18 gauge trocar needle was advanced into the fluid collection. A 0.035 wire was then coiled in the fluid collection. The needle was  removed. The percutaneous tract was dilated to 12 [place]. A 12 French all-purpose drainage catheter was then advanced over the wire and formed. Aspiration yields 100 mL foul-smelling purulent fluid. A sample was sent for Gram stain and culture. The catheter was flushed with saline, connected to JP bulb suction and secured to the skin with 0 Prolene suture. Final CT imaging confirms resolution of fluid collections with well-positioned drainage catheter x2. IMPRESSION: 1. Placement of a 12 French drainage catheter into the low anterior abdominal fluid collection with aspiration of 100 mL foul-smelling purulent fluid which was sent for Gram stain and culture. 2. Placement of a 12 French drainage catheter via a left transgluteal approach into the pelvic cul-de-sac collection with aspiration of an additional 100 mL foul-smelling purulent fluid. Electronically Signed   By: Malachy Moan M.D.   On: 05/22/2023 15:40   CT ABDOMEN PELVIS W CONTRAST  Result Date: 05/21/2023 CLINICAL DATA:  Generalized abdominal pain. EXAM: CT ABDOMEN AND PELVIS WITH CONTRAST TECHNIQUE: Multidetector CT imaging of the abdomen and pelvis was performed using the standard protocol following bolus administration of intravenous contrast. RADIATION DOSE REDUCTION: This exam was performed according to the departmental dose-optimization program which includes automated exposure control, adjustment of the mA and/or kV according to patient size and/or use of iterative reconstruction technique. CONTRAST:  75mL OMNIPAQUE IOHEXOL 350 MG/ML SOLN COMPARISON:  None Available. FINDINGS: Lower chest: No acute abnormality. Hepatobiliary: No focal liver abnormality is seen. No gallstones, gallbladder wall thickening, or biliary dilatation. Pancreas: Unremarkable. No pancreatic ductal dilatation or surrounding inflammatory changes. Spleen: Normal in size without focal abnormality. Adrenals/Urinary Tract: Adrenal glands are unremarkable. Kidneys are normal,  without renal calculi, focal lesion, or hydronephrosis. Bladder is unremarkable. Stomach/Bowel: Stomach is within normal limits. Appendix appears normal. There is a multi loculated fluid collection in the lower mid abdomen with multiple additional fluid collections which may reflect developing abscesses. For reference, the multiloculated collection along the superior aspect of the bladder measures approximately 7.7 x 8.2 cm (series 3, image 74). A rim enhancing fluid collection in the pelvis measures approximately 9.7 x 4.7 cm (series 7, image 64). There is focal bowel wall thickening of the sigmoid colon, which may represent a colonic mass or focal diverticulitis, and is in close proximity to the multiloculated collection (series 3, images 70-74). There are multiple loops of dilated small bowel with a possible transition to decompressed small bowel in the lower right abdomen. Vascular/Lymphatic: Aortic atherosclerosis. No enlarged abdominal or pelvic lymph nodes. Reproductive: Prostate is unremarkable. Other: A mass in the inferior anterior abdominal wall in the subcutaneous fat and abutting the skin surface measures 2.6 x 1.0 cm (series 6, image 93). No abdominal wall hernia. Musculoskeletal: Degenerative changes are seen in the spine. IMPRESSION: 1. Sigmoid colon bowel wall thickening may reflect a colonic mass or focal diverticulitis. Multiple rim enhancing fluid collections in the abdomen or pelvis likely reflect abscesses and suggest bowel perforation due to the colonic mass/focal diverticulitis. 2. Multiple loops of dilated small bowel with a possible transition to decompressed small bowel  in the lower right abdomen. This may reflect ileus or small-bowel obstruction and is likely related to the multiple abdominal abscesses/intraperitoneal inflammatory process. Aortic Atherosclerosis (ICD10-I70.0). Electronically Signed   By: Romona Curls M.D.   On: 05/21/2023 15:00    Labs:  CBC: Recent Labs     05/16/23 1449 05/21/23 1153 05/22/23 0033 05/24/23 0340  WBC 7.6 8.7 7.3 12.3*  HGB 15.4 15.6 13.7 12.1*  HCT 44.3 43.8 38.3* 34.6*  PLT 250 363 299 307    COAGS: Recent Labs    05/22/23 0033  INR 1.2    BMP: Recent Labs    05/21/23 1153 05/22/23 0033 05/24/23 0340 05/24/23 1519  NA 133* 136 136 136  K 4.2 2.9* 2.8* 3.4*  CL 95* 98 102 98  CO2 24 27 24 26   GLUCOSE 210* 127* 151* 148*  BUN 23 18 10 9   CALCIUM 8.4* 7.9* 7.4* 7.5*  CREATININE 1.09 1.07 0.95 1.00  GFRNONAA >60 >60 >60 >60    LIVER FUNCTION TESTS: Recent Labs    05/16/23 1449 05/21/23 1153 05/22/23 0033 05/24/23 0340  BILITOT 2.3* 2.0* 1.3* 0.7  AST 18 27 15 17   ALT 16 24 19 18   ALKPHOS 65 74 64 60  PROT 7.6 7.4 6.4* 5.9*  ALBUMIN 3.6 2.7* 2.3* 2.0*    TUMOR MARKERS: No results for input(s): "AFPTM", "CEA", "CA199", "CHROMGRNA" in the last 8760 hours.  Assessment and Plan:  62 year old gentleman with history of multiple abdominal abscesses, likely from perforated sigmoid colon mass, treated by CT-guided drain placement (x2) on 05/22/2023 returns to IR clinic for drain evaluation.    He is doing well and denies any pain, fever, or chills.  He has had minimal serosanguineous output from the posterior pelvic drain.  He does report 25 mL of daily output of cloudy white fluid from the anterior pelvic drain.   CT of the abdomen and pelvis demonstrates resolution of both abscesses.  Abscessogram demonstrates small residual cavities and no fistulous communication with bowel from both drain sites.   Given the persistent output from the anterior drain, it was left in place.  It is connected to a bulb.   The posterior pelvic drain was removed given minimal output and no cavity or fistulous communication.   Plan: - Follow up in IR clinic for abscessogram only in 7-10 days  Thank you for this interesting consult.  I greatly enjoyed meeting Duane [name] and look forward to participating in their  care.  A copy of this report was sent to the requesting provider on this date.  Electronically Signed: Al Corpus  06/03/2023, 4:45 PM   I spent a total of    25 Minutes in face to face in clinical consultation, greater than 50% of which was counseling/coordinating care for abdominal abscess.

## 2023-06-07 ENCOUNTER — Ambulatory Visit (AMBULATORY_SURGERY_CENTER): Payer: Commercial Managed Care - HMO | Admitting: Internal Medicine

## 2023-06-07 ENCOUNTER — Other Ambulatory Visit: Payer: Self-pay

## 2023-06-07 ENCOUNTER — Encounter: Payer: Self-pay | Admitting: Internal Medicine

## 2023-06-07 ENCOUNTER — Telehealth: Payer: Self-pay

## 2023-06-07 VITALS — BP 132/77 | HR 57 | Temp 97.7°F | Resp 12 | Ht 74.0 in | Wt 202.0 lb

## 2023-06-07 DIAGNOSIS — D128 Benign neoplasm of rectum: Secondary | ICD-10-CM

## 2023-06-07 DIAGNOSIS — D124 Benign neoplasm of descending colon: Secondary | ICD-10-CM | POA: Diagnosis not present

## 2023-06-07 DIAGNOSIS — K6389 Other specified diseases of intestine: Secondary | ICD-10-CM

## 2023-06-07 DIAGNOSIS — D122 Benign neoplasm of ascending colon: Secondary | ICD-10-CM

## 2023-06-07 DIAGNOSIS — D125 Benign neoplasm of sigmoid colon: Secondary | ICD-10-CM | POA: Diagnosis not present

## 2023-06-07 DIAGNOSIS — Z1211 Encounter for screening for malignant neoplasm of colon: Secondary | ICD-10-CM

## 2023-06-07 DIAGNOSIS — D123 Benign neoplasm of transverse colon: Secondary | ICD-10-CM | POA: Diagnosis not present

## 2023-06-07 MED ORDER — SODIUM CHLORIDE 0.9 % IV SOLN: 500.0000 mL | Freq: Once | INTRAVENOUS | Status: DC

## 2023-06-07 NOTE — Op Note (Addendum)
[facility] Patient Name: Duane [name] Procedure Date: 06/07/2023 1:26 PM MRN: [id number] Endoscopist: Madelyn Brunner Graball , , [id number] Age: 62 Referring MD:  Date of Birth: [birth year] Gender: Male Account #: [id number] Procedure:                Colonoscopy Indications:              Screening for colorectal malignant neoplasm, This                            is the patient's first colonoscopy Medicines:                Monitored Anesthesia Care Procedure:                Pre-Anesthesia Assessment:                           - Prior to the procedure, a History and Physical                            was performed, and patient medications and                            allergies were reviewed. The patient's tolerance of                            previous anesthesia was also reviewed. The risks                            and benefits of the procedure and the sedation                            options and risks were discussed with the patient.                            All questions were answered, and informed consent                            was obtained. Prior Anticoagulants: The patient has                            taken no anticoagulant or antiplatelet agents. ASA                            Grade Assessment: III - A patient with severe                            systemic disease. After reviewing the risks and                            benefits, the patient was deemed in satisfactory                            condition to undergo the procedure.  After obtaining informed consent, the colonoscope                            was passed under direct vision. Throughout the                            procedure, the patient's blood pressure, pulse, and                            oxygen saturations were monitored continuously. The                            Olympus CF-HQ190L [id number]) Colonoscope was                            introduced through the  anus and advanced to the the                            terminal ileum. The colonoscopy was performed                            without difficulty. The patient tolerated the                            procedure well. The quality of the bowel                            preparation was fair. The terminal ileum, ileocecal                            valve, appendiceal orifice, and rectum were                            photographed. Scope In: 1:37:54 PM Scope Out: 2:31:31 PM Scope Withdrawal Time: 0 hours 44 minutes 7 seconds  Total Procedure Duration: 0 hours 53 minutes 37 seconds  Findings:                 The terminal ileum appeared normal.                           Multiple diverticula were found in the sigmoid                            colon, descending colon, transverse colon and                            ascending colon.                           Two sessile polyps were found in the transverse                            colon and ascending colon. The polyps were 4 to 10  mm in size. These polyps were removed with a cold                            snare. Resection and retrieval were complete.                           Two pedunculated polyps were found in the                            descending colon and transverse colon. The polyps                            were 10 to 15 mm in size. These polyps were removed                            with a hot snare. Resection and retrieval were                            complete.                           A 4 mm polyp was found in the sigmoid colon. The                            polyp was sessile. The polyp was removed with a                            cold snare. Resection and retrieval were complete.                           A frond-like/villous, fungating and polypoid                            partially obstructing large mass was found in the                            sigmoid colon. The mass was partially                             circumferential. The mass measured four cm in                            length. Oozing was present. Biopsies were taken                            with a cold forceps for histology. Area distal to                            the mass was tattooed with an injection of Spot                            (carbon black).  A 20 mm polyp was found in the rectum. The polyp                            was pedunculated. The polyp was removed with a hot                            snare. Resection and retrieval were complete.                           A 5 mm polyp was found in the rectum. The polyp was                            sessile. The polyp was removed with a cold snare.                            Resection and retrieval were complete.                           Non-bleeding internal hemorrhoids were found during                            retroflexion. Complications:            No immediate complications. Estimated Blood Loss:     Estimated blood loss was minimal. Impression:               - Preparation of the colon was fair.                           - The examined portion of the ileum was normal.                           - Diverticulosis in the sigmoid colon, in the                            descending colon, in the transverse colon and in                            the ascending colon.                           - Two 4 to 10 mm polyps in the transverse colon and                            in the ascending colon, removed with a cold snare.                            Resected and retrieved.                           - Two 10 to 15 mm polyps in the descending colon                            and in the  transverse colon, removed with a hot                            snare. Resected and retrieved.                           - One 4 mm polyp in the sigmoid colon, removed with                            a cold snare. Resected and retrieved.                            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 Biopsied. Tattooed.                           - One 20 mm polyp in the rectum, removed with a hot                            snare. Resected and retrieved.                           - One 5 mm polyp in the rectum, removed with a cold                            snare. Resected and retrieved.                           - Non-bleeding internal hemorrhoids. Recommendation:           - Discharge patient to home (with escort).                           - Await pathology results. Asked for results to be                            rushed.                           - Will plan for a chest CT w/contrast to complete                            staging.                           - The findings and recommendations were discussed                            with the patient. Dr Particia Lather "Alan Ripper" Leonides Schanz,  06/07/2023 2:41:10 PM

## 2023-06-07 NOTE — Progress Notes (Signed)
Pt's states no medical or surgical changes since previsit or office visit. 

## 2023-06-07 NOTE — Telephone Encounter (Signed)
-----   Message from Imogene Burn sent at 06/07/2023  2:41 PM EDT ----- Elio Forget, let's get a chest CT w/contrast for cancer staging (due to colon mass seen on colonoscopy). Thanks.

## 2023-06-07 NOTE — Progress Notes (Signed)
Called to room to assist during endoscopic procedure.  Patient ID and intended procedure confirmed with present staff. Received instructions for my participation in the procedure from the performing physician.  

## 2023-06-07 NOTE — Telephone Encounter (Signed)
Order placed for chest CT with contrast asap. Radiology scheduling notified.

## 2023-06-07 NOTE — Patient Instructions (Addendum)
    Await pathology results on polyps removed and pathology results on biopsies done   Handout on polyps & hemorrhoids   given to you today  A CT scan will be set up - Dr Derek Mound office will call you to set this up    YOU HAD AN ENDOSCOPIC PROCEDURE TODAY AT THE [facility]:   Refer to the procedure report that was given to you for any specific questions about what was found during the examination.  If the procedure report does not answer your questions, please call your gastroenterologist to clarify.  If you requested that your care partner not be given the details of your procedure findings, then the procedure report has been included in a sealed envelope for you to review at your convenience later.  YOU SHOULD EXPECT: Some feelings of bloating in the abdomen. Passage of more gas than usual.  Walking can help get rid of the air that was put into your GI tract during the procedure and reduce the bloating. If you had a lower endoscopy (such as a colonoscopy or flexible sigmoidoscopy) you may notice spotting of blood in your stool or on the toilet paper. If you underwent a bowel prep for your procedure, you may not have a normal bowel movement for a few days.  Please Note:  You might notice some irritation and congestion in your nose or some drainage.  This is from the oxygen used during your procedure.  There is no need for concern and it should clear up in a day or so.  SYMPTOMS TO REPORT IMMEDIATELY:  Following lower endoscopy (colonoscopy or flexible sigmoidoscopy):  Excessive amounts of blood in the stool  Significant tenderness or worsening of abdominal pains  Swelling of the abdomen that is new, acute  Fever of 100F or higher    For urgent or emergent issues, a gastroenterologist can be reached at any hour by calling [phone]. Do not use MyChart messaging for urgent concerns.    DIET:  We do recommend a small meal at first, but then you may proceed to your  regular diet.  Drink plenty of fluids but you should avoid alcoholic beverages for 24 hours.  ACTIVITY:  You should plan to take it easy for the rest of today and you should NOT DRIVE or use heavy machinery until tomorrow (because of the sedation medicines used during the test).    FOLLOW UP: Our staff will call the number listed on your records the next business day following your procedure.  We will call around 7:15- 8:00 am to check on you and address any questions or concerns that you may have regarding the information given to you following your procedure. If we do not reach you, we will leave a message.     If any biopsies were taken you will be contacted by phone or by letter within the next 1-3 weeks.  Please call us at [phone] if you have not heard about the biopsies in 3 weeks.    SIGNATURES/CONFIDENTIALITY: You and/or your care partner have signed paperwork which will be entered into your electronic medical record.  These signatures attest to the fact that that the information above on your After Visit Summary has been reviewed and is understood.  Full responsibility of the confidentiality of this discharge information lies with you and/or your care-partner.

## 2023-06-07 NOTE — Progress Notes (Signed)
GASTROENTEROLOGY PROCEDURE H&P NOTE   Primary Care Physician: [name], NP    Reason for Procedure:   Colon cancer screening  Plan:    Colonoscopy  Patient is appropriate for endoscopic procedure(s) in the ambulatory ([facility]) setting.  The nature of the procedure, as well as the risks, benefits, and alternatives were carefully and thoroughly reviewed with the patient. Ample time for discussion and questions allowed. The patient understood, was satisfied, and agreed to proceed.     HPI: Duane [name] is a 62 y.o. male who presents for colonoscopy for colon cancer screening. Denies blood in stools, changes in bowel habits, or unintentional weight loss. Denies family history of colon cancer. His recent CT scan shows signs of a sigmoid colon mass.  Patient had a recent hospitalization 7/20 to 05/24/2023 for abdominal pain with imaging that showed concern for diverticulitis versus sigmoid colon mass along with several fluid-filled collections concerning for abscesses due to possible bowel perforation at some point.  IR placed a drain into fluid collections and he was treated with antibiotic therapy.  I did discuss the increased risk of perforation due to recent possible diverticulitis with the patient, and he is agreeable to proceeding.  Due to the finding of a sigmoid colon mass, I do think it is appropriate to proceed to evaluate for any underlying malignancy that could be contributing to his symptoms.  Past Medical History:  Diagnosis Date   Diabetes mellitus without complication (HCC)    Hypertension     Past Surgical History:  Procedure Laterality Date   IR RADIOLOGIST EVAL & MGMT  06/03/2023    Prior to Admission medications   Medication Sig Start Date End Date Taking? Authorizing Provider  amLODipine (NORVASC) 10 MG tablet Take 1 tablet (10 mg total) by mouth daily. 09/07/22  Yes [name], NP  metFORMIN (GLUCOPHAGE) 500 MG tablet Take 1 tablet (500 mg total)  by mouth 2 (two) times daily with a meal. 09/07/22  Yes [name], NP  pravastatin (PRAVACHOL) 20 MG tablet Take 1 tablet by mouth once daily 04/05/23  Yes Gwinda Passe P, NP  sodium chloride flush 0.9 % SOLN injection Flush abscess drains with 5 ml daily.  Syringes are one time use. 05/24/23 06/23/23 Yes Covington, Arman Filter, NP    Current Outpatient Medications  Medication Sig Dispense Refill   amLODipine (NORVASC) 10 MG tablet Take 1 tablet (10 mg total) by mouth daily. 90 tablet 1   metFORMIN (GLUCOPHAGE) 500 MG tablet Take 1 tablet (500 mg total) by mouth 2 (two) times daily with a meal. 180 tablet 1   pravastatin (PRAVACHOL) 20 MG tablet Take 1 tablet by mouth once daily 90 tablet 0   sodium chloride flush 0.9 % SOLN injection Flush abscess drains with 5 ml daily.  Syringes are one time use. 300 mL 0   Current Facility-Administered Medications  Medication Dose Route Frequency Provider Last Rate Last Admin   0.9 %  sodium chloride infusion  500 mL Intravenous Once Imogene Burn, MD        Allergies as of 06/07/2023 - Review Complete 06/07/2023  Allergen Reaction Noted   Codeine Other (See Comments) 03/04/2014    Family History  Problem Relation Age of Onset   Colon cancer Neg Hx    Colon polyps Neg Hx    Rectal cancer Neg Hx    Stomach cancer Neg Hx    Esophageal cancer Neg Hx     Social History  Socioeconomic History   Marital status: Single    Spouse name: Not on file   Number of children: Not on file   Years of education: Not on file   Highest education level: Not on file  Occupational History   Not on file  Tobacco Use   Smoking status: Every Day    Types: Cigarettes   Smokeless tobacco: Never  Vaping Use   Vaping status: Never Used  Substance and Sexual Activity   Alcohol use: Not Currently    Comment: 3x/week   Drug use: Never   Sexual activity: Not on file  Other Topics Concern   Not on file  Social History Narrative   Not on file    Social Determinants of Health   Financial Resource Strain: Not on file  Food Insecurity: Not on file  Transportation Needs: Not on file  Physical Activity: Not on file  Stress: Not on file  Social Connections: Not on file  Intimate Partner Violence: Not on file    Physical Exam: Vital signs in last 24 hours: BP 129/75   Pulse 72   Temp 97.7 F (36.5 C) (Temporal)   Resp 16   Ht 6\' 2"  (1.88 m)   Wt 202 lb (91.6 kg)   SpO2 100%   BMI 25.94 kg/m  GEN: NAD EYE: Sclerae anicteric ENT: MMM CV: Non-tachycardic Pulm: No increased work of breathing GI: Soft, NT/ND NEURO:  Alert & Oriented   Eulah Pont, MD [facility]  06/07/2023 1:34 PM

## 2023-06-07 NOTE — Progress Notes (Signed)
Vss nad trans to pacu 

## 2023-06-08 ENCOUNTER — Telehealth: Payer: Self-pay | Admitting: Internal Medicine

## 2023-06-08 ENCOUNTER — Telehealth: Payer: Self-pay | Admitting: *Deleted

## 2023-06-08 NOTE — Telephone Encounter (Signed)
  Follow up Call-     06/07/2023    1:14 PM  Call back number  Post procedure Call Back phone  # [phone]  Permission to leave phone message Yes     Patient questions:   Message left to call us if necessary.

## 2023-06-08 NOTE — Telephone Encounter (Signed)
Spoke to the patient about the results of his colon biopsies.  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  There were no definitive signs of cancer, but I do still have very high concern that the sigmoid colon mass is likely a malignancy.  It is possible that I was not able to biopsy deep enough to be able to get a definitive diagnosis of malignancy.   I told the patient that I think the he needs to get surgical resection of the sigmoid colon mass/polyp because I do not think that the sigmoid colon mass/polyp is endoscopically resectable.  The mass/polyp is also causing a partial obstruction in his colon.  If the patient is able to get surgical resection of the mass/polyp, then this will also definitively determine whether or not there is any underlying malignancy present.  Patient has an appointment with Dr. Derrell Lolling with surgery in 2 days.  I will CC Dr. Derrell Lolling to this telephone note.  If the patient is not a candidate for surgical resection, then we will need to discuss alternative plans to get a definitive diagnosis.  Patient's other polyps in his colon were benign and precancerous.

## 2023-06-10 ENCOUNTER — Telehealth (INDEPENDENT_AMBULATORY_CARE_PROVIDER_SITE_OTHER): Payer: Self-pay | Admitting: Primary Care

## 2023-06-10 NOTE — Telephone Encounter (Signed)
Left voicemail for patient to call back. 

## 2023-06-10 NOTE — Telephone Encounter (Addendum)
===  View-only below this line=== ----- Message ----- From: Rennie Natter, NT Sent: 06/07/2023   4:56 PM EDT To: Evalee Jefferson, LPN; Rennie Natter, NT Subject: RE: asap chest CT                              FYI...  06/07/23-LVM.Marland KitchenMarland Kitchen A detailed message was left on pts VM

## 2023-06-13 ENCOUNTER — Ambulatory Visit (INDEPENDENT_AMBULATORY_CARE_PROVIDER_SITE_OTHER): Payer: Commercial Managed Care - HMO | Admitting: Primary Care

## 2023-06-13 ENCOUNTER — Encounter (INDEPENDENT_AMBULATORY_CARE_PROVIDER_SITE_OTHER): Payer: Self-pay | Admitting: Primary Care

## 2023-06-13 VITALS — BP 129/79 | HR 74 | Resp 16 | Ht 74.0 in | Wt 201.6 lb

## 2023-06-13 DIAGNOSIS — E119 Type 2 diabetes mellitus without complications: Secondary | ICD-10-CM

## 2023-06-13 DIAGNOSIS — Z7984 Long term (current) use of oral hypoglycemic drugs: Secondary | ICD-10-CM

## 2023-06-13 DIAGNOSIS — I10 Essential (primary) hypertension: Secondary | ICD-10-CM | POA: Diagnosis not present

## 2023-06-13 DIAGNOSIS — E876 Hypokalemia: Secondary | ICD-10-CM

## 2023-06-13 DIAGNOSIS — E785 Hyperlipidemia, unspecified: Secondary | ICD-10-CM | POA: Diagnosis not present

## 2023-06-13 DIAGNOSIS — Z23 Encounter for immunization: Secondary | ICD-10-CM

## 2023-06-13 DIAGNOSIS — Z760 Encounter for issue of repeat prescription: Secondary | ICD-10-CM

## 2023-06-13 MED ORDER — AMLODIPINE BESYLATE 10 MG PO TABS
10.0000 mg | ORAL_TABLET | Freq: Every day | ORAL | 1 refills | Status: DC
Start: 2023-06-13 — End: 2024-01-02

## 2023-06-13 MED ORDER — METFORMIN HCL 500 MG PO TABS
500.0000 mg | ORAL_TABLET | Freq: Two times a day (BID) | ORAL | 1 refills | Status: DC
Start: 2023-06-13 — End: 2023-10-05

## 2023-06-13 NOTE — Progress Notes (Unsigned)
[facility]  Duane [name], is a 62 y.o. male  OZH:[id number]  GEX:[id number]  DOB - Jul 30, 1961  Chief Complaint  Patient presents with   Hospitalization Follow-up       Subjective:   [name] is a 62 y.o. male here today for a follow up for manage HTN and T2D. He was recently hospitalized presented for abdominal pain and  CT abdomen pelvis showed colonic wall thickening concerning for mass versus diverticulitis. Multiple fluid collections suspicious for abscesses and raising suspicion for possible bowel perforation at some point. Also noted to be evidence of Small Bowel Obstruction versus ileus. General surgery and are following after surgery. . Patient has No headache, No chest pain, No abdominal pain - No Nausea, No new weakness tingling or numbness, No Cough - shortness of breath  No problems updated.  Allergies  Allergen Reactions   Codeine Other (See Comments)    "I get jittery."     Past Medical History:  Diagnosis Date   Diabetes mellitus without complication (HCC)    Hypertension     Current Outpatient Medications on File Prior to Visit  Medication Sig Dispense Refill   pravastatin (PRAVACHOL) 20 MG tablet Take 1 tablet by mouth once daily 90 tablet 0   sodium chloride flush 0.9 % SOLN injection Flush abscess drains with 5 ml daily.  Syringes are one time use. 300 mL 0   No current facility-administered medications on file prior to visit.    Objective:   Vitals:   06/13/23 1352  BP: 129/79  Pulse: 74  Resp: 16  SpO2: 99%  Weight: 201 lb 9.6 oz (91.4 kg)  Height: 6\' 2"  (1.88 m)    Comprehensive ROS Pertinent positive and negative noted in HPI   Exam General appearance : Awake, alert, not in any distress. Speech Clear. Not toxic looking HEENT: Atraumatic and Normocephalic, pupils equally reactive to light and accomodation Neck: Supple, no JVD. No cervical lymphadenopathy.  Chest: Good air entry bilaterally, no added sounds   CVS: S1 S2 regular, no murmurs.  Abdomen: Bowel sounds present, Non tender and not distended with no gaurding, rigidity or rebound. Extremities: B/L Lower Ext shows no edema, both legs are warm to touch Neurology: Awake alert, and oriented X 3,  Non focal Skin: No Rash  Data Review Lab Results  Component Value Date   HGBA1C 6.4 (H) 12/23/2022   HGBA1C 6.3 09/07/2022   HGBA1C 7.0 (A) 04/14/2022    Assessment & Plan  [place] was seen today for hospitalization follow-up.  Diagnoses and all orders for this visit:  Essential hypertension BP goal - <140/90  Explained that having normal blood pressure is the goal and medications are helping to get to goal and maintain normal blood pressure. DIET: Limit salt intake, read nutrition labels to check salt content, limit fried and high fatty foods  Avoid using multisymptom OTC cold preparations that generally contain sudafed which can rise BP. Consult with pharmacist on best cold relief products to use for persons with HTN EXERCISE Discussed incorporating exercise such as walking - 30 minutes most days of the week and can do in 10 minute intervals    -     amLODipine (NORVASC) 10 MG tablet; Take 1 tablet (10 mg total) by mouth daily.  Type 2 diabetes mellitus without complication, without long-term current use of insulin (HCC) -     Ambulatory referral to Ophthalmology -     metFORMIN (GLUCOPHAGE) 500 MG tablet; Take 1 tablet (500  mg total) by mouth 2 (two) times daily with a meal.  Comprehensive diabetic foot examination, type 2 DM, encounter for [facility]) Completed   Need for shingles vaccine  Dyslipidemia  Healthy lifestyle diet of fruits vegetables fish nuts whole grains and low saturated fat . Foods high in cholesterol or liver, fatty meats,cheese, butter avocados, nuts and seeds, chocolate and fried foods. -     Lipid Panel  Hypokalemia -     CMP14+EGFR  Medication refill -     amLODipine (NORVASC) 10 MG tablet; Take 1 tablet (10  mg total) by mouth daily.    Patient have been counseled extensively about nutrition and exercise. Other issues discussed during this visit include: low cholesterol diet, weight control and daily exercise, foot care, annual eye examinations at Ophthalmology, importance of adherence with medications and regular follow-up. We also discussed long term complications of uncontrolled diabetes and hypertension.   As needed  The patient was given clear instructions to go to ER or return to medical center if symptoms don't improve, worsen or new problems develop. The patient verbalized understanding. The patient was told to call to get lab results if they haven't heard anything in the next week.   This note has been created with Education officer, environmental. Any transcriptional errors are unintentional.   [name], NP 06/15/2023, 8:37 PM

## 2023-06-16 ENCOUNTER — Ambulatory Visit (INDEPENDENT_AMBULATORY_CARE_PROVIDER_SITE_OTHER): Payer: Self-pay | Admitting: *Deleted

## 2023-06-16 NOTE — Telephone Encounter (Signed)
Summary: medication question   Pt is calling to see why his PCP prescribed him: amLODipine (NORVASC) 10 MG tablet. Medication was called into the pharmacy on 06/13/2023. Pt is wanting to know what the medication is for. Please advise.

## 2023-06-16 NOTE — Telephone Encounter (Signed)
  Chief Complaint: Medication question Symptoms: NA Frequency: NA Pertinent Negatives: Patient denies NA Disposition: [] ED /[] Urgent Care (no appt availability in office) / [] Appointment(In office/virtual)/ []  [place] [facility]/ [x] Home Care/ [] Refused Recommended Disposition /[] [place] [facility]/ []  Follow-up with PCP Additional Notes: Pt asking what amlodipine is used for. Reviewed with pt, verbalizes understanding. States pharmacist called it "Something else" (Norvasc) and wanted to be sure same med.  Advised to CB if any additional questions arise.  Answer Assessment - Initial Assessment Questions 1. NAME of MEDICINE: "What medicine(s) are you calling about?"     Amlodipine 2. QUESTION: "What is your question?" (e.g., double dose of medicine, side effect)     Use 3. PRESCRIBER: "Who prescribed the medicine?" Reason: if prescribed by specialist, call should be referred to that group.     PCP  Protocols used: Medication Question Call-A-AH

## 2023-06-17 ENCOUNTER — Other Ambulatory Visit (INDEPENDENT_AMBULATORY_CARE_PROVIDER_SITE_OTHER): Payer: Self-pay | Admitting: Primary Care

## 2023-06-20 ENCOUNTER — Encounter: Payer: Self-pay | Admitting: Internal Medicine

## 2023-06-22 ENCOUNTER — Other Ambulatory Visit (HOSPITAL_COMMUNITY): Payer: Self-pay | Admitting: Surgery

## 2023-06-22 DIAGNOSIS — K668 Other specified disorders of peritoneum: Secondary | ICD-10-CM

## 2023-06-22 DIAGNOSIS — C186 Malignant neoplasm of descending colon: Secondary | ICD-10-CM

## 2023-06-24 ENCOUNTER — Other Ambulatory Visit: Payer: Self-pay | Admitting: Surgery

## 2023-06-24 DIAGNOSIS — R1909 Other intra-abdominal and pelvic swelling, mass and lump: Secondary | ICD-10-CM

## 2023-06-27 ENCOUNTER — Encounter: Payer: Self-pay | Admitting: General Practice

## 2023-06-27 NOTE — Progress Notes (Signed)
Berdine Dance, MD  Caroleen Hamman, NT Hold and review when PET done  Thanks TS       Previous Messages    ----- Message ----- From: Caroleen Hamman, NT Sent: 06/24/2023  12:14 PM EDT To: Ir Procedure Requests Subject: CT ABDOMINAL MASS BIOPSY                      Procedure: CT ABDOMINAL MASS BIOPSY  Reason: Nodule of groin  History: CT in chart  Provider: Andria Meuse, MD  Contact: [phone]

## 2023-06-29 ENCOUNTER — Other Ambulatory Visit: Payer: Self-pay

## 2023-06-29 NOTE — Progress Notes (Signed)
The proposed treatment discussed in conference is for discussion purpose only and is not a binding recommendation.  The patients have not been physically examined, or presented with their treatment options.  Therefore, final treatment plans cannot be decided.  

## 2023-06-30 ENCOUNTER — Encounter (HOSPITAL_COMMUNITY)
Admission: RE | Admit: 2023-06-30 | Discharge: 2023-06-30 | Disposition: A | Discharge status: Home / Self Care | Payer: Commercial Managed Care - HMO | Source: Ambulatory Visit | Attending: Surgery | Admitting: Surgery

## 2023-06-30 DIAGNOSIS — K668 Other specified disorders of peritoneum: Secondary | ICD-10-CM | POA: Insufficient documentation

## 2023-06-30 DIAGNOSIS — C186 Malignant neoplasm of descending colon: Secondary | ICD-10-CM | POA: Diagnosis not present

## 2023-06-30 LAB — GLUCOSE, CAPILLARY: Glucose-Capillary: 102 mg/dL — ABNORMAL HIGH (ref 70–99)

## 2023-06-30 MED ORDER — FLUDEOXYGLUCOSE F - 18 (FDG) INJECTION
10.0000 | Freq: Once | INTRAVENOUS | Status: AC | PRN
  Administered 2023-06-30: 10.03 via INTRAVENOUS

## 2023-07-01 NOTE — Telephone Encounter (Signed)
Patient had PET ordered by Dr Cliffton Asters.

## 2023-07-02 ENCOUNTER — Other Ambulatory Visit (INDEPENDENT_AMBULATORY_CARE_PROVIDER_SITE_OTHER): Payer: Self-pay | Admitting: Primary Care

## 2023-07-05 NOTE — Telephone Encounter (Signed)
Requested medication (s) are due for refill today: yes  Requested medication (s) are on the active medication list: yes  Last refill:  06/17/23 #90 0 refills  Future visit scheduled: no   Notes to clinic:  last labs 06/13/23 . No refills remain. Do you want to refill Rx?     Requested Prescriptions  Pending Prescriptions Disp Refills   pravastatin (PRAVACHOL) 20 MG tablet [Pharmacy Med Name: Pravastatin Sodium 20 MG Oral Tablet] 90 tablet 0    Sig: Take 1 tablet by mouth once daily     Cardiovascular:  Antilipid - Statins Failed - 07/02/2023  6:53 AM      Failed - Lipid Panel in normal range within the last 12 months    Cholesterol, Total  Date Value Ref Range Status  06/13/2023 144 100 - 199 mg/dL Final   LDL Chol Calc (NIH)  Date Value Ref Range Status  06/13/2023 76 0 - 99 mg/dL Final   HDL  Date Value Ref Range Status  06/13/2023 50 >39 mg/dL Final   Triglycerides  Date Value Ref Range Status  06/13/2023 95 0 - 149 mg/dL Final         Passed - Patient is not pregnant      Passed - Valid encounter within last 12 months    Recent Outpatient Visits           3 weeks ago Essential hypertension   [place] [facility] [name], NP   6 months ago Essential hypertension   [place] [facility] [name], NP   10 months ago Type 2 diabetes mellitus without complication, without long-term current use of insulin (HCC)   [place] [facility] [name], NP   1 year ago Essential hypertension   [place] [facility] [name], NP   1 year ago Colon cancer screening   [place] [facility] [name], NP

## 2023-07-06 ENCOUNTER — Encounter: Payer: Self-pay | Admitting: General Practice

## 2023-07-06 NOTE — Progress Notes (Unsigned)
Berdine Dance, MD  Caroleen Hamman, NT; P Ir Procedure Requests Sent message to Dr Cliffton Asters that there is nothing to target on the PET and to proceed with surgery.  Cancel this bx  TS       Previous Messages    ----- Message ----- From: Caroleen Hamman, NT Sent: 07/06/2023  12:29 PM EDT To: Ir Procedure Requests Subject: CT ABDOMINAL MASS BIOPSY                      Procedure: CT ABDOMINAL MASS BIOPSY  Reason: Nodule of groin Dx: Nodule of groin IMPRESSION: 1. Hypermetabolic mass in the proximal sigmoid colon consistent with primary colorectal carcinoma. 2. No evidence of metastatic adenopathy or peritoneal metastasis. No liver metastasis or distant metastasis     History: PET and CT in chart  Provider: Andria Meuse, MD  Contact: [phone]

## 2023-07-08 NOTE — Progress Notes (Unsigned)
[facility]   [phone] [phone]   Clinic New Consult Note   Patient Care Team: [name], NP as PCP - General (Internal Medicine)  Date of Service:  07/11/2023   CHIEF COMPLAINTS/PURPOSE OF CONSULTATION:  Colon Mass  REFERRING PHYSICIAN:  White,Christopher Michael,MD  ASSESSMENT & PLAN:  Duane [name] is a 62 y.o.  male with a history of   1.Cancer of Sigmoid colon (HCC), [id number] -Patient presented to hospital with perforated sigmoid colon and a contained abscess, required IR drainage tube placement. -Colonoscopy on June 07, 2023 showed a large 4 cm fungating mass in the sigmoid colon, also biopsy did not show malignant cells, this is clinically consistent with colon cancer. -I reviewed his PET scan from June 30, 2023, which showed hypermetabolic mass in the proximal sigmoid colon, no evidence of metastatic adenopathy, or peritoneal and distant metastasis. -I have reviewed the case with Dr. Cliffton Asters, plan to proceed with surgery first.  He is scheduled for October 14. -I discussed the role of adjuvant chemotherapy.  Due to the perforated colon cancer, he certainly has risk for future recurrence.  If he hasT3/4 or node positive disease, will offer adjuvant chemotherapy. -We also discussed the small possibility of peritoneal metastasis which may not show up on CT and PET scan.  Dr. Cliffton Asters did plan to do a exploratory laparoscope first before colon resection. -I will see him 2 to 3 weeks after surgery, to decide his adjuvant chemo.   PLAN:  -Surgery schedule for 10/14 - Lab  today  -CMP,Tumor marker order -Ferritin order today -F/u 2-3 week after surgery in my office   Oncology History  Cancer of sigmoid colon (HCC)  05/21/2023 Imaging   CT ABDOMEN PELVIS W CONTRAST   IMPRESSION: 1. Sigmoid colon bowel wall thickening may reflect a colonic mass or focal diverticulitis. Multiple rim enhancing fluid collections in the abdomen or  pelvis likely reflect abscesses and suggest bowel perforation due to the colonic mass/focal diverticulitis. 2. Multiple loops of dilated small bowel with a possible transition to decompressed small bowel in the lower right abdomen. This may reflect ileus or small-bowel obstruction and is likely related to the multiple abdominal abscesses/intraperitoneal inflammatory process.   06/03/2023 Imaging   CT ABDOMEN PELVIS W CONTRAST    IMPRESSION: 1. Interval placement of midline anterior and left transgluteal approach pelvic drains. The abscesses have resolved. 2. Masslike thickening of the sigmoid colon remains highly suspicious for primary neoplasm. 3. Soft tissue nodular implant in the right lower quadrant measuring 1.5 cm is suspicious for metastatic implant.     06/08/2023 Pathology Results   Diagnosis 1. Colon, polyp(s), transverse and ascending (3) - TUBULAR ADENOMA, FRAGMENTS. 2. Colon, polyp(s), sigmoid and descending (2) - TUBULOVILLOUS/TUBULAR ADENOMA, FRAGMENTS. 3. 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 NOTE: IT IS NOTED THAT THIS IS SUBMITTED AS A MASS. WHILE THERE IS FOCAL SPLAYING OF SMOOTH MUSCLE DEFINITIVE EVIDENCE OF INVASION INCLUDING DESMOPLASIA OR THE PRESENCE OF SUBMUCOSAL TISSUE IS NOT PRESENT IN THESE SUPERFICIAL BIOPSIES. CLINICAL/ENDOSCOPIC CORRELATION NECESSARY. 4. Rectum, polyp(s), (2) - TUBULOVILLOUS ADENOMA, FRAGMENTS.   06/30/2023 PET scan   NM PET IMAGE initial (PI) SKULL BASE TO THIGH  IMPRESSION: 1. Hypermetabolic mass in the proximal sigmoid colon consistent with primary colorectal carcinoma. 2. No evidence of metastatic adenopathy or peritoneal metastasis. No liver metastasis or distant metastasis   07/10/2023 Initial Diagnosis   Cancer of sigmoid colon [facility])    Imaging  HISTORY OF PRESENTING ILLNESS:  Duane [name] 62 y.o. male is a here because of Cancer of left colon.  The patient was referred by White,Christopher Michael,MD. The patient presents to the clinic today accompanied by  family friend.  Patient presented today emergency room on May 21, 2023 with sudden onset of abdominal pain, nausea, vomiting, constipation with intermittent diarrhea.  CT abdomen pelvis showed colonic wall thickening concerning for mass versus diverticulitis in the sigmoid colon, as well has multiple fluid filled collection concerning for abscess from perforation.  Patient underwent IR drainage tube placement, and antibiotics.  Patient underwent a colonoscopy on June 07, 2023, which showed a large fungating, partially confidential 4 cm mass in the sigmoid colon, biopsy showed superficial fragments of villous adenoma, with high-grade dysplasia.  Patient was seen by colorectal surgeon Dr. Cliffton Asters, CT abdomen pelvis repeated on June 03, 2023, which showed 1.5 cm soft tissue nodule implant in the right lower quadrant, concerning for metastasis.  He underwent PET scan for further evaluation on June 30, 2023, which showed hypermetabolic sigmoid colon mass, no node, peritoneal, or other distant metastasis.  Patient still has the pelvic drainage tube, which is 20 cc liquid output every day.  He is clinically doing well, denies any pain or abdominal distention.  He is eating well, weight has been stable.  He is scheduled for surgery on August 15, 2023.  He has a PMHx of.... Diabetes Mellitus Hypertension Hyperlipidemia   Socially... Single No children Smoker (Stopped a month ago) Unemployed  REVIEW OF SYSTEMS:    Constitutional: (+) fevers, chills or (+)abnormal night sweats Eyes:(-) Denies blurriness of vision, double vision or watery eyes Ears, nose, mouth, throat, and face: (-)Denies mucositis or sore throat Respiratory:(-) Denies cough, (-)dyspnea or (-)wheezes Cardiovascular: (-)Denies palpitation, (-)chest discomfort or (-)Lower extremity swelling Gastrointestinal: (+)Denies  nausea, (-)heartburn or (+)change in bowel habits Skin: (-) Denies abnormal skin rashes Lymphatics: (-)Denies new lymphadenopathy or(-) easy bruising Neurological:(-) Denies numbness, tingling or new weaknesses Behavioral/Psych: (-) Mood is stable, no new changes  All other systems were reviewed with the patient and are negative.   MEDICAL HISTORY:  Past Medical History:  Diagnosis Date   Diabetes mellitus without complication (HCC)    Hypertension     SURGICAL HISTORY: Past Surgical History:  Procedure Laterality Date   IR RADIOLOGIST EVAL & MGMT  06/03/2023    SOCIAL HISTORY: Social History   Socioeconomic History   Marital status: Single    Spouse name: Not on file   Number of children: 0   Years of education: Not on file   Highest education level: Not on file  Occupational History   Not on file  Tobacco Use   Smoking status: Former    Current packs/day: 1.00    Average packs/day: 1 pack/day for 49.7 years (49.7 ttl pk-yrs)    Types: Cigarettes    Start date: 11/01/1973   Smokeless tobacco: Never  Vaping Use   Vaping status: Never Used  Substance and Sexual Activity   Alcohol use: Not Currently    Comment: 3x/week   Drug use: Never   Sexual activity: Not on file  Other Topics Concern   Not on file  Social History Narrative   Not on file   Social Determinants of Health   Financial Resource Strain: Not on file  Food Insecurity: Not on file  Transportation Needs: Not on file  Physical Activity: Not on file  Stress: Not on file  Social Connections: Not on file  Intimate Partner Violence: Not on file    FAMILY HISTORY: Family History  Problem Relation Age of Onset   Colon cancer Neg Hx    Colon polyps Neg Hx    Rectal cancer Neg Hx    Stomach cancer Neg Hx    Esophageal cancer Neg Hx     ALLERGIES:  is allergic to codeine.  MEDICATIONS:  Current Outpatient Medications  Medication Sig Dispense Refill   amLODipine (NORVASC) 10 MG tablet Take 1  tablet (10 mg total) by mouth daily. 90 tablet 1   metFORMIN (GLUCOPHAGE) 500 MG tablet Take 1 tablet (500 mg total) by mouth 2 (two) times daily with a meal. 180 tablet 1   pravastatin (PRAVACHOL) 20 MG tablet Take 1 tablet by mouth once daily 90 tablet 0   No current facility-administered medications for this visit.    PHYSICAL EXAMINATION: ECOG PERFORMANCE STATUS: 0 - Asymptomatic  Vitals:   07/11/23 1507  BP: 133/79  Pulse: 66  Resp: 18  Temp: 98.7 F (37.1 C)  SpO2: 100%   Filed Weights   07/11/23 1507  Weight: 205 lb 14.4 oz (93.4 kg)     GENERAL:alert, no distress and comfortable SKIN: skin color normal, no rashes or significant lesions EYES: normal, Conjunctiva are pink and non-injected, sclera clear  NEURO: alert & oriented x 3 with fluent speech LUNGS: (-) clear to auscultation and percussion with normal breathing effort HEART:(-) regular rate & rhythm and no murmurs and no lower extremity edema ABDOMEN:(-) abdomen soft, (-)non-tender and (-)normal bowel sounds LABORATORY DATA:  I have reviewed the data as listed    Latest Ref Rng & Units 07/11/2023    3:31 PM 05/24/2023    3:40 AM 05/22/2023   12:33 AM  CBC  WBC 4.0 - 10.5 K/uL 6.6  12.3  7.3   Hemoglobin 13.0 - 17.0 g/dL 98.1  19.1  47.8   Hematocrit 39.0 - 52.0 % 36.7  34.6  38.3   Platelets 150 - 400 K/uL 225  307  299        Latest Ref Rng & Units 07/11/2023    3:31 PM 06/13/2023    2:40 PM 05/24/2023    3:19 PM  CMP  Glucose 70 - 99 mg/dL 295  74  621   BUN 8 - 23 mg/dL 7  6  9    Creatinine 0.61 - 1.24 mg/dL 3.08  6.57  8.46   Sodium 135 - 145 mmol/L 140  141  136   Potassium 3.5 - 5.1 mmol/L 3.5  3.9  3.4   Chloride 98 - 111 mmol/L 106  104  98   CO2 22 - 32 mmol/L 28  22  26    Calcium 8.9 - 10.3 mg/dL 9.0  9.0  7.5   Total Protein 6.5 - 8.1 g/dL 7.6  6.8    Total Bilirubin 0.3 - 1.2 mg/dL 0.8  0.8    Alkaline Phos 38 - 126 U/L 59  74    AST 15 - 41 U/L 12  14    ALT 0 - 44 U/L 7  12        RADIOGRAPHIC STUDIES: I have personally reviewed the radiological images as listed and agreed with the findings in the report. NM PET Image Initial (PI) Skull Base To Thigh (F-18 FDG)  Result Date: 07/03/2023 CLINICAL DATA:  Initial treatment strategy for colorectal carcinoma. Peritoneal mass. EXAM: NUCLEAR MEDICINE PET SKULL BASE TO THIGH TECHNIQUE: 10.0 mCi F-18 FDG was injected  intravenously. Full-ring PET imaging was performed from the skull base to thigh after the radiotracer. CT data was obtained and used for attenuation correction and anatomic localization. Fasting blood glucose: 102 mg/dl COMPARISON:  CT 16/08/9603, 06/03/2023 FINDINGS: Mediastinal blood pool activity: SUV max 2.5 Liver activity: SUV max NA NECK: No hypermetabolic lymph nodes in the neck. Incidental CT findings: 1 CHEST: No hypermetabolic mediastinal or hilar nodes. No suspicious pulmonary nodules on the CT scan. Incidental CT findings: None. ABDOMEN/PELVIS: Circumferential mass in the proximal sigmoid colon measures 4.5 by 4.2 cm (image 175) with intense metabolic activity (SUV max equal 12.1). There is a percutaneous drainage catheter adjacent to the mass. There is physiologic activity noted within the small bowel and colon. No hypermetabolic mesenteric nodes or iliac nodes. No hypermetabolic peritoneal disease identified. Small nodule in the RIGHT iliac fossa measuring 6 mm at site of prior abscess. No significant metabolic activity (image 162). Incidental CT findings: None. SKELETON: No focal hypermetabolic activity to suggest skeletal metastasis. Incidental CT findings: None. IMPRESSION: 1. Hypermetabolic mass in the proximal sigmoid colon consistent with primary colorectal carcinoma. 2. No evidence of metastatic adenopathy or peritoneal metastasis. No liver metastasis or distant metastasis Electronically Signed   By: Genevive Bi M.D.   On: 07/03/2023 21:54     Orders Placed This Encounter  Procedures   CBC with  Differential/Platelet    Standing Status:   Standing    Number of Occurrences:   50    Standing Expiration Date:   07/10/2024   CEA (IN HOUSE-[facility])    Standing Status:   Standing    Number of Occurrences:   20    Standing Expiration Date:   07/10/2024   Comprehensive metabolic panel    Standing Status:   Standing    Number of Occurrences:   50    Standing Expiration Date:   07/10/2024   Ferritin    Standing Status:   Standing    Number of Occurrences:   20    Standing Expiration Date:   07/10/2024    All questions were answered. The patient knows to call the clinic with any problems, questions or concerns. The total time spent in the appointment was 45 minutes.     Malachy Mood, MD 07/11/2023   Carolin Coy am acting as scribe for Malachy Mood, MD.   I have reviewed the above documentation for accuracy and completeness, and I agree with the above.

## 2023-07-10 DIAGNOSIS — C187 Malignant neoplasm of sigmoid colon: Secondary | ICD-10-CM | POA: Insufficient documentation

## 2023-07-11 ENCOUNTER — Encounter: Payer: Self-pay | Admitting: Hematology

## 2023-07-11 ENCOUNTER — Inpatient Hospital Stay: Payer: Managed Care, Other (non HMO) | Attending: Hematology | Admitting: Hematology

## 2023-07-11 ENCOUNTER — Inpatient Hospital Stay: Payer: Managed Care, Other (non HMO)

## 2023-07-11 VITALS — BP 133/79 | HR 66 | Temp 98.7°F | Resp 18 | Ht 73.5 in | Wt 205.9 lb

## 2023-07-11 DIAGNOSIS — Z79899 Other long term (current) drug therapy: Secondary | ICD-10-CM | POA: Insufficient documentation

## 2023-07-11 DIAGNOSIS — Z87891 Personal history of nicotine dependence: Secondary | ICD-10-CM | POA: Insufficient documentation

## 2023-07-11 DIAGNOSIS — D500 Iron deficiency anemia secondary to blood loss (chronic): Secondary | ICD-10-CM

## 2023-07-11 DIAGNOSIS — C187 Malignant neoplasm of sigmoid colon: Secondary | ICD-10-CM | POA: Diagnosis present

## 2023-07-11 DIAGNOSIS — R229 Localized swelling, mass and lump, unspecified: Secondary | ICD-10-CM | POA: Insufficient documentation

## 2023-07-11 LAB — COMPREHENSIVE METABOLIC PANEL
ALT: 7 U/L (ref 0–44)
AST: 12 U/L — ABNORMAL LOW (ref 15–41)
Albumin: 3.9 g/dL (ref 3.5–5.0)
Alkaline Phosphatase: 59 U/L (ref 38–126)
Anion gap: 6 (ref 5–15)
BUN: 7 mg/dL — ABNORMAL LOW (ref 8–23)
CO2: 28 mmol/L (ref 22–32)
Calcium: 9 mg/dL (ref 8.9–10.3)
Chloride: 106 mmol/L (ref 98–111)
Creatinine, Ser: 0.98 mg/dL (ref 0.61–1.24)
GFR, Estimated: >60 mL/min (ref >60)
Glucose, Bld: 114 mg/dL — ABNORMAL HIGH (ref 70–99)
Potassium: 3.5 mmol/L (ref 3.5–5.1)
Sodium: 140 mmol/L (ref 135–145)
Total Bilirubin: 0.8 mg/dL (ref 0.3–1.2)
Total Protein: 7.6 g/dL (ref 6.5–8.1)

## 2023-07-11 LAB — CBC WITH DIFFERENTIAL/PLATELET
Abs Immature Granulocytes: 0.02 10*3/uL (ref 0.00–0.07)
Basophils Absolute: 0.1 10*3/uL (ref 0.0–0.1)
Basophils Relative: 1 %
Eosinophils Absolute: 0.1 10*3/uL (ref 0.0–0.5)
Eosinophils Relative: 2 %
HCT: 36.7 % — ABNORMAL LOW (ref 39.0–52.0)
Hemoglobin: 13.2 g/dL (ref 13.0–17.0)
Immature Granulocytes: 0 %
Lymphocytes Relative: 34 %
Lymphs Abs: 2.2 10*3/uL (ref 0.7–4.0)
MCH: 29.5 pg (ref 26.0–34.0)
MCHC: 36 g/dL (ref 30.0–36.0)
MCV: 81.9 fL (ref 80.0–100.0)
Monocytes Absolute: 0.5 10*3/uL (ref 0.1–1.0)
Monocytes Relative: 7 %
Neutro Abs: 3.7 10*3/uL (ref 1.7–7.7)
Neutrophils Relative %: 56 %
Platelets: 225 10*3/uL (ref 150–400)
RBC: 4.48 MIL/uL (ref 4.22–5.81)
RDW: 13.8 % (ref 11.5–15.5)
WBC: 6.6 10*3/uL (ref 4.0–10.5)
nRBC: 0 % (ref 0.0–0.2)

## 2023-07-12 ENCOUNTER — Telehealth: Payer: Self-pay

## 2023-07-12 LAB — CEA (IN HOUSE-CHCC): CEA (CHCC-In House): 2.27 ng/mL (ref 0.00–5.00)

## 2023-07-12 LAB — FERRITIN: Ferritin: 24 ng/mL (ref 24–336)

## 2023-07-12 NOTE — Telephone Encounter (Signed)
LVM stating that Dr. Mosetta Putt spoke with Dr. Archer Asa & Dr. Cliffton Asters regarding the pt's drain tube and all providers agreed that pt should keep the drain tube in place until his upcoming surgery.  Instructed pt to contact Dr. Latanya Maudlin office should he have additional questions or concerns regarding message.

## 2023-07-29 ENCOUNTER — Ambulatory Visit: Payer: Self-pay | Admitting: Surgery

## 2023-07-29 DIAGNOSIS — Z01818 Encounter for other preprocedural examination: Secondary | ICD-10-CM

## 2023-08-12 ENCOUNTER — Encounter (HOSPITAL_COMMUNITY)
Admission: RE | Admit: 2023-08-12 | Discharge: 2023-08-12 | Disposition: A | Discharge status: Home / Self Care | Payer: Commercial Managed Care - HMO | Source: Ambulatory Visit | Attending: Surgery | Admitting: Surgery

## 2023-08-12 ENCOUNTER — Other Ambulatory Visit: Payer: Self-pay

## 2023-08-12 ENCOUNTER — Encounter (HOSPITAL_COMMUNITY): Payer: Self-pay

## 2023-08-12 VITALS — BP 130/77 | HR 71 | Temp 98.2°F | Resp 16 | Ht 74.0 in | Wt 205.0 lb

## 2023-08-12 DIAGNOSIS — Z01818 Encounter for other preprocedural examination: Secondary | ICD-10-CM | POA: Insufficient documentation

## 2023-08-12 DIAGNOSIS — E119 Type 2 diabetes mellitus without complications: Secondary | ICD-10-CM | POA: Insufficient documentation

## 2023-08-12 HISTORY — DX: Gastro-esophageal reflux disease without esophagitis: K21.9

## 2023-08-12 HISTORY — DX: Malignant (primary) neoplasm, unspecified: C80.1

## 2023-08-12 LAB — CBC WITH DIFFERENTIAL/PLATELET
Abs Immature Granulocytes: 0.02 10*3/uL (ref 0.00–0.07)
Basophils Absolute: 0 10*3/uL (ref 0.0–0.1)
Basophils Relative: 0 %
Eosinophils Absolute: 0.1 10*3/uL (ref 0.0–0.5)
Eosinophils Relative: 1 %
HCT: 40.4 % (ref 39.0–52.0)
Hemoglobin: 13.8 g/dL (ref 13.0–17.0)
Immature Granulocytes: 0 %
Lymphocytes Relative: 26 %
Lymphs Abs: 2.2 10*3/uL (ref 0.7–4.0)
MCH: 28.7 pg (ref 26.0–34.0)
MCHC: 34.2 g/dL (ref 30.0–36.0)
MCV: 84 fL (ref 80.0–100.0)
Monocytes Absolute: 0.6 10*3/uL (ref 0.1–1.0)
Monocytes Relative: 6 %
Neutro Abs: 5.8 10*3/uL (ref 1.7–7.7)
Neutrophils Relative %: 67 %
Platelets: 241 10*3/uL (ref 150–400)
RBC: 4.81 MIL/uL (ref 4.22–5.81)
RDW: 14.3 % (ref 11.5–15.5)
WBC: 8.8 10*3/uL (ref 4.0–10.5)
nRBC: 0 % (ref 0.0–0.2)

## 2023-08-12 LAB — COMPREHENSIVE METABOLIC PANEL
ALT: 12 U/L (ref 0–44)
AST: 15 U/L (ref 15–41)
Albumin: 3.8 g/dL (ref 3.5–5.0)
Alkaline Phosphatase: 58 U/L (ref 38–126)
Anion gap: 7 (ref 5–15)
BUN: 10 mg/dL (ref 8–23)
CO2: 25 mmol/L (ref 22–32)
Calcium: 9.4 mg/dL (ref 8.9–10.3)
Chloride: 103 mmol/L (ref 98–111)
Creatinine, Ser: 0.97 mg/dL (ref 0.61–1.24)
GFR, Estimated: >60 mL/min (ref >60)
Glucose, Bld: 85 mg/dL (ref 70–99)
Potassium: 4.1 mmol/L (ref 3.5–5.1)
Sodium: 135 mmol/L (ref 135–145)
Total Bilirubin: 1.3 mg/dL — ABNORMAL HIGH (ref 0.3–1.2)
Total Protein: 8.1 g/dL (ref 6.5–8.1)

## 2023-08-12 LAB — HEMOGLOBIN A1C
Hgb A1c MFr Bld: 5.5 % (ref 4.8–5.6)
Mean Plasma Glucose: 111.15 mg/dL

## 2023-08-12 LAB — GLUCOSE, CAPILLARY: Glucose-Capillary: 116 mg/dL — ABNORMAL HIGH (ref 70–99)

## 2023-08-12 NOTE — Progress Notes (Addendum)
COVID Vaccine Completed: yes  Date of COVID positive in last 90 days: no  PCP - Gwinda Passe, NP Cardiologist - n/a  PET- 06/30/23 Epic Chest x-ray - n/a EKG - 08/12/23 Epic/chart Stress Test - n/a ECHO - n/a Cardiac Cath - n/a Pacemaker/ICD device last checked: n/a Spinal Cord Stimulator: n/a  Bowel Prep - clears liquids day before and prep patient has supplies and instructions   Sleep Study - n/a CPAP -   Fasting Blood Sugar - no glucose checks at home  Checks Blood Sugar   Last dose of GLP1 agonist-  N/A GLP1 instructions:  N/A   Last dose of SGLT-2 inhibitors-  N/A SGLT-2 instructions: N/A   Blood Thinner Instructions:  n/a Aspirin Instructions: Last Dose:  Activity level: Can go up a flight of stairs and perform activities of daily living without stopping and without symptoms of chest pain or shortness of breath.  Anesthesia review:   Patient denies shortness of breath, fever, cough and chest pain at PAT appointment  Patient verbalized understanding of instructions that were given to them at the PAT appointment. Patient was also instructed that they will need to review over the PAT instructions again at home before surgery.

## 2023-08-12 NOTE — Consult Note (Signed)
WOC Nurse requested for preoperative stoma site marking  Discussed surgical procedure and stoma creation with patient. Explained role of the WOC nurse team.  Provided the patient with educational booklet and provided samples of pouching options.  Answered patient questions. We discussed twice weekly pouch changes, pouch options and life with an ostomy.    Examined patient sitting, and standing in order to place the marking in the patient's visual field, away from any creases or abdominal contour issues and within the rectus muscle.  Patient cannot visualize stoma when model is placed below the umbilicus.  He has an anterior abdominal IR drain in place covered with drain sponge and tape.    Marked for colostomy in the LUQ  4  cm to the left of the umbilicus and 1.5 cm above the umbilicus.  Marked for ileostomy in the RUQ  4 cm to the right of the umbilicus and  1.5 cm above the umbilicus.   Patient's abdomen cleansed with CHG wipes at site markings, allowed to air dry prior to marking.Covered mark with thin film transparent dressing to preserve mark until date of surgery.   WOC Nurse team will follow up with patient after surgery for continue ostomy care and teaching.  Mike Gip MSN, RN, FNP-BC CWON Wound, Ostomy, Continence Nurse Outpatient [facility] [phone] Pager [phone]

## 2023-08-12 NOTE — Patient Instructions (Addendum)
SURGICAL WAITING ROOM VISITATION  Patients having surgery or a procedure may have no more than 2 support people in the waiting area - these visitors may rotate.    Children under the age of 85 must have an adult with them who is not the patient.  Due to an increase in RSV and influenza rates and associated hospitalizations, children ages 38 and under may not visit patients in [facility] hospitals.  If the patient needs to stay at the hospital during part of their recovery, the visitor guidelines for inpatient rooms apply. Pre-op nurse will coordinate an appropriate time for 1 support person to accompany patient in pre-op.  This support person may not rotate.    Please refer to the [facility] website for the visitor guidelines for Inpatients (after your surgery is over and you are in a regular room).    Your procedure is scheduled on: 08/15/23   Report to [facility] Main Entrance    Report to admitting at 8:45 AM   Call this number if you have problems the morning of surgery [phone]   Follow a clear liquid diet the day before surgery.   You may have the following liquids until 8:00 AM DAY OF SURGERY  Water Non-Citrus Juices (without pulp, NO RED-Apple, White grape, White cranberry) Black Coffee (NO MILK/CREAM OR CREAMERS, sugar ok)  Clear Tea (NO MILK/CREAM OR CREAMERS, sugar ok) regular and decaf                             Plain Jell-O (NO RED)                                           Fruit ices (not with fruit pulp, NO RED)                                     Popsicles (NO RED)                                                               Sports drinks like Gatorade (NO RED)              Drink 2 G2 drinks AT 10:00 PM the night before surgery.        The day of surgery:  Drink ONE (1) Pre-Surgery G2 at 8:00 AM the morning of surgery. Drink in one sitting. Do not sip.  This drink was given to you during your hospital  pre-op appointment visit. Nothing else  to drink after completing the  Pre-Surgery G2.          If you have questions, please contact your surgeon's office.   FOLLOW BOWEL PREP AND ANY ADDITIONAL PRE OP INSTRUCTIONS YOU RECEIVED FROM YOUR SURGEON'S OFFICE!!!     Oral Hygiene is also important to reduce your risk of infection.                                    Remember -  BRUSH YOUR TEETH THE MORNING OF SURGERY WITH YOUR REGULAR TOOTHPASTE  DENTURES WILL BE REMOVED PRIOR TO SURGERY PLEASE DO NOT APPLY "Poly grip" OR ADHESIVES!!!   Stop all vitamins and herbal supplements 7 days before surgery.   Take these medicines the morning of surgery with A SIP OF WATER: Amlodipine, Pravastatin   DO NOT TAKE ANY ORAL DIABETIC MEDICATIONS DAY OF YOUR SURGERY  How to Manage Your Diabetes Before and After Surgery  Why is it important to control my blood sugar before and after surgery? Improving blood sugar levels before and after surgery helps healing and can limit problems. A way of improving blood sugar control is eating a healthy diet by:  Eating less sugar and carbohydrates  Increasing activity/exercise  Talking with your doctor about reaching your blood sugar goals High blood sugars (greater than 180 mg/dL) can raise your risk of infections and slow your recovery, so you will need to focus on controlling your diabetes during the weeks before surgery. Make sure that the doctor who takes care of your diabetes knows about your planned surgery including the date and location.  How do I manage my blood sugar before surgery? Check your blood sugar at least 4 times a day, starting 2 days before surgery, to make sure that the level is not too high or low. Check your blood sugar the morning of your surgery when you wake up and every 2 hours until you get to the Short Stay unit. If your blood sugar is less than 70 mg/dL, you will need to treat for low blood sugar: Do not take insulin. Treat a low blood sugar (less than 70 mg/dL) with  cup  of clear juice (cranberry or apple), 4 glucose tablets, OR glucose gel. Recheck blood sugar in 15 minutes after treatment (to make sure it is greater than 70 mg/dL). If your blood sugar is not greater than 70 mg/dL on recheck, call [phone] for further instructions. Report your blood sugar to the short stay nurse when you get to Short Stay.  If you are admitted to the hospital after surgery: Your blood sugar will be checked by the staff and you will probably be given insulin after surgery (instead of oral diabetes medicines) to make sure you have good blood sugar levels. The goal for blood sugar control after surgery is 80-180 mg/dL.   WHAT DO I DO ABOUT MY DIABETES MEDICATION?  Do not take oral diabetes medicines (pills) the morning of surgery.  THE DAY BEFORE SURGERY, take Metformin as prescribed.      THE MORNING OF SURGERY, take do not take Metformin   Reviewed and Endorsed by [facility] Patient Education Committee, August 2015                              You may not have any metal on your body including jewelry, and body piercing             Do not wear lotions, powders, cologne, or deodorant              Men may shave face and neck.   Do not bring valuables to the hospital. [facility] IS NOT             RESPONSIBLE   FOR VALUABLES.   Contacts, glasses, dentures or bridgework may not be worn into surgery.   Bring small overnight bag day of surgery.   DO NOT BRING  YOUR HOME MEDICATIONS TO THE HOSPITAL. PHARMACY WILL DISPENSE MEDICATIONS LISTED ON YOUR MEDICATION LIST TO YOU DURING YOUR ADMISSION IN THE HOSPITAL!   Special Instructions: Bring a copy of your healthcare power of attorney and living will documents the day of surgery if you haven't scanned them before.              Please read over the following fact sheets you were given: IF YOU HAVE QUESTIONS ABOUT YOUR PRE-OP INSTRUCTIONS PLEASE CALL [phone] Contras   If you received a COVID test during your  pre-op visit  it is requested that you wear a mask when out in public, stay away from anyone that may not be feeling well and notify your surgeon if you develop symptoms. If you test positive for Covid or have been in contact with anyone that has tested positive in the last 10 days please notify you surgeon.    [facility] - Preparing for Surgery Before surgery, you can play an important role.  Because skin is not sterile, your skin needs to be as free of germs as possible.  You can reduce the number of germs on your skin by washing with CHG (chlorahexidine gluconate) soap before surgery.  CHG is an antiseptic cleaner which kills germs and bonds with the skin to continue killing germs even after washing. Please DO NOT use if you have an allergy to CHG or antibacterial soaps.  If your skin becomes reddened/irritated stop using the CHG and inform your nurse when you arrive at Short Stay. Do not shave (including legs and underarms) for at least 48 hours prior to the first CHG shower.  You may shave your face/neck.  Please follow these instructions carefully:  1.  Shower with CHG Soap the night before surgery and the  morning of surgery.  2.  If you choose to wash your hair, wash your hair first as usual with your normal  shampoo.  3.  After you shampoo, rinse your hair and body thoroughly to remove the shampoo.                             4.  Use CHG as you would any other liquid soap.  You can apply chg directly to the skin and wash.  Gently with a scrungie or clean washcloth.  5.  Apply the CHG Soap to your body ONLY FROM THE NECK DOWN.   Do   not use on face/ open                           Wound or open sores. Avoid contact with eyes, ears mouth and   genitals (private parts).                       Wash face,  Genitals (private parts) with your normal soap.             6.  Wash thoroughly, paying special attention to the area where your    surgery  will be performed.  7.  Thoroughly rinse your body  with warm water from the neck down.  8.  DO NOT shower/wash with your normal soap after using and rinsing off the CHG Soap.                9.  Pat yourself dry with a clean towel.  10.  Wear clean pajamas.            11.  Place clean sheets on your bed the night of your first shower and do not  sleep with pets. Day of Surgery : Do not apply any lotions/deodorants the morning of surgery.  Please wear clean clothes to the hospital/surgery center.  FAILURE TO FOLLOW THESE INSTRUCTIONS MAY RESULT IN THE CANCELLATION OF YOUR SURGERY  PATIENT SIGNATURE_________________________________  NURSE SIGNATURE__________________________________  ________________________________________________________________________  Rogelia Mire  An incentive spirometer is a tool that can help keep your lungs clear and active. This tool measures how well you are filling your lungs with each breath. Taking long deep breaths may help reverse or decrease the chance of developing breathing (pulmonary) problems (especially infection) following: A long period of time when you are unable to move or be active. BEFORE THE PROCEDURE  If the spirometer includes an indicator to show your best effort, your nurse or respiratory therapist will set it to a desired goal. If possible, sit up straight or lean slightly forward. Try not to slouch. Hold the incentive spirometer in an upright position. INSTRUCTIONS FOR USE  Sit on the edge of your bed if possible, or sit up as far as you can in bed or on a chair. Hold the incentive spirometer in an upright position. Breathe out normally. Place the mouthpiece in your mouth and seal your lips tightly around it. Breathe in slowly and as deeply as possible, raising the piston or the ball toward the top of the column. Hold your breath for 3-5 seconds or for as long as possible. Allow the piston or ball to fall to the bottom of the column. Remove the mouthpiece from your mouth  and breathe out normally. Rest for a few seconds and repeat Steps 1 through 7 at least 10 times every 1-2 hours when you are awake. Take your time and take a few normal breaths between deep breaths. The spirometer may include an indicator to show your best effort. Use the indicator as a goal to work toward during each repetition. After each set of 10 deep breaths, practice coughing to be sure your lungs are clear. If you have an incision (the cut made at the time of surgery), support your incision when coughing by placing a pillow or rolled up towels firmly against it. Once you are able to get out of bed, walk around indoors and cough well. You may stop using the incentive spirometer when instructed by your caregiver.  RISKS AND COMPLICATIONS Take your time so you do not get dizzy or light-headed. If you are in pain, you may need to take or ask for pain medication before doing incentive spirometry. It is harder to take a deep breath if you are having pain. AFTER USE Rest and breathe slowly and easily. It can be helpful to keep track of a log of your progress. Your caregiver can provide you with a simple table to help with this. If you are using the spirometer at home, follow these instructions: SEEK MEDICAL CARE IF:  You are having difficultly using the spirometer. You have trouble using the spirometer as often as instructed. Your pain medication is not giving enough relief while using the spirometer. You develop fever of 100.5 F (38.1 C) or higher. SEEK IMMEDIATE MEDICAL CARE IF:  You cough up bloody sputum that had not been present before. You develop fever of 102 F (38.9 C) or greater. You develop worsening pain at or near  the incision site. MAKE SURE YOU:  Understand these instructions. Will watch your condition. Will get help right away if you are not doing well or get worse. Document Released: 02/28/2007 Document Revised: 01/10/2012 Document Reviewed: 05/01/2007 [employer] Patient  Information 2014 [employer], [place].   ________________________________________________________________________ WHAT IS A BLOOD TRANSFUSION? Blood Transfusion Information  A transfusion is the replacement of blood or some of its parts. Blood is made up of multiple cells which provide different functions. Red blood cells carry oxygen and are used for blood loss replacement. White blood cells fight against infection. Platelets control bleeding. Plasma helps clot blood. Other blood products are available for specialized needs, such as hemophilia or other clotting disorders. BEFORE THE TRANSFUSION  Who gives blood for transfusions?  Healthy volunteers who are fully evaluated to make sure their blood is safe. This is blood bank blood. Transfusion therapy is the safest it has ever been in the practice of medicine. Before blood is taken from a donor, a complete history is taken to make sure that person has no history of diseases nor engages in risky social behavior (examples are intravenous drug use or sexual activity with multiple partners). The donor's travel history is screened to minimize risk of transmitting infections, such as malaria. The donated blood is tested for signs of infectious diseases, such as HIV and hepatitis. The blood is then tested to be sure it is compatible with you in order to minimize the chance of a transfusion reaction. If you or a relative donates blood, this is often done in anticipation of surgery and is not appropriate for emergency situations. It takes many days to process the donated blood. RISKS AND COMPLICATIONS Although transfusion therapy is very safe and saves many lives, the main dangers of transfusion include:  Getting an infectious disease. Developing a transfusion reaction. This is an allergic reaction to something in the blood you were given. Every precaution is taken to prevent this. The decision to have a blood transfusion has been considered carefully by your  caregiver before blood is given. Blood is not given unless the benefits outweigh the risks. AFTER THE TRANSFUSION Right after receiving a blood transfusion, you will usually feel much better and more energetic. This is especially true if your red blood cells have gotten low (anemic). The transfusion raises the level of the red blood cells which carry oxygen, and this usually causes an energy increase. The nurse administering the transfusion will monitor you carefully for complications. HOME CARE INSTRUCTIONS  No special instructions are needed after a transfusion. You may find your energy is better. Speak with your caregiver about any limitations on activity for underlying diseases you may have. SEEK MEDICAL CARE IF:  Your condition is not improving after your transfusion. You develop redness or irritation at the intravenous (IV) site. SEEK IMMEDIATE MEDICAL CARE IF:  Any of the following symptoms occur over the next 12 hours: Shaking chills. You have a temperature by mouth above 102 F (38.9 C), not controlled by medicine. Chest, back, or muscle pain. People around you feel you are not acting correctly or are confused. Shortness of breath or difficulty breathing. Dizziness and fainting. You get a rash or develop hives. You have a decrease in urine output. Your urine turns a dark color or changes to pink, red, or brown. Any of the following symptoms occur over the next 10 days: You have a temperature by mouth above 102 F (38.9 C), not controlled by medicine. Shortness of breath. Weakness after normal activity.  The white part of the eye turns yellow (jaundice). You have a decrease in the amount of urine or are urinating less often. Your urine turns a dark color or changes to pink, red, or brown. Document Released: 10/15/2000 Document Revised: 01/10/2012 Document Reviewed: 06/03/2008 [facility] Patient Information 2014 [place],  [place].  _______________________________________________________________________

## 2023-08-15 ENCOUNTER — Encounter (HOSPITAL_COMMUNITY)
Admission: RE | Disposition: A | Discharge status: Home / Self Care | Payer: Self-pay | Source: Ambulatory Visit | Attending: Surgery

## 2023-08-15 ENCOUNTER — Inpatient Hospital Stay (HOSPITAL_COMMUNITY): Payer: Commercial Managed Care - HMO | Admitting: Anesthesiology

## 2023-08-15 ENCOUNTER — Other Ambulatory Visit: Payer: Self-pay

## 2023-08-15 ENCOUNTER — Inpatient Hospital Stay (HOSPITAL_COMMUNITY)
Admission: RE | Admit: 2023-08-15 | Discharge: 2023-08-17 | DRG: 329 | Disposition: A | Discharge status: Home / Self Care | Payer: Commercial Managed Care - HMO | Source: Ambulatory Visit | Attending: Surgery | Admitting: Surgery

## 2023-08-15 ENCOUNTER — Encounter (HOSPITAL_COMMUNITY): Payer: Self-pay | Admitting: Surgery

## 2023-08-15 DIAGNOSIS — K660 Peritoneal adhesions (postprocedural) (postinfection): Secondary | ICD-10-CM | POA: Diagnosis present

## 2023-08-15 DIAGNOSIS — E119 Type 2 diabetes mellitus without complications: Secondary | ICD-10-CM | POA: Diagnosis present

## 2023-08-15 DIAGNOSIS — Z85038 Personal history of other malignant neoplasm of large intestine: Secondary | ICD-10-CM

## 2023-08-15 DIAGNOSIS — Z885 Allergy status to narcotic agent status: Secondary | ICD-10-CM

## 2023-08-15 DIAGNOSIS — K219 Gastro-esophageal reflux disease without esophagitis: Secondary | ICD-10-CM | POA: Diagnosis present

## 2023-08-15 DIAGNOSIS — E785 Hyperlipidemia, unspecified: Secondary | ICD-10-CM | POA: Diagnosis present

## 2023-08-15 DIAGNOSIS — Z560 Unemployment, unspecified: Secondary | ICD-10-CM

## 2023-08-15 DIAGNOSIS — C19 Malignant neoplasm of rectosigmoid junction: Principal | ICD-10-CM | POA: Diagnosis present

## 2023-08-15 DIAGNOSIS — M1611 Unilateral primary osteoarthritis, right hip: Secondary | ICD-10-CM

## 2023-08-15 DIAGNOSIS — K651 Peritoneal abscess: Secondary | ICD-10-CM | POA: Diagnosis present

## 2023-08-15 DIAGNOSIS — Z9049 Acquired absence of other specified parts of digestive tract: Principal | ICD-10-CM

## 2023-08-15 DIAGNOSIS — I4891 Unspecified atrial fibrillation: Secondary | ICD-10-CM

## 2023-08-15 DIAGNOSIS — I10 Essential (primary) hypertension: Secondary | ICD-10-CM | POA: Diagnosis present

## 2023-08-15 DIAGNOSIS — Z87891 Personal history of nicotine dependence: Secondary | ICD-10-CM

## 2023-08-15 HISTORY — PX: LAPAROSCOPY: SHX197

## 2023-08-15 HISTORY — PX: XI ROBOTIC ASSISTED LOWER ANTERIOR RESECTION: SHX6558

## 2023-08-15 HISTORY — PX: FLEXIBLE SIGMOIDOSCOPY: SHX5431

## 2023-08-15 LAB — ABO/RH: ABO/RH(D): O POS

## 2023-08-15 LAB — TYPE AND SCREEN
ABO/RH(D): O POS
Antibody Screen: NEGATIVE

## 2023-08-15 LAB — GLUCOSE, CAPILLARY
Glucose-Capillary: 117 mg/dL — ABNORMAL HIGH (ref 70–99)
Glucose-Capillary: 189 mg/dL — ABNORMAL HIGH (ref 70–99)

## 2023-08-15 SURGERY — RESECTION, RECTUM, LOW ANTERIOR, ROBOT-ASSISTED
Anesthesia: General

## 2023-08-15 MED ORDER — HYDRALAZINE HCL 20 MG/ML IJ SOLN: 10.0000 mg | INTRAMUSCULAR | Status: DC | PRN

## 2023-08-15 MED ORDER — INDOCYANINE GREEN 25 MG IV SOLR
INTRAVENOUS | Status: DC | PRN
Start: 2023-08-15 — End: 2023-08-15
  Administered 2023-08-15: 2.5 mg via INTRAVENOUS

## 2023-08-15 MED ORDER — FENTANYL CITRATE (PF) 100 MCG/2ML IJ SOLN
INTRAMUSCULAR | Status: AC
  Filled 2023-08-15: qty 2

## 2023-08-15 MED ORDER — ENSURE PRE-SURGERY PO LIQD
592.0000 mL | Freq: Once | ORAL | Status: DC
  Filled 2023-08-15: qty 592

## 2023-08-15 MED ORDER — ALUM & MAG HYDROXIDE-SIMETH 200-200-20 MG/5ML PO SUSP
30.0000 mL | Freq: Four times a day (QID) | ORAL | Status: DC | PRN
  Administered 2023-08-16 (×2): 30 mL via ORAL
  Filled 2023-08-15: qty 30

## 2023-08-15 MED ORDER — LIDOCAINE HCL (PF) 2 % IJ SOLN
INTRAMUSCULAR | Status: DC | PRN
Start: 2023-08-15 — End: 2023-08-15
  Administered 2023-08-15: 1.5 mg/kg/h via INTRADERMAL

## 2023-08-15 MED ORDER — DIPHENHYDRAMINE HCL 50 MG/ML IJ SOLN: 12.5000 mg | Freq: Four times a day (QID) | INTRAMUSCULAR | Status: DC | PRN

## 2023-08-15 MED ORDER — HYDROMORPHONE HCL 1 MG/ML IJ SOLN: 0.5000 mg | INTRAMUSCULAR | Status: DC | PRN

## 2023-08-15 MED ORDER — AMLODIPINE BESYLATE 10 MG PO TABS
10.0000 mg | ORAL_TABLET | Freq: Every day | ORAL | Status: DC
  Administered 2023-08-16 – 2023-08-17 (×2): 10 mg via ORAL
  Filled 2023-08-15 (×2): qty 1

## 2023-08-15 MED ORDER — BUPIVACAINE LIPOSOME 1.3 % IJ SUSP
INTRAMUSCULAR | Status: AC
  Filled 2023-08-15: qty 20

## 2023-08-15 MED ORDER — STERILE WATER FOR IRRIGATION IR SOLN
Status: DC | PRN
Start: 2023-08-15 — End: 2023-08-15
  Administered 2023-08-15: 500 mL

## 2023-08-15 MED ORDER — KETOROLAC TROMETHAMINE 30 MG/ML IJ SOLN
INTRAMUSCULAR | Status: DC | PRN
Start: 2023-08-15 — End: 2023-08-15
  Administered 2023-08-15: 30 mg via INTRAVENOUS

## 2023-08-15 MED ORDER — ACETAMINOPHEN 500 MG PO TABS
1000.0000 mg | ORAL_TABLET | ORAL | Status: AC
  Administered 2023-08-15: 1000 mg via ORAL
  Filled 2023-08-15: qty 2

## 2023-08-15 MED ORDER — PROPOFOL 10 MG/ML IV BOLUS
INTRAVENOUS | Status: AC
  Filled 2023-08-15: qty 20

## 2023-08-15 MED ORDER — OXYCODONE HCL 5 MG/5ML PO SOLN: 5.0000 mg | Freq: Once | ORAL | Status: AC | PRN

## 2023-08-15 MED ORDER — MIDAZOLAM HCL 5 MG/5ML IJ SOLN
INTRAMUSCULAR | Status: DC | PRN
  Administered 2023-08-15: 2 mg via INTRAVENOUS

## 2023-08-15 MED ORDER — OXYCODONE HCL 5 MG PO TABS
ORAL_TABLET | ORAL | Status: AC
  Filled 2023-08-15: qty 1

## 2023-08-15 MED ORDER — BISACODYL 5 MG PO TBEC: 20.0000 mg | DELAYED_RELEASE_TABLET | Freq: Once | ORAL | Status: DC

## 2023-08-15 MED ORDER — CHLORHEXIDINE GLUCONATE CLOTH 2 % EX PADS: 6.0000 | MEDICATED_PAD | Freq: Once | CUTANEOUS | Status: DC

## 2023-08-15 MED ORDER — HYDROMORPHONE HCL 1 MG/ML IJ SOLN: 0.2500 mg | INTRAMUSCULAR | Status: DC | PRN

## 2023-08-15 MED ORDER — LACTATED RINGERS IV SOLN: INTRAVENOUS | Status: DC

## 2023-08-15 MED ORDER — HEPARIN SODIUM (PORCINE) 5000 UNIT/ML IJ SOLN
5000.0000 [IU] | Freq: Three times a day (TID) | INTRAMUSCULAR | Status: DC
  Administered 2023-08-15 – 2023-08-17 (×5): 5000 [IU] via SUBCUTANEOUS
  Filled 2023-08-15 (×5): qty 1

## 2023-08-15 MED ORDER — SODIUM CHLORIDE 0.9 % IV SOLN
2.0000 g | INTRAVENOUS | Status: AC
  Administered 2023-08-15: 2 g via INTRAVENOUS
  Filled 2023-08-15: qty 2

## 2023-08-15 MED ORDER — SIMETHICONE 80 MG PO CHEW: 40.0000 mg | CHEWABLE_TABLET | Freq: Four times a day (QID) | ORAL | Status: DC | PRN

## 2023-08-15 MED ORDER — SUGAMMADEX SODIUM 200 MG/2ML IV SOLN
INTRAVENOUS | Status: DC | PRN
  Administered 2023-08-15: 200 mg via INTRAVENOUS

## 2023-08-15 MED ORDER — 0.9 % SODIUM CHLORIDE (POUR BTL) OPTIME
TOPICAL | Status: DC | PRN
Start: 2023-08-15 — End: 2023-08-15
  Administered 2023-08-15: 500 mL
  Administered 2023-08-15: 1000 mL

## 2023-08-15 MED ORDER — BUPIVACAINE-EPINEPHRINE 0.25% -1:200000 IJ SOLN
INTRAMUSCULAR | Status: AC
  Filled 2023-08-15: qty 1

## 2023-08-15 MED ORDER — BUPIVACAINE LIPOSOME 1.3 % IJ SUSP: 20.0000 mL | Freq: Once | INTRAMUSCULAR | Status: DC

## 2023-08-15 MED ORDER — ALVIMOPAN 12 MG PO CAPS
12.0000 mg | ORAL_CAPSULE | ORAL | Status: AC
  Administered 2023-08-15: 12 mg via ORAL
  Filled 2023-08-15: qty 1

## 2023-08-15 MED ORDER — OXYCODONE HCL 5 MG PO TABS
5.0000 mg | ORAL_TABLET | Freq: Once | ORAL | Status: AC | PRN
  Administered 2023-08-15: 5 mg via ORAL

## 2023-08-15 MED ORDER — FENTANYL CITRATE (PF) 100 MCG/2ML IJ SOLN
INTRAMUSCULAR | Status: DC | PRN
  Administered 2023-08-15 (×3): 50 ug via INTRAVENOUS
  Administered 2023-08-15: 100 ug via INTRAVENOUS
  Administered 2023-08-15: 50 ug via INTRAVENOUS

## 2023-08-15 MED ORDER — ROCURONIUM BROMIDE 100 MG/10ML IV SOLN
INTRAVENOUS | Status: DC | PRN
  Administered 2023-08-15: 10 mg via INTRAVENOUS
  Administered 2023-08-15: 60 mg via INTRAVENOUS
  Administered 2023-08-15: 10 mg via INTRAVENOUS

## 2023-08-15 MED ORDER — ONDANSETRON HCL 4 MG/2ML IJ SOLN
INTRAMUSCULAR | Status: AC
  Filled 2023-08-15: qty 2

## 2023-08-15 MED ORDER — TRAMADOL HCL 50 MG PO TABS: 50.0000 mg | ORAL_TABLET | Freq: Four times a day (QID) | ORAL | Status: DC | PRN

## 2023-08-15 MED ORDER — IBUPROFEN 400 MG PO TABS
600.0000 mg | ORAL_TABLET | Freq: Four times a day (QID) | ORAL | Status: DC | PRN
  Administered 2023-08-15 – 2023-08-16 (×3): 600 mg via ORAL
  Filled 2023-08-15 (×3): qty 1

## 2023-08-15 MED ORDER — DEXAMETHASONE SODIUM PHOSPHATE 10 MG/ML IJ SOLN
INTRAMUSCULAR | Status: AC
  Filled 2023-08-15: qty 1

## 2023-08-15 MED ORDER — LIDOCAINE HCL (CARDIAC) PF 100 MG/5ML IV SOSY
PREFILLED_SYRINGE | INTRAVENOUS | Status: DC | PRN
  Administered 2023-08-15: 40 mg via INTRAVENOUS

## 2023-08-15 MED ORDER — PROPOFOL 10 MG/ML IV BOLUS
INTRAVENOUS | Status: DC | PRN
  Administered 2023-08-15: 150 mg via INTRAVENOUS

## 2023-08-15 MED ORDER — ROCURONIUM BROMIDE 10 MG/ML (PF) SYRINGE
PREFILLED_SYRINGE | INTRAVENOUS | Status: AC
  Filled 2023-08-15: qty 10

## 2023-08-15 MED ORDER — DIPHENHYDRAMINE HCL 12.5 MG/5ML PO ELIX: 12.5000 mg | ORAL_SOLUTION | Freq: Four times a day (QID) | ORAL | Status: DC | PRN

## 2023-08-15 MED ORDER — LIDOCAINE HCL (PF) 2 % IJ SOLN
INTRAMUSCULAR | Status: AC
  Filled 2023-08-15: qty 5

## 2023-08-15 MED ORDER — POLYETHYLENE GLYCOL 3350 17 GM/SCOOP PO POWD
1.0000 | Freq: Once | ORAL | Status: DC
  Filled 2023-08-15: qty 255

## 2023-08-15 MED ORDER — ONDANSETRON HCL 4 MG/2ML IJ SOLN: 4.0000 mg | Freq: Four times a day (QID) | INTRAMUSCULAR | Status: DC | PRN

## 2023-08-15 MED ORDER — ALVIMOPAN 12 MG PO CAPS
12.0000 mg | ORAL_CAPSULE | Freq: Two times a day (BID) | ORAL | Status: DC
  Administered 2023-08-16: 12 mg via ORAL
  Filled 2023-08-15 (×2): qty 1

## 2023-08-15 MED ORDER — NEOMYCIN SULFATE 500 MG PO TABS
1000.0000 mg | ORAL_TABLET | ORAL | Status: DC
  Filled 2023-08-15: qty 2

## 2023-08-15 MED ORDER — METRONIDAZOLE 500 MG PO TABS
1000.0000 mg | ORAL_TABLET | ORAL | Status: DC
  Filled 2023-08-15: qty 2

## 2023-08-15 MED ORDER — MIDAZOLAM HCL 2 MG/2ML IJ SOLN
INTRAMUSCULAR | Status: AC
  Filled 2023-08-15: qty 2

## 2023-08-15 MED ORDER — ACETAMINOPHEN 500 MG PO TABS
1000.0000 mg | ORAL_TABLET | Freq: Four times a day (QID) | ORAL | Status: DC
  Administered 2023-08-15 – 2023-08-17 (×7): 1000 mg via ORAL
  Filled 2023-08-15 (×8): qty 2

## 2023-08-15 MED ORDER — DEXAMETHASONE SODIUM PHOSPHATE 10 MG/ML IJ SOLN
INTRAMUSCULAR | Status: DC | PRN
  Administered 2023-08-15: 10 mg via INTRAVENOUS

## 2023-08-15 MED ORDER — HEPARIN SODIUM (PORCINE) 5000 UNIT/ML IJ SOLN
5000.0000 [IU] | Freq: Once | INTRAMUSCULAR | Status: AC
  Administered 2023-08-15: 5000 [IU] via SUBCUTANEOUS
  Filled 2023-08-15: qty 1

## 2023-08-15 MED ORDER — ENSURE SURGERY PO LIQD
237.0000 mL | Freq: Two times a day (BID) | ORAL | Status: DC
  Administered 2023-08-16 – 2023-08-17 (×2): 237 mL via ORAL
  Filled 2023-08-15: qty 237

## 2023-08-15 MED ORDER — ONDANSETRON HCL 4 MG PO TABS: 4.0000 mg | ORAL_TABLET | Freq: Four times a day (QID) | ORAL | Status: DC | PRN

## 2023-08-15 MED ORDER — ENSURE PRE-SURGERY PO LIQD
296.0000 mL | Freq: Once | ORAL | Status: DC
  Filled 2023-08-15: qty 296

## 2023-08-15 MED ORDER — MIDAZOLAM HCL 2 MG/2ML IJ SOLN: 0.5000 mg | Freq: Once | INTRAMUSCULAR | Status: DC | PRN

## 2023-08-15 MED ORDER — INSULIN ASPART 100 UNIT/ML IJ SOLN: 0.0000 [IU] | INTRAMUSCULAR | Status: DC | PRN

## 2023-08-15 MED ORDER — PRAVASTATIN SODIUM 20 MG PO TABS
20.0000 mg | ORAL_TABLET | Freq: Every day | ORAL | Status: DC
  Administered 2023-08-16 – 2023-08-17 (×2): 20 mg via ORAL
  Filled 2023-08-15 (×2): qty 1

## 2023-08-15 MED ORDER — ORAL CARE MOUTH RINSE: 15.0000 mL | Freq: Once | OROMUCOSAL | Status: DC

## 2023-08-15 MED ORDER — CHLORHEXIDINE GLUCONATE 0.12 % MT SOLN: 15.0000 mL | Freq: Once | OROMUCOSAL | Status: DC

## 2023-08-15 MED ORDER — LIDOCAINE HCL 2 % IJ SOLN
INTRAMUSCULAR | Status: AC
  Filled 2023-08-15: qty 20

## 2023-08-15 MED ORDER — BUPIVACAINE-EPINEPHRINE (PF) 0.25% -1:200000 IJ SOLN
INTRAMUSCULAR | Status: DC | PRN
  Administered 2023-08-15: 50 mL

## 2023-08-15 MED ORDER — MEPERIDINE HCL 50 MG/ML IJ SOLN: 6.2500 mg | INTRAMUSCULAR | Status: DC | PRN

## 2023-08-15 SURGICAL SUPPLY — 129 items
ADH SKN CLS APL DERMABOND .7 (GAUZE/BANDAGES/DRESSINGS) ×1
APL PRP STRL LF DISP 70% ISPRP (MISCELLANEOUS) ×1
APPLIER CLIP 5 13 M/L LIGAMAX5 (MISCELLANEOUS)
APPLIER CLIP ROT 10 11.4 M/L (STAPLE)
APR CLP MED LRG 11.4X10 (STAPLE)
APR CLP MED LRG 5 ANG JAW (MISCELLANEOUS)
BAG COUNTER SPONGE SURGICOUNT (BAG) IMPLANT
BAG SPEC RTRVL 10 TROC 200 (ENDOMECHANICALS)
BAG SPNG CNTER NS LX DISP (BAG)
BLADE EXTENDED COATED 6.5IN (ELECTRODE) ×1 IMPLANT
CABLE HIGH FREQUENCY MONO STRZ (ELECTRODE) IMPLANT
CANNULA REDUCER 12-8 DVNC XI (CANNULA) ×1 IMPLANT
CHLORAPREP W/TINT 26 (MISCELLANEOUS) ×1 IMPLANT
CLIP APPLIE 5 13 M/L LIGAMAX5 (MISCELLANEOUS) IMPLANT
CLIP APPLIE ROT 10 11.4 M/L (STAPLE) IMPLANT
CLIP LIGATING HEM O LOK PURPLE (MISCELLANEOUS) IMPLANT
CLIP LIGATING HEMO O LOK GREEN (MISCELLANEOUS) IMPLANT
COVER MAYO STAND STRL (DRAPES) IMPLANT
COVER SURGICAL LIGHT HANDLE (MISCELLANEOUS) ×2 IMPLANT
COVER TIP SHEARS 8 DVNC (MISCELLANEOUS) ×1 IMPLANT
DEFOGGER SCOPE WARMER CLEARIFY (MISCELLANEOUS) ×1 IMPLANT
DERMABOND ADVANCED .7 DNX12 (GAUZE/BANDAGES/DRESSINGS) IMPLANT
DEVICE TROCAR PUNCTURE CLOSURE (ENDOMECHANICALS) IMPLANT
DRAIN CHANNEL 19F RND (DRAIN) ×1 IMPLANT
DRAPE ARM DVNC X/XI (DISPOSABLE) ×4 IMPLANT
DRAPE COLUMN DVNC XI (DISPOSABLE) ×1 IMPLANT
DRAPE SURG IRRIG POUCH 19X23 (DRAPES) ×1 IMPLANT
DRIVER NDL LRG 8 DVNC XI (INSTRUMENTS) ×1 IMPLANT
DRIVER NDLE LRG 8 DVNC XI (INSTRUMENTS) ×1
DRSG OPSITE POSTOP 4X10 (GAUZE/BANDAGES/DRESSINGS) IMPLANT
DRSG OPSITE POSTOP 4X6 (GAUZE/BANDAGES/DRESSINGS) IMPLANT
DRSG OPSITE POSTOP 4X8 (GAUZE/BANDAGES/DRESSINGS) IMPLANT
DRSG TEGADERM 2-3/8X2-3/4 SM (GAUZE/BANDAGES/DRESSINGS) ×5 IMPLANT
DRSG TEGADERM 4X4.75 (GAUZE/BANDAGES/DRESSINGS) ×1 IMPLANT
ELECT PENCIL ROCKER SW 15FT (MISCELLANEOUS) IMPLANT
ELECT REM PT RETURN 15FT ADLT (MISCELLANEOUS) ×1 IMPLANT
ENDOLOOP SUT PDS II 0 18 (SUTURE) IMPLANT
EVACUATOR SILICONE 100CC (DRAIN) ×1 IMPLANT
FORCEPS BPLR FENES DVNC XI (FORCEP) IMPLANT
GAUZE SPONGE 2X2 8PLY STRL LF (GAUZE/BANDAGES/DRESSINGS) ×1 IMPLANT
GAUZE SPONGE 4X4 12PLY STRL (GAUZE/BANDAGES/DRESSINGS) ×1 IMPLANT
GLOVE BIO SURGEON STRL SZ7.5 (GLOVE) ×3 IMPLANT
GLOVE INDICATOR 8.0 STRL GRN (GLOVE) ×3 IMPLANT
GOWN SRG XL LVL 4 BRTHBL STRL (GOWNS) ×1 IMPLANT
GOWN STRL NON-REIN XL LVL4 (GOWNS) ×1
GOWN STRL REUS W/ TWL XL LVL3 (GOWN DISPOSABLE) ×5 IMPLANT
GOWN STRL REUS W/TWL XL LVL3 (GOWN DISPOSABLE) ×5
GRASPER SUT TROCAR 14GX15 (MISCELLANEOUS) IMPLANT
GRASPER TIP-UP FEN DVNC XI (INSTRUMENTS) ×1 IMPLANT
HANDLE SUCTION POOLE (INSTRUMENTS) IMPLANT
HOLDER FOLEY CATH W/STRAP (MISCELLANEOUS) ×1 IMPLANT
IRRIG SUCT STRYKERFLOW 2 WTIP (MISCELLANEOUS) ×1
IRRIGATION SUCT STRKRFLW 2 WTP (MISCELLANEOUS) ×1 IMPLANT
KIT PROCEDURE DVNC SI (MISCELLANEOUS) IMPLANT
KIT TURNOVER KIT A (KITS) IMPLANT
LIGASURE IMPACT 36 18CM CVD LR (INSTRUMENTS) IMPLANT
NDL INSUFFLATION 14GA 120MM (NEEDLE) ×1 IMPLANT
NEEDLE INSUFFLATION 14GA 120MM (NEEDLE) ×1
PACK CARDIOVASCULAR III (CUSTOM PROCEDURE TRAY) ×1 IMPLANT
PACK COLON (CUSTOM PROCEDURE TRAY) ×1 IMPLANT
PAD POSITIONING PINK XL (MISCELLANEOUS) ×1 IMPLANT
PENCIL SMOKE EVACUATOR (MISCELLANEOUS) IMPLANT
POUCH RETRIEVAL ECOSAC 10 (ENDOMECHANICALS) IMPLANT
PROTECTOR NERVE ULNAR (MISCELLANEOUS) ×2 IMPLANT
RELOAD PROXIMATE 75MM BLUE (ENDOMECHANICALS) ×2
RELOAD STAPLE 45 3.5 BLU DVNC (STAPLE) IMPLANT
RELOAD STAPLE 45 4.3 GRN DVNC (STAPLE) IMPLANT
RELOAD STAPLE 60 3.5 BLU DVNC (STAPLE) IMPLANT
RELOAD STAPLE 60 4.3 GRN DVNC (STAPLE) IMPLANT
RELOAD STAPLE 75 3.8 BLU REG (ENDOMECHANICALS) IMPLANT
RETRACTOR WND ALEXIS 18 MED (MISCELLANEOUS) IMPLANT
RTRCTR WOUND ALEXIS 18CM MED (MISCELLANEOUS)
SCISSORS LAP 5X35 DISP (ENDOMECHANICALS) IMPLANT
SCISSORS MNPLR CVD DVNC XI (INSTRUMENTS) ×1 IMPLANT
SEAL UNIV 5-12 XI (MISCELLANEOUS) ×4 IMPLANT
SEALER TISSUE G2 STRG ARTC 35C (ENDOMECHANICALS) IMPLANT
SEALER VESSEL EXT DVNC XI (MISCELLANEOUS) ×1 IMPLANT
SLEEVE ADV FIXATION 5X100MM (TROCAR) ×1 IMPLANT
SOL ELECTROSURG ANTI STICK (MISCELLANEOUS) ×1
SOLUTION ELECTROSURG ANTI STCK (MISCELLANEOUS) ×1 IMPLANT
SPIKE FLUID TRANSFER (MISCELLANEOUS) ×1 IMPLANT
STAPLER 60 SUREFORM DVNC (STAPLE) IMPLANT
STAPLER ECHELON POWER CIR 29 (STAPLE) IMPLANT
STAPLER ECHELON POWER CIR 31 (STAPLE) IMPLANT
STAPLER GUN LINEAR PROX 60 (STAPLE) IMPLANT
STAPLER PROXIMATE 75MM BLUE (STAPLE) IMPLANT
STAPLER RELOAD 3.5X45 BLU DVNC (STAPLE)
STAPLER RELOAD 3.5X60 BLU DVNC (STAPLE)
STAPLER RELOAD 4.3X45 GRN DVNC (STAPLE)
STAPLER RELOAD 4.3X60 GRN DVNC (STAPLE) ×2
STOPCOCK 4 WAY LG BORE MALE ST (IV SETS) ×2 IMPLANT
SUCTION POOLE HANDLE (INSTRUMENTS)
SURGILUBE 2OZ TUBE FLIPTOP (MISCELLANEOUS) ×1 IMPLANT
SUT MNCRL AB 4-0 PS2 18 (SUTURE) ×1 IMPLANT
SUT PDS AB 1 CT1 27 (SUTURE) IMPLANT
SUT PDS AB 1 TP1 96 (SUTURE) IMPLANT
SUT PROLENE 0 CT 2 (SUTURE) IMPLANT
SUT PROLENE 2 0 KS (SUTURE) ×1 IMPLANT
SUT PROLENE 2 0 SH DA (SUTURE) IMPLANT
SUT SILK 2 0 (SUTURE)
SUT SILK 2 0 SH CR/8 (SUTURE) IMPLANT
SUT SILK 2-0 18XBRD TIE 12 (SUTURE) IMPLANT
SUT SILK 3 0 (SUTURE) ×1
SUT SILK 3 0 SH CR/8 (SUTURE) ×1 IMPLANT
SUT SILK 3-0 18XBRD TIE 12 (SUTURE) ×1 IMPLANT
SUT V-LOC BARB 180 2/0GR6 GS22 (SUTURE)
SUT VIC AB 3-0 SH 18 (SUTURE) IMPLANT
SUT VIC AB 3-0 SH 27 (SUTURE)
SUT VIC AB 3-0 SH 27XBRD (SUTURE) IMPLANT
SUT VICRYL 0 UR6 27IN ABS (SUTURE) ×1 IMPLANT
SUTURE V-LC BRB 180 2/0GR6GS22 (SUTURE) IMPLANT
SYR 10ML LL (SYRINGE) ×1 IMPLANT
SYS BAG RETRIEVAL 10MM (BASKET)
SYS LAPSCP GELPORT 120MM (MISCELLANEOUS)
SYS WOUND ALEXIS 18CM MED (MISCELLANEOUS) ×1
SYSTEM BAG RETRIEVAL 10MM (BASKET) IMPLANT
SYSTEM LAPSCP GELPORT 120MM (MISCELLANEOUS) IMPLANT
SYSTEM WOUND ALEXIS 18CM MED (MISCELLANEOUS) ×1 IMPLANT
TAPE UMBILICAL 1/8 X36 TWILL (MISCELLANEOUS) ×1 IMPLANT
TOWEL OR 17X26 10 PK STRL BLUE (TOWEL DISPOSABLE) ×1 IMPLANT
TOWEL OR NON WOVEN STRL DISP B (DISPOSABLE) ×1 IMPLANT
TRAY FOLEY MTR SLVR 16FR STAT (SET/KITS/TRAYS/PACK) ×1 IMPLANT
TRAY LAPAROSCOPIC (CUSTOM PROCEDURE TRAY) ×1 IMPLANT
TROCAR ADV FIXATION 12X100MM (TROCAR) IMPLANT
TROCAR ADV FIXATION 5X100MM (TROCAR) ×1 IMPLANT
TROCAR BALLN 12MMX100 BLUNT (TROCAR) IMPLANT
TROCAR Z-THREAD OPTICAL 5X100M (TROCAR) ×1 IMPLANT
TUBING CONNECTING 10 (TUBING) ×3 IMPLANT
TUBING INSUFFLATION 10FT LAP (TUBING) ×1 IMPLANT

## 2023-08-15 NOTE — Anesthesia Postprocedure Evaluation (Signed)
Anesthesia Post Note  Patient: DUSHAWN PUSEY  Procedure(s) Performed: XI ROBOTIC ASSISTED LOWER ANTERIOR RESECTION, SMALL BOWEL RESECTION, LYSIS OF ADHESIONS, AND INTRAOPERATIVE ASSESSMENT OF PERFUSION USING ICG DYE LAPAROSCOPY DIAGNOSTIC FLEXIBLE SIGMOIDOSCOPY     Patient location during evaluation: PACU Anesthesia Type: General Level of consciousness: awake and alert, patient cooperative and oriented Pain management: pain level controlled Vital Signs Assessment: post-procedure vital signs reviewed and stable Respiratory status: spontaneous breathing, nonlabored ventilation, respiratory function stable and patient connected to nasal cannula oxygen Cardiovascular status: blood pressure returned to baseline and stable Postop Assessment: no apparent nausea or vomiting Anesthetic complications: no   No notable events documented.  Last Vitals:  Vitals:   08/15/23 1615 08/15/23 1630  BP: (!) 147/77 (!) 151/78  Pulse: 64 67  Resp: 14 12  Temp:    SpO2: 96% 97%    Last Pain:  Vitals:   08/15/23 1642  TempSrc:   PainSc: 1                  Erian Lariviere,E. Mohid Furuya

## 2023-08-15 NOTE — H&P (Signed)
CC: Here today for surgery  HPI: Duane [name] is an 62 y.o. male with history of HTN, DM, HLD, whom is seen in the office today as a referral by Dr. Randa Evens for evaluation of colon mass.  CT A/P 05/21/23 1. Sigmoid colon bowel wall thickening may reflect a colonic mass or focal diverticulitis. Multiple rim enhancing fluid collections in the abdomen or pelvis likely reflect abscesses and suggest bowel perforation due to the colonic mass/focal diverticulitis. 2. Multiple loops of dilated small bowel with a possible transition to decompressed small bowel in the lower right abdomen. This may reflect ileus or small-bowel obstruction and is likely related to the multiple abdominal abscesses/intraperitoneal inflammatory process.  CT Guided drain placement 05/22/23 Dr. Derrell Lolling 1. Placement of a 12 French drainage catheter into the low anterior abdominal fluid collection with aspiration of 100 mL foul-smelling purulent fluid which was sent for Gram stain and culture. 2. Placement of a 12 French drainage catheter via a left transgluteal approach into the pelvic cul-de-sac collection with aspiration of an additional 100 mL foul-smelling purulent fluid.  Drain study 06/04/23 1. Minimal residual cavity at the anterior midline pelvic drain. No fistulous communication was seen. Given 25 mL of daily output of cloudy [place] fluid, this drain was left in place. 2. Given only minimal serosanguineous output from the posterior drain and no significant residual cavity or fistulous communication, the drain was removed.  Colonoscopy 06/07/23 - Dr. Leonides Schanz: - Preparation of the colon was fair - The examined portion of the ileum was normal - Diverticulosis in the sigmoid colon, in the descending colon, in the transverse colon and in the ascending colon. - Two 4 to 10 mm polyps in the transverse colon and in the ascending colon, removed with a cold snare. Resected and retrieved. - Two 10 to 15 mm polyps in  the descending colon and in the transverse colon, removed with a hot snare. Resected and retrieved. - One 4 mm polyp in the sigmoid colon, removed with a cold snare. Resected and retrieved. - Likely malignant partially obstructing tumor in the sigmoid colon. Biopsied. Tattooed. - One 20 mm polyp in the rectum, removed with a hot snare. Resected and retrieved. - One 5 mm polyp in the rectum, removed with a cold snare. Resected and retrieved. - Non-bleeding internal hemorrhoids.  PATH 1. Colon, polyp(s), transverse and ascending (3) - TUBULAR ADENOMA, FRAGMENTS.  2. Colon, polyp(s), sigmoid and descending (2) - TUBULOVILLOUS/TUBULAR ADENOMA, FRAGMENTS.  3. 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  NOTE: IT IS NOTED THAT THIS IS SUBMITTED AS A MASS. WHILE THERE IS FOCAL SPLAYING OF SMOOTH MUSCLE DEFINITIVE EVIDENCE OF INVASION INCLUDING DESMOPLASIA OR THE PRESENCE OF SUBMUCOSAL TISSUE IS NOT PRESENT IN THESE SUPERFICIAL BIOPSIES. CLINICAL/ENDOSCOPIC CORRELATION NECESSARY.  4. Rectum, polyp(s), (2) - TUBULOVILLOUS ADENOMA, FRAGMENTS.  NOTE: THE LARGEST FRAGMENT OF TUBULOVILLOUS ADENOMA HAS EXTENSIVE SMOOTH MUSCLE HYPERPLASIA WITH BULBOUS OUTGROWTHS OF THE EPITHELIUM THAT IS CONSIDERED PROLAPSE TYPE EFFECT/PSEUDOINVASION. THERE IS NO EVIDENCE OF INVASION. THIS POLYP HAS A PROMINENT STALK MARGIN WHICH IS NEGATIVE.  CEA 06/10/23: 3.1  He was referred to see me today to consider surgery.  INTERVAL HX CT PET completed and did not demonstrate any evident peritoneal carcinomatosis or FDG avid right lower quadrant soft tissue implants. Case was discussed at multidisciplinary tumor board. There were no visible areas that would be a candidate for sampling. Consensus recommendation was to proceed with attempt at surgery.  Currently, he reports  no complaints at present. He does still have 1 remaining IR drain in place. The output of this  is scant and generally pink/serosanguineous in color. He denies any fever/chills/nausea/vomiting. He is having regular bowel movements. He denies any evident blood in his stool.  He previously smoked but has quit since having been admitted for the above-mentioned problem.  PMH: HTN, DM, HLD  PSH: Denies ever having had any prior abdominal or pelvic surgical history.  FHx: Denies any known family history of colorectal, breast, endometrial or ovarian cancer  Social Hx: Denies use of tobacco/EtOH/illicit drug. He is not currently working but previously worked in a factory for many many years and then in [employer]. He is here today by himself.   He denies any changes in health or health history since we met in the office. No new medications/allergies. He states he is ready for surgery today. He tolerated bowel prep with reported satisfactory result. Denies any complaints today - drain remains in place with stable appearing output  Past Medical History:  Diagnosis Date   Cancer (HCC)    colon   Diabetes mellitus without complication (HCC)    GERD (gastroesophageal reflux disease)    Hypertension     Past Surgical History:  Procedure Laterality Date   COLONOSCOPY     IR RADIOLOGIST EVAL & MGMT  06/03/2023    Family History  Problem Relation Age of Onset   Colon cancer Neg Hx    Colon polyps Neg Hx    Rectal cancer Neg Hx    Stomach cancer Neg Hx    Esophageal cancer Neg Hx     Social:  reports that he has quit smoking. His smoking use included cigarettes. He started smoking about 49 years ago. He has a 49.8 pack-year smoking history. He has never used smokeless tobacco. He reports that he does not currently use alcohol. He reports that he does not use drugs.  Allergies:  Allergies  Allergen Reactions   Codeine Other (See Comments)    "I get jittery."     Medications: I have reviewed the patient's current medications.  Results for orders placed or performed during  the hospital encounter of 08/15/23 (from the past 48 hour(s))  Glucose, capillary     Status: Abnormal   Collection Time: 08/15/23  9:08 AM  Result Value Ref Range   Glucose-Capillary 117 (H) 70 - 99 mg/dL    Comment: Glucose reference range applies only to samples taken after fasting for at least 8 hours.  ABO/Rh     Status: None (Preliminary result)   Collection Time: 08/15/23  9:21 AM  Result Value Ref Range   ABO/RH(D) PENDING     No results found.   PE Blood pressure (!) 142/77, pulse 68, temperature 98.5 F (36.9 C), temperature source Oral, resp. rate 18, height 6\' 2"  (1.88 m), weight 93 kg, SpO2 100%. Constitutional: NAD; conversant Eyes: Moist conjunctiva; no lid lag; anicteric Lungs: Normal respiratory effort CV: RRR GI: Abd soft, NT/ND; IR drain in place Psychiatric: Appropriate affect  Results for orders placed or performed during the hospital encounter of 08/15/23 (from the past 48 hour(s))  Glucose, capillary     Status: Abnormal   Collection Time: 08/15/23  9:08 AM  Result Value Ref Range   Glucose-Capillary 117 (H) 70 - 99 mg/dL    Comment: Glucose reference range applies only to samples taken after fasting for at least 8 hours.  ABO/Rh     Status: None (Preliminary  result)   Collection Time: 08/15/23  9:21 AM  Result Value Ref Range   ABO/RH(D) PENDING     No results found.  A/P: Duane [name] is an 62 y.o. 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 He now has a suspected fistula to his 1 remaining IR drain.  CT PET 06/30/23 -hypermetabolic mass in the proximal sigmoid colon. No evidence of metastatic adenopathy or peritoneal metastases. No liver metastasis or distant metastasis seen.  Case discussed at MDT-consensus recommendation was to proceed with surgery. Interventional radiology reviewed the imaging as well and  did not feel that there is any sort of evidence of persistent right lower quadrant soft tissue implants that would be amenable to any sort of percutaneous biopsy. No FDG avid lesions also decreases index of suspicion for peritoneal carcinomatosis but does not take it off the table.  -Visit scheduled with Dr. Mosetta Putt 07/11/23  -We spent time today reviewing the relevant anatomy physiology the GI tract and pathophysiology of colorectal cancer. He has had perforated colon cancer with abscesses  -He has had a recent colonoscopy since all this occurred and does not have any evidence of endoluminal obstruction.   -The anatomy and physiology of the GI tract was reviewed with the patient. The pathophysiology of colon cancer was discussed as well with associated pictures. -We have discussed various different treatment options going forward including surgery (the most definitive) to address this - diagnostic laparoscopy, possible robotic assisted (versus open) sigmoidectomy with possible ostomy, flexible sigmoidoscopy, intraoperative assessment of perfusion (ICG)  -We discussed that would start off with a diagnostic laparoscopy type approach and evaluate for any evidence of peritoneal carcinomatosis. We discussed that if this were found, potentially aborting the procedure at that juncture.  -We also spent time discussing the potential possibility for an ostomy with this operation and general expectations therein. We discussed that this may in fact be permanent. He expressed clear understanding of this.  -The planned procedures, material risks (including, but not limited to, pain, bleeding, infection, scarring, need for blood transfusion, damage to surrounding structures- blood vessels/nerves/viscus/organs, damage to ureter, urine leak, leak from anastomosis, need for additional procedures, sexual dysfunction, low anterior resection syndrome (LARS) = increased fecal urgency and/or frequency, scenarios where a stoma  may be necessary and where it may be permanent, worsening of pre-existing medical conditions, chronic diarrhea, constipation secondary to narcotic use, hernia, recurrence, pneumonia, heart attack, stroke, death) benefits and alternatives to surgery were discussed at length. The patient's questions were answered to his satisfaction, he voiced understanding and elected to proceed with surgery. Additionally, we discussed typical postoperative expectations and the recovery process.   Marin Olp, MD [facility]

## 2023-08-15 NOTE — Anesthesia Procedure Notes (Signed)
Procedure Name: Intubation Date/Time: 08/15/2023 11:20 AM  Performed by: Laurita Quint, CRNAPre-anesthesia Checklist: Patient identified, Emergency Drugs available, Suction available and Patient being monitored Patient Re-evaluated:Patient Re-evaluated prior to induction Oxygen Delivery Method: Circle System Utilized Preoxygenation: Pre-oxygenation with 100% oxygen Induction Type: IV induction Ventilation: Mask ventilation without difficulty Laryngoscope Size: Miller and 2 Grade View: Grade I Tube type: Oral Tube size: 7.5 mm Number of attempts: 1 Airway Equipment and Method: Stylet Placement Confirmation: ETT inserted through vocal cords under direct vision, positive ETCO2 and breath sounds checked- equal and bilateral Secured at: 23 cm Tube secured with: Tape Dental Injury: Teeth and Oropharynx as per pre-operative assessment  Difficulty Due To: Difficult Airway- due to dentition Comments: Dentition unchanged post intubation

## 2023-08-15 NOTE — Anesthesia Preprocedure Evaluation (Addendum)
Anesthesia Evaluation  Patient identified by MRN, date of birth, ID band Patient awake    Reviewed: Allergy & Precautions, NPO status , Patient's Chart, lab work & pertinent test results  History of Anesthesia Complications Negative for: history of anesthetic complications  Airway Mallampati: I  TM Distance: >3 FB Neck ROM: Full    Dental  (+) Poor Dentition, Loose, Chipped, Missing, Dental Advisory Given   Pulmonary former smoker   breath sounds clear to auscultation       Cardiovascular hypertension, Pt. on medications (-) angina  Rhythm:Regular Rate:Normal     Neuro/Psych negative neurological ROS     GI/Hepatic Neg liver ROS,GERD  Controlled,,Sigmoid colon cancer   Endo/Other  diabetes (glu 117), Oral Hypoglycemic Agents    Renal/GU negative Renal ROS     Musculoskeletal   Abdominal   Peds  Hematology negative hematology ROS (+)   Anesthesia Other Findings   Reproductive/Obstetrics                             Anesthesia Physical Anesthesia Plan  ASA: 2  Anesthesia Plan: General   Post-op Pain Management: Tylenol PO (pre-op)*   Induction: Intravenous  PONV Risk Score and Plan: 2 and Ondansetron and Dexamethasone  Airway Management Planned: Oral ETT  Additional Equipment: None  Intra-op Plan:   Post-operative Plan: Extubation in OR  Informed Consent: I have reviewed the patients History and Physical, chart, labs and discussed the procedure including the risks, benefits and alternatives for the proposed anesthesia with the patient or authorized representative who has indicated his/her understanding and acceptance.     Dental advisory given  Plan Discussed with: CRNA and Surgeon  Anesthesia Plan Comments:         Anesthesia Quick Evaluation

## 2023-08-15 NOTE — Transfer of Care (Signed)
Immediate Anesthesia Transfer of Care Note  Patient: Duane [name]  Procedure(s) Performed: XI ROBOTIC ASSISTED LOWER ANTERIOR RESECTION, LYSIS OF ADHESIONS, AND INTRAOPERATIVE ASSESSMENT OF PERFUSION USING ICG DYE LAPAROSCOPY DIAGNOSTIC FLEXIBLE SIGMOIDOSCOPY  Patient Location: PACU  Anesthesia Type:General  Level of Consciousness: drowsy  Airway & Oxygen Therapy: Patient Spontanous Breathing and Patient connected to face mask oxygen  Post-op Assessment: Report given to RN and Post -op Vital signs reviewed and stable  Post vital signs: stable  Last Vitals:  Vitals Value Taken Time  BP 160/83 08/15/23 1442  Temp    Pulse 84 08/15/23 1444  Resp 13 08/15/23 1444  SpO2 99 % 08/15/23 1444  Vitals shown include unfiled device data.  Last Pain:  Vitals:   08/15/23 0901  TempSrc:   PainSc: 0-No pain         Complications: No notable events documented.

## 2023-08-15 NOTE — Op Note (Signed)
PATIENT: Duane [name]  62 y.o. male  Patient Care Team: [name], NP as PCP - General (Internal Medicine)  PREOP DIAGNOSIS: COLON CANCER  POSTOP DIAGNOSIS: COLON CANCER  PROCEDURE:  Robotic assisted low anterior resection with double stapled colorectal anastomosis Small bowel resection Drainage of intra-abdominal abscess 5 x 5 cm Robotic lysis of adhesions x 100 minutes Intraoperative assessment of perfusion using ICG fluorescence imaging Flexible sigmoidoscopy Bilateral transversus abdominus plane (TAP) blocks  SURGEON: Stephanie Coup. Keilin Gamboa, MD  ASSISTANT:  Karie Soda, MD Gaynelle Adu, MD  ANESTHESIA: General endotracheal  EBL: 150 mL Total I/O In: 1000 [I.V.:1000] Out: 300 [Urine:150; Blood:150]  DRAINS: 19 Fr round blake drain left draining the abscess cavity  SPECIMEN:  Rectosigmoid colon - open end proximal  Small bowel (Mid to distal ileum) Distal donut  COUNTS: Sponge, needle and instrument counts were reported correct x2  FINDINGS: Significant adhesions related to suspected prior inflammatory reaction in the setting of a contained perforation.  Large abscess cavity approximately 5 x 5 cm in size on the anterior abdominal wall with an indwelling percutaneous drain.  The quality of the tissues all around this was however quite healthy.  There was some involved small intestine with this cavity that we ultimately resected for both oncologic purposes in because of the possibility of underlying bowel fistula.  No obvious metastatic disease on visceral parietal peritoneum or liver. A well perfused, tension free, hemostatic, air tight 29 mm EEA colorectal anastomosis fashioned 15 cm from the anal verge by flexible sigmoidoscopy.   NARRATIVE: Informed consent was verified. The patient was taken to the operating room, placed supine on the operating table and SCD's were applied. General endotracheal anesthesia was induced without difficulty.  He was then  positioned in the lithotomy position with Allen stirrups.  Pressure points were evaluated and padded.  A foley catheter was then placed by nursing under sterile conditions. Hair on the abdomen was clipped.  He was secured to the operating table. The abdomen was then prepped and draped in the standard sterile fashion. Surgical timeout was called indicating the correct patient, procedure, positioning and need for preoperative antibiotics.   An OG tube was placed by anesthesia and confirmed to be to suction.  At Palmer's point, a stab incision was created and the Veress needle was introduced into the peritoneal cavity on the first attempt.  Intraperitoneal location was confirmed by the aspiration and saline drop test.  Pneumoperitoneum was established to a maximum pressure of 15 mmHg using CO2.  Following this, the abdomen was marked for planned trocar sites.  Just to the right and cephalad to the umbilicus, an 8 mm incision was created and an 8 mm blunt tipped robotic trocar was cautiously placed into the peritoneal cavity.  The laparoscope was inserted and demonstrated no evidence of trocar site nor Veress needle site complications.  The Veress needle was removed.  Bilateral transversus abdominis plane blocks were then created using a dilute mixture of Exparel with Marcaine.  3 additional 8 mm robotic trochars were placed under direct visualization roughly in a line extending from the right ASIS towards the left upper quadrant. The bladder was inspected and noted to be at/below the pubic symphysis.  Given the appearance with the abscess cavity in the lower abdomen, we opted to proceed with a 12 mm trocar placement in the supraumbilical position.  This was done carefully under direct visualization..  An additional 5 mm assist port was placed in the right lateral abdomen  under direct visualization.  The abdomen was surveyed and there was adhesions of the sigmoid colon to the anterior abdominal wall just above the  bladder as expected.  There is also loops of ileum pulled into this.  Peritoneal surfaces are normal in appearance.  The surface of the liver was also normal in appearance. He was positioned in Trendelenburg with the left side tilted slightly up.  Small bowel was carefully retracted out of the pelvis.  The robot was then docked and I went to the console.  We began with adhesiolysis. Adhesions consisting of small bowel were lysed from the right lower quadrant and across the lower midline.  This was done sharply using scissors.  Next we lysed adhesions between the small bowel mesentery and the sigmoid mesentery.  There is no evident mass involvement of this area or involving these loops of intestine.  There is also a loop of ileum that is densely adherent to the sigmoid colon at the site of the perforation.  We are able to carefully separate this a to facilitate exposure of critical anatomy for the sigmoid colectomy.  There was no clear tumor involvement of this loop of small bowel however.  There is some granulation tissue on the wall of the small bowel at this location.  There is no spillage at this point.  Next, we opted to proceed with resection of the planned sigmoid.  There is a large abscess cavity which began to drain purulent contents consistent with what we saw on his drain.  There is no clear mass extension beyond the level of the colon or into this collection.  It does appear to be cephalad to the bladder but intimately associated with the bladder.  It appears like a large old abscess cavity.  There is no visible tumor burden within it.  It did not appear that this would be resectable with clean margins even if we opted to proceed with this without creating a very large abdominal wall defect which would still carry a very high risk for recurrence if tumor was present.  Contents of the cavity were controlled with our suction irrigator device.  Total time with adhesiolysis is approximately 100  minutes.   Attachments of the sigmoid colon were taken down from the intersigmoid fossa.  The rectosigmoid colon was grasped and elevated anteriorly.  Beginning with a medial to lateral approach, the peritoneum overlying the presacral space was carefully incised.  The TME plane was readily gained working in a plane between the fascia propria of the rectum and the presacral fascia.  Hypogastric nerves were seen going along the the presacral fascia and were protected free of injury.  Working more proximally, the mesorectum and sigmoid mesentery were carefully mobilized off of the peritoneum.  The left ureter was identified and protected free of injury.  The left gonadal vessels were identified and protected.  These were both swept "down."  The superior hemorrhoidal and IMA pedicles were identified. Further mesocolon was mobilized proximally staying in this plane between the retroperitoneum proper and the mesocolon. Attention was then turned to the lateral portion of dissection.  The sigmoid colon was then retracted to the right.  The sigmoid colon was fully mobilized. The descending colon was mobilized by incising the Jammal Sarr line of Toldt.  This was done all the way up to the level of the splenic flexure.  The associated mesocolon was also mobilized medially.  The left ureter again was confirmed to be well away from the vasculature which  had been dissected medially.  The rectosigmoid colon was elevated anteriorly.  There is a mass at the level of the mid sigmoid colon with a tattoo distal to it.  The left ureter was re-identified. The IMA was clear of this. The IMA was then divided with the vessel sealer. The stump was inspected and noted to be completely hemostatic with a good seal.  The mesentery was divided out to the point of planned proximal division > 5 cm proximal to the mass to ensure good blood flow.  Working more distally, the rectum was identified where the tinea had splayed and there were loss of  appendices epiploica.  This also corresponded to a location overlying the sacral promontory.  This area is approximately 5 cm distal to the mass.  Anatomically, this clearly represents the proximal rectum.  The mesentery out to this level was then cleared using the vessel sealer. The distal point of transection on the proximal rectum was identified.   A 60 mm green load robotic stapler was then placed through the 12 mm port and introduced into the peritoneal cavity.  The rectum was divided with two firings of the stapler.  The stump was intact and healthy in appearance.   Attention was turned to performing a perfusion test. ICG was administered by anesthesia and at the level of the cleared mesentery proximally, there was excellent uptake of the tracer.  The rectum was also well perfused in appearance.  There was a visible pulse in the mesentery out to the level of the cleared colon at the level of the proximal sigmoid/descending colon junction.  This colon is also supple and healthy in appearance without any thickening.  This reached into the pelvis without any difficulty and remained in that location without any tension. A locking grasper was then placed on the sigmoid staple line.   Attention was turned to the extracorporeal portion of the procedure.  The robot was undocked.  I scrubbed back in.  Using the 12 mm trocar site, a para-umbilical incision was created and incorporated the fascial opening through the 12 mm port site.  The rectus fascia was incised and then elevated.  The rectus muscle was mobilized free of the overlying fascia.  The peritoneum was incised in the midline well above the location of the bladder.  An Alexis wound protector was placed.  Towels were placed around the field.  First, we brought out the loop of small bowel that was then involved in this abscess cavity.  There is no clear perforation of it but the serosal surface is difficult to evaluate due to the degree of scar tissue  present on the wall.  For these reasons, we opted to proceed with resection of the segment.  Each respective limb is well-perfused in appearance.  Windows were created the mesentery on either side and a 75 mm GIA blue stapler was used to divide each limb of the small bowel.  The intervening mesentery was clamped and divided using her hand-held LigaSure device.  The cut edges inspected and hemostatic.  The specimen is passed off with a stitch orienting the area with some nodularity on the mesentery that appeared more inflammatory than actual cancer.  Attention is directed at creating the small bowel anastomosis.  Orientation is confirmed such a there is no twisting of the bowel.  There is a palpable pulse in the respective limb going out to the staple line.  A enterotomy was created on each respective limb and a 75 mm  GIA blue load stapler was used to create the small bowel anastomosis on the antimesenteric border of each respective limb.  The anastomosis is inspected and hemostatic.  A 3-0 silk sutures placed at the apex securing the anastomosis.  The common enterotomy is then closed using a TA 60 blue load stapler after distracting the anastomotic staple lines.  The corners of the TA staple line are then dunked using 3-0 silk suture.  Hemostasis is verified.  All staple lines were inspected and noted to have well-formed staples.  The mesenteric defect was then closed using 3-0 silk sutures.  This was then placed back into the abdomen.  The divided colon was passed through the wound protector.  The point of proximal division was identified and was again on a healthy segment of supple colon with a palpable pulse in the mesentery. This was pink in color.  A pursestring device was applied.  A 2-0 Prolene on a Keith needle was passed.  The colon was divided and passed off with the open end being proximal.  EEA sizers were then introduced and a 29 mm EEA selected.  "Belt loops" consisting of 3-0 silk were placed  around the pursestring suture line.  The anvil was placed and the pursestring tied.  A small amount of fat was cleared from the planned anastomosis and no diverticula were apparent within this.  This was placed back into the abdomen and a cap placed over the wound protector port site.  Pneumoperitoneum was reestablished.  I then went below to pass the stapler.  My partner remained above.  EEA sizers were cautiously introduced via the anus and advanced under direct visualization.  The stapler was passed and the spike deployed just anterior to the staple line.  The components were then mated.  Orientation was confirmed such that there is no twisting of the colon nor small bowel underneath the mesenteric defect. Care was taken to ensure no other structures were incorporated within this either.  The stapler was then closed, held, and fired. This was then removed. The donuts were inspected and noted to be complete.  The colon proximal to the anastomosis was then gently occluded. The pelvis was filled with sterile irrigation. Under direct visualization, I passed a flexible sigmoidoscope.  The anastomosis was under water.  With good distention of the anastomosis there was no air leak. The anastomosis pink in appearance.  This is located at 15 cm from the anal verge by flexible sigmoidoscopy.  It is hemostatic.  Additionally, looking from above, there is no tension on the colon or mesentery.  Sigmoidoscope was withdrawn.  Irrigation was evacuated from the pelvis.  The abdomen and pelvis are surveyed and noted to be completely hemostatic without any apparent injury.  A 19 French round Blake drain was placed into the abdomen through one of her trocar sites and left draining both the abscess cavity in his pelvis.  This was secured to the skin using a 2-0 Prolene stitch.  Under direct visualization, all trochars are removed.  The Alexis wound protector was removed.  Gowns/gloves are changed and a fresh set of clean instruments  utilized. Additional sterile drapes were placed around the field.   The paraumbilical rectus fascia was then closed using 2 running #1 PDS sutures.  The fascia was then palpated and noted to be completely closed.  Additional anesthetic was infiltrated at this site.  Sponge, needle, and instrument counts were reported correct x2. 4-0 Monocryl subcuticular suture was used to close the  skin of all incision sites.  Dermabond was placed over all incisions.  The skin at his prior IR drain site was also partially enlarged to facilitate ongoing drainage while the wound collapses/heals.   He was then taken out of lithotomy, awakened from anesthesia, extubated, and transferred to a stretcher for transport to PACU in satisfactory condition having tolerated the procedure well.

## 2023-08-15 NOTE — Consult Note (Signed)
WOC marked patient in PAT last week 10/11, will follow up post op for ostomy needs.  Purity Irmen [facility], CNS, [employer] [phone]

## 2023-08-16 ENCOUNTER — Encounter (HOSPITAL_COMMUNITY): Payer: Self-pay | Admitting: Surgery

## 2023-08-16 LAB — CBC
HCT: 37 % — ABNORMAL LOW (ref 39.0–52.0)
Hemoglobin: 12.7 g/dL — ABNORMAL LOW (ref 13.0–17.0)
MCH: 28.7 pg (ref 26.0–34.0)
MCHC: 34.3 g/dL (ref 30.0–36.0)
MCV: 83.5 fL (ref 80.0–100.0)
Platelets: 261 10*3/uL (ref 150–400)
RBC: 4.43 MIL/uL (ref 4.22–5.81)
RDW: 13.9 % (ref 11.5–15.5)
WBC: 9.5 10*3/uL (ref 4.0–10.5)
nRBC: 0 % (ref 0.0–0.2)

## 2023-08-16 LAB — BASIC METABOLIC PANEL
Anion gap: 8 (ref 5–15)
BUN: 8 mg/dL (ref 8–23)
CO2: 24 mmol/L (ref 22–32)
Calcium: 8.4 mg/dL — ABNORMAL LOW (ref 8.9–10.3)
Chloride: 104 mmol/L (ref 98–111)
Creatinine, Ser: 0.93 mg/dL (ref 0.61–1.24)
GFR, Estimated: >60 mL/min (ref >60)
Glucose, Bld: 139 mg/dL — ABNORMAL HIGH (ref 70–99)
Potassium: 3.8 mmol/L (ref 3.5–5.1)
Sodium: 136 mmol/L (ref 135–145)

## 2023-08-16 NOTE — Progress Notes (Signed)
Mobility Specialist - Progress Note   08/16/23 1004  Mobility  Activity Ambulated independently in hallway  Level of Assistance Independent  Assistive Device None  Distance Ambulated (ft) 500 ft  Activity Response Tolerated well  Mobility Referral Yes  $Mobility charge 1 Mobility  Mobility Specialist Start Time (ACUTE ONLY) 0956  Mobility Specialist Stop Time (ACUTE ONLY) 1003  Mobility Specialist Time Calculation (min) (ACUTE ONLY) 7 min   Pt received in bed and agreeable to mobility. No complaints during session. Pt to bed after session with all needs met.    [facility]

## 2023-08-16 NOTE — Progress Notes (Signed)
  Subjective No acute events. Doing quite well. Friend at bedside. No n/v. Tolerating full liquids. Pain well controlled. No flatus/bm yet. Denies distention  Objective: Vital signs in last 24 hours: Temp:  [97.6 F (36.4 C)-98.6 F (37 C)] 98.6 F (37 C) (10/15 0505) Pulse Rate:  [64-89] 66 (10/15 0505) Resp:  [12-18] 17 (10/15 0505) BP: (132-166)/(70-90) 132/78 (10/15 0505) SpO2:  [94 %-100 %] 99 % (10/15 0505) Weight:  [91.8 kg-93 kg] 91.8 kg (10/15 0500) Last BM Date : 08/15/23 (PTA)  Intake/Output from previous day: 10/14 0701 - 10/15 0700 In: 2392.6 [P.O.:600; I.V.:1792.6] Out: 3780 [Urine:3250; Drains:380; Blood:150] Intake/Output this shift: No intake/output data recorded.  Gen: NAD, comfortable CV: RRR Pulm: Normal work of breathing Abd: Soft, NT/ND. Incisions c/d; former IR drain site clean, scant drainage. JP SS Ext: SCDs in place  Lab Results: CBC  Recent Labs    08/16/23 0439  WBC 9.5  HGB 12.7*  HCT 37.0*  PLT 261   BMET Recent Labs    08/16/23 0439  NA 136  K 3.8  CL 104  CO2 24  GLUCOSE 139*  BUN 8  CREATININE 0.93  CALCIUM 8.4*   PT/INR No results for input(s): "LABPROT", "INR" in the last 72 hours. ABG No results for input(s): "PHART", "HCO3" in the last 72 hours.  Invalid input(s): "PCO2", "PO2"  Studies/Results:  Anti-infectives: Anti-infectives (From admission, onward)    Start     Dose/Rate Route Frequency Ordered Stop   08/15/23 1400  neomycin (MYCIFRADIN) tablet 1,000 mg  Status:  Discontinued       Placed in "And" Linked Group   1,000 mg Oral 3 times per day 08/15/23 0841 08/15/23 1702   08/15/23 1400  metroNIDAZOLE (FLAGYL) tablet 1,000 mg  Status:  Discontinued       Placed in "And" Linked Group   1,000 mg Oral 3 times per day 08/15/23 0841 08/15/23 1702   08/15/23 0845  cefoTEtan (CEFOTAN) 2 g in sodium chloride 0.9 % 100 mL IVPB        2 g 200 mL/hr over 30 Minutes Intravenous On call to O.R. 08/15/23 0841 08/15/23  1150        Assessment/Plan: Patient Active Problem List   Diagnosis Date Noted   S/P laparoscopic-assisted sigmoidectomy 08/15/2023   Cancer of sigmoid colon (HCC) 07/10/2023   Dyslipidemia 05/23/2023   HTN (hypertension) 05/21/2023   Diabetes mellitus type 2 in nonobese (HCC) 05/21/2023   Intra-abdominal abscess (HCC) 05/21/2023   s/p Procedure(s): XI ROBOTIC ASSISTED LOWER ANTERIOR RESECTION, SMALL BOWEL RESECTION, LYSIS OF ADHESIONS, AND INTRAOPERATIVE ASSESSMENT OF PERFUSION USING ICG DYE LAPAROSCOPY DIAGNOSTIC FLEXIBLE SIGMOIDOSCOPY 08/15/2023  -Doing quite well -Ambulate 5x/day -Soft diet -D/C IVF, D/C foley -PPX: SQH, SCDs  We spent time reviewing his procedure, findings and plans. Answered all related questions   LOS: 1 day    Marin Olp, MD [facility], [facility]

## 2023-08-17 ENCOUNTER — Other Ambulatory Visit: Payer: Self-pay

## 2023-08-17 LAB — CBC
HCT: 36.6 % — ABNORMAL LOW (ref 39.0–52.0)
Hemoglobin: 12.3 g/dL — ABNORMAL LOW (ref 13.0–17.0)
MCH: 28.1 pg (ref 26.0–34.0)
MCHC: 33.6 g/dL (ref 30.0–36.0)
MCV: 83.8 fL (ref 80.0–100.0)
Platelets: 250 10*3/uL (ref 150–400)
RBC: 4.37 MIL/uL (ref 4.22–5.81)
RDW: 14 % (ref 11.5–15.5)
WBC: 9.6 10*3/uL (ref 4.0–10.5)
nRBC: 0 % (ref 0.0–0.2)

## 2023-08-17 LAB — BASIC METABOLIC PANEL
Anion gap: 7 (ref 5–15)
BUN: 11 mg/dL (ref 8–23)
CO2: 28 mmol/L (ref 22–32)
Calcium: 8.2 mg/dL — ABNORMAL LOW (ref 8.9–10.3)
Chloride: 103 mmol/L (ref 98–111)
Creatinine, Ser: 1.15 mg/dL (ref 0.61–1.24)
GFR, Estimated: >60 mL/min (ref >60)
Glucose, Bld: 121 mg/dL — ABNORMAL HIGH (ref 70–99)
Potassium: 3.6 mmol/L (ref 3.5–5.1)
Sodium: 138 mmol/L (ref 135–145)

## 2023-08-17 MED ORDER — TRAMADOL HCL 50 MG PO TABS
50.0000 mg | ORAL_TABLET | Freq: Four times a day (QID) | ORAL | 0 refills | Status: AC | PRN
Start: 2023-08-17 — End: 2023-08-22

## 2023-08-17 NOTE — Progress Notes (Signed)
  Subjective No acute events. Doing quite well. Friend at bedside. No n/v. Tolerating soft diet. Pain well controlled. Having flatus and Bms. Denies distention  Objective: Vital signs in last 24 hours: Temp:  [98.3 F (36.8 C)-98.7 F (37.1 C)] 98.5 F (36.9 C) (10/16 0600) Pulse Rate:  [58-73] 73 (10/16 0600) Resp:  [18] 18 (10/16 0600) BP: (132-141)/(74-85) 141/75 (10/16 0600) SpO2:  [99 %-100 %] 100 % (10/16 0600) Weight:  [91.8 kg] 91.8 kg (10/16 0500) Last BM Date : 08/16/23  Intake/Output from previous day: 10/15 0701 - 10/16 0700 In: 1080 [P.O.:1080] Out: 1120 [Urine:1050; Drains:70] Intake/Output this shift: No intake/output data recorded.  Gen: NAD, comfortable CV: RRR Pulm: Normal work of breathing Abd: Soft, NT/ND. Incisions c/d; former IR drain site clean, scant drainage. JP SS Ext: SCDs in place  Lab Results: CBC  Recent Labs    08/16/23 0439 08/17/23 0434  WBC 9.5 9.6  HGB 12.7* 12.3*  HCT 37.0* 36.6*  PLT 261 250   BMET Recent Labs    08/16/23 0439 08/17/23 0434  NA 136 138  K 3.8 3.6  CL 104 103  CO2 24 28  GLUCOSE 139* 121*  BUN 8 11  CREATININE 0.93 1.15  CALCIUM 8.4* 8.2*   PT/INR No results for input(s): "LABPROT", "INR" in the last 72 hours. ABG No results for input(s): "PHART", "HCO3" in the last 72 hours.  Invalid input(s): "PCO2", "PO2"  Studies/Results:  Anti-infectives: Anti-infectives (From admission, onward)    Start     Dose/Rate Route Frequency Ordered Stop   08/15/23 1400  neomycin (MYCIFRADIN) tablet 1,000 mg  Status:  Discontinued       Placed in "And" Linked Group   1,000 mg Oral 3 times per day 08/15/23 0841 08/15/23 1702   08/15/23 1400  metroNIDAZOLE (FLAGYL) tablet 1,000 mg  Status:  Discontinued       Placed in "And" Linked Group   1,000 mg Oral 3 times per day 08/15/23 0841 08/15/23 1702   08/15/23 0845  cefoTEtan (CEFOTAN) 2 g in sodium chloride 0.9 % 100 mL IVPB        2 g 200 mL/hr over 30 Minutes  Intravenous On call to O.R. 08/15/23 0841 08/15/23 1150        Assessment/Plan: Patient Active Problem List   Diagnosis Date Noted   S/P laparoscopic-assisted sigmoidectomy 08/15/2023   Cancer of sigmoid colon (HCC) 07/10/2023   Dyslipidemia 05/23/2023   HTN (hypertension) 05/21/2023   Diabetes mellitus type 2 in nonobese (HCC) 05/21/2023   Intra-abdominal abscess (HCC) 05/21/2023   s/p Procedure(s): XI ROBOTIC ASSISTED LOWER ANTERIOR RESECTION, SMALL BOWEL RESECTION, LYSIS OF ADHESIONS, AND INTRAOPERATIVE ASSESSMENT OF PERFUSION USING ICG DYE LAPAROSCOPY DIAGNOSTIC FLEXIBLE SIGMOIDOSCOPY 08/15/2023  -Doing great -Ambulate 5x/day -Soft diet -PPX: SQH, SCDs  We spent time reviewing his procedure, findings and plans. Answered all related questions   LOS: 2 days    Marin Olp, MD [facility], [facility]

## 2023-08-17 NOTE — Progress Notes (Signed)
Patient was given discharge instructions and teaching for JP drain. All questions were answered. Patient was stable for discharge and was taken to the main exit where the security vehicle took him to the bus stop. I accompanied the security to the bus stop.

## 2023-08-17 NOTE — Discharge Instructions (Signed)
POST OP INSTRUCTIONS AFTER COLON SURGERY  DIET: Be sure to include lots of fluids daily to stay hydrated - 64oz of water per day (8, 8 oz glasses).  Avoid fast food or heavy meals for the first couple of weeks as your are more likely to get nauseated. Avoid raw/uncooked fruits or vegetables for the first 4 weeks (its ok to have these if they are blended into smoothie form). If you have fruits/vegetables, make sure they are cooked until soft enough to mash on the roof of your mouth and chew your food well. Otherwise, diet as tolerated.  Take your usually prescribed home medications unless otherwise directed.  PAIN CONTROL: Pain is best controlled by a usual combination of three different methods TOGETHER: Ice/Heat Over the counter pain medication Prescription pain medication Most patients will experience some swelling and bruising around the surgical site.  Ice packs or heating pads (30-60 minutes up to 6 times a day) will help. Some people prefer to use ice alone, heat alone, alternating between ice & heat.  Experiment to what works for you.  Swelling and bruising can take several weeks to resolve.   It is helpful to take an over-the-counter pain medication regularly for the first few weeks: Ibuprofen (Motrin/Advil) - 200mg  tabs - take 3 tabs (600mg ) every 6 hours as needed for pain (unless you have been directed previously to avoid NSAIDs/ibuprofen) Acetaminophen (Tylenol) - you may take 650mg  every 6 hours as needed. You can take this with motrin as they act differently on the body. If you are taking a narcotic pain medication that has acetaminophen in it, do not take over the counter tylenol at the same time. NOTE: You may take both of these medications together - most patients  find it most helpful when alternating between the two (i.e. Ibuprofen at 6am, tylenol at 9am, ibuprofen at 12pm ..Marland Kitchen) A  prescription for pain medication should be given to you upon discharge.  Take your pain medication as  prescribed if your pain is not adequatly controlled with the over-the-counter pain reliefs mentioned above.  Avoid getting constipated.  Between the surgery and the pain medications, it is common to experience some constipation.  Increasing fluid intake and taking a fiber supplement (such as Metamucil, Citrucel, FiberCon, MiraLax, etc) 1-2 times a day regularly will usually help prevent this problem from occurring.  A mild laxative (prune juice, Milk of Magnesia, MiraLax, etc) should be taken according to package directions if there are no bowel movements after 48 hours.    Dressing: Your incisions are covered in Dermabond which is like sterile superglue for the skin. This will come off on it's own in a couple weeks. It is waterproof and you may bathe normally starting the day after your surgery in a shower. Avoid baths/pools/lakes/oceans until your wounds have fully healed. Drain: Empty and record the drain daily and record the volume of output (in milliliters) and color (clear, tan, yellow, pink/koolaid, brown). Bring the log of your drain outputs to your follow-up. A nurse visit is being arranged for next week for possible drain removal. In the meantime, keep the drain site clean much like when you had your drain before surgery.  ACTIVITIES as tolerated:   Avoid heavy lifting (>10lbs or 1 gallon of milk) for the next 6 weeks. You may resume regular daily activities as tolerated--such as daily self-care, walking, climbing stairs--gradually increasing activities as tolerated.  If you can walk 30 minutes without difficulty, it is safe to try more intense  activity such as jogging, treadmill, bicycling, low-impact aerobics.  DO NOT PUSH THROUGH PAIN.  Let pain be your guide: If it hurts to do something, don't do it. You may drive when you are no longer taking prescription pain medication, you can comfortably wear a seatbelt, and you can safely maneuver your car and apply brakes.  FOLLOW UP in our  office Please call [facility] at [phone] to set up an appointment to see your surgeon in the office for a follow-up appointment approximately 2 weeks after your surgery. Make sure that you call for this appointment the day you arrive home to insure a convenient appointment time.  9. If you have disability or family leave forms that need to be completed, you may have them completed by your primary care physician's office; for return to work instructions, please ask our office staff and they will be happy to assist you in obtaining this documentation   When to call us [phone]: Poor pain control Reactions / problems with new medications (rash/itching, etc)  Fever over 101.5 F (38.5 C) Inability to urinate Nausea/vomiting Worsening swelling or bruising Continued bleeding from incision. Increased pain, redness, or drainage from the incision  The clinic staff is available to answer your questions during regular business hours (8:30am-5pm).  Please don't hesitate to call and ask to speak to one of our nurses for clinical concerns.   A surgeon from [facility] is always on call at the hospitals   If you have a medical emergency, go to the nearest emergency room or call 911.  [facility] [address], [address], [place], [place]  [address] MAIN: [phone] FAX: [phone] [email or web address]

## 2023-08-17 NOTE — Progress Notes (Signed)
   08/17/23 1013  TOC Brief Assessment  Insurance and Status Reviewed  Patient has primary care physician Yes  Home environment has been reviewed home with sister  Prior level of function: independent  Prior/Current Home Services No current home services  Social Determinants of Health Reivew SDOH reviewed no interventions necessary  Readmission risk has been reviewed Yes  Transition of care needs no transition of care needs at this time

## 2023-08-17 NOTE — Discharge Summary (Signed)
Patient ID: Duane [name] MRN: [id number] DOB/AGE: 02/07/1961 62 y.o.  Admit date: 08/15/2023 Discharge date: 08/17/2023  Discharge Diagnoses Patient Active Problem List   Diagnosis Date Noted   S/P laparoscopic-assisted sigmoidectomy 08/15/2023   Cancer of sigmoid colon (HCC) 07/10/2023   Dyslipidemia 05/23/2023   HTN (hypertension) 05/21/2023   Diabetes mellitus type 2 in nonobese (HCC) 05/21/2023   Intra-abdominal abscess (HCC) 05/21/2023     Procedures OR 08/15/23 Robotic assisted low anterior resection with double stapled colorectal anastomosis Small bowel resection Drainage of intra-abdominal abscess 5 x 5 cm Robotic lysis of adhesions x 100 minutes Intraoperative assessment of perfusion using ICG fluorescence imaging Flexible sigmoidoscopy Bilateral transversus abdominus plane (TAP) blocks  Hospital Course: He recovered quite well and expeditiously following surgery. His diet was gradually advanced which he tolerated well. He was mobilizing well on his own. Pain well controlled. He began having spontaneous bowel function including flatus and stool. On 08/17/23 he was comfortable with and stable for discharge home. Postoperative expectations were reviewed. Questions were answered to his satisfaction. Things to watch out for and reasons to call were reviewed. Final pathology pending. Follow-up for drain removal was organized by our clinic for next week.    Allergies as of 08/17/2023       Reactions   Codeine Other (See Comments)   "I get jittery."         Medication List     TAKE these medications    amLODipine 10 MG tablet Commonly known as: NORVASC Take 1 tablet (10 mg total) by mouth daily.   metFORMIN 500 MG tablet Commonly known as: GLUCOPHAGE Take 1 tablet (500 mg total) by mouth 2 (two) times daily with a meal.   pravastatin 20 MG tablet Commonly known as: PRAVACHOL Take 1 tablet by mouth once daily   traMADol 50 MG tablet Commonly known as:  Ultram Take 1 tablet (50 mg total) by mouth every 6 (six) hours as needed for up to 5 days (postop pain not controlled with tylenol/ibuprofen first).          Follow-up Information     Andria Meuse, MD Follow up on 09/08/2023.   Specialties: General Surgery, Colon and Rectal Surgery Why: Please arrive by 1:45 pm Contact information: [address] [place] [address] [phone]         [facility], [place]. Schedule an appointment as soon as possible for a visit in 1 week(s).   Specialty: General Surgery Why: For possible drain removal Contact information: [address] [place] [address] [phone]                Stephanie Coup. Cliffton Asters, M.D. [facility]

## 2023-08-18 ENCOUNTER — Telehealth: Payer: Self-pay

## 2023-08-18 NOTE — Transitions of Care (Post Inpatient/ED Visit) (Signed)
08/18/2023  Name: Duane [name] MRN: [id number] DOB: 02/05/1961  Today's TOC FU Call Status: Today's TOC FU Call Status:: Unsuccessful Call (1st Attempt) Unsuccessful Call (1st Attempt) Date: 08/18/23  Attempted to reach the patient regarding the most recent Inpatient/ED visit. Left VM with RN CM CB number.   Follow Up Plan: Additional outreach attempts will be made to reach the patient to complete the Transitions of Care (Post Inpatient/ED visit) call.   Gabriel Cirri RN MSN  [place]  [facility],  Email: [email or web address] Direct Dial: [phone]

## 2023-08-22 ENCOUNTER — Telehealth: Payer: Self-pay

## 2023-08-22 NOTE — Transitions of Care (Post Inpatient/ED Visit) (Signed)
08/22/2023  Name: Duane [name] MRN: [id number] DOB: 1961/08/05  Today's TOC FU Call Status: Today's TOC FU Call Status:: Unsuccessful Call (3rd Attempt) Unsuccessful Call (3rd Attempt) Date: 08/22/23  Attempted to reach the patient regarding the most recent Inpatient/ED visit.  Follow Up Plan: No further outreach attempts will be made at this time. We have been unable to contact the patient.  Alyse Low, RN, BA, [facility], CRRN [facility] Coordinator, Transition of Care Ph # [phone]

## 2023-08-22 NOTE — Transitions of Care (Post Inpatient/ED Visit) (Signed)
08/22/2023  Name: Duane [name] MRN: [id number] DOB: [birth year]  Today's TOC FU Call Status: Today's TOC FU Call Status:: Unsuccessful Call (2nd Attempt) Unsuccessful Call (2nd Attempt) Date: 08/22/23  Attempted to reach the patient regarding the most recent Inpatient/ED visit.  Follow Up Plan: Additional outreach attempts will be made to reach the patient to complete the Transitions of Care (Post Inpatient/ED visit) call.   Alyse Low, RN, BA, [facility], CRRN [facility] Coordinator, Transition of Care Ph # [phone]

## 2023-08-30 LAB — SURGICAL PATHOLOGY

## 2023-08-30 NOTE — Assessment & Plan Note (Addendum)
pT4bN0M0, MMR normal, G2 -Patient presented with perforated sigmoid colon and a contained abscess required IR drainage tube placement. -He underwent left hemicolectomy and partial small bowel resection.  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  All surgical margins were negative. -Due to the high risk of recurrence given T4b lesion and bowel perforation, I recommend adjuvant chemotherapy FOLFOX or CAPOX for 6 months if he can tolerate.

## 2023-08-31 ENCOUNTER — Encounter: Payer: Self-pay | Admitting: Hematology

## 2023-08-31 ENCOUNTER — Telehealth: Payer: Self-pay | Admitting: Pharmacist

## 2023-08-31 ENCOUNTER — Inpatient Hospital Stay: Payer: Managed Care, Other (non HMO) | Attending: Hematology

## 2023-08-31 ENCOUNTER — Other Ambulatory Visit (HOSPITAL_COMMUNITY): Payer: Self-pay

## 2023-08-31 ENCOUNTER — Telehealth: Payer: Self-pay | Admitting: Pharmacy Technician

## 2023-08-31 ENCOUNTER — Inpatient Hospital Stay (HOSPITAL_BASED_OUTPATIENT_CLINIC_OR_DEPARTMENT_OTHER): Payer: Managed Care, Other (non HMO) | Admitting: Hematology

## 2023-08-31 VITALS — BP 141/75 | HR 59 | Temp 98.1°F | Resp 18 | Ht 74.0 in | Wt 203.3 lb

## 2023-08-31 DIAGNOSIS — C187 Malignant neoplasm of sigmoid colon: Secondary | ICD-10-CM

## 2023-08-31 DIAGNOSIS — Z79899 Other long term (current) drug therapy: Secondary | ICD-10-CM | POA: Diagnosis not present

## 2023-08-31 DIAGNOSIS — D500 Iron deficiency anemia secondary to blood loss (chronic): Secondary | ICD-10-CM

## 2023-08-31 LAB — CBC WITH DIFFERENTIAL/PLATELET
Abs Immature Granulocytes: 0.02 10*3/uL (ref 0.00–0.07)
Basophils Absolute: 0 10*3/uL (ref 0.0–0.1)
Basophils Relative: 1 %
Eosinophils Absolute: 0.1 10*3/uL (ref 0.0–0.5)
Eosinophils Relative: 2 %
HCT: 37.4 % — ABNORMAL LOW (ref 39.0–52.0)
Hemoglobin: 12.9 g/dL — ABNORMAL LOW (ref 13.0–17.0)
Immature Granulocytes: 0 %
Lymphocytes Relative: 33 %
Lymphs Abs: 1.8 10*3/uL (ref 0.7–4.0)
MCH: 28.2 pg (ref 26.0–34.0)
MCHC: 34.5 g/dL (ref 30.0–36.0)
MCV: 81.8 fL (ref 80.0–100.0)
Monocytes Absolute: 0.4 10*3/uL (ref 0.1–1.0)
Monocytes Relative: 7 %
Neutro Abs: 3.2 10*3/uL (ref 1.7–7.7)
Neutrophils Relative %: 57 %
Platelets: 296 10*3/uL (ref 150–400)
RBC: 4.57 MIL/uL (ref 4.22–5.81)
RDW: 14.1 % (ref 11.5–15.5)
WBC: 5.6 10*3/uL (ref 4.0–10.5)
nRBC: 0 % (ref 0.0–0.2)

## 2023-08-31 LAB — COMPREHENSIVE METABOLIC PANEL
ALT: 14 U/L (ref 0–44)
AST: 14 U/L — ABNORMAL LOW (ref 15–41)
Albumin: 3.9 g/dL (ref 3.5–5.0)
Alkaline Phosphatase: 50 U/L (ref 38–126)
Anion gap: 3 — ABNORMAL LOW (ref 5–15)
BUN: 8 mg/dL (ref 8–23)
CO2: 30 mmol/L (ref 22–32)
Calcium: 9.5 mg/dL (ref 8.9–10.3)
Chloride: 107 mmol/L (ref 98–111)
Creatinine, Ser: 1.07 mg/dL (ref 0.61–1.24)
GFR, Estimated: >60 mL/min (ref >60)
Glucose, Bld: 101 mg/dL — ABNORMAL HIGH (ref 70–99)
Potassium: 4.3 mmol/L (ref 3.5–5.1)
Sodium: 140 mmol/L (ref 135–145)
Total Bilirubin: 0.9 mg/dL (ref 0.3–1.2)
Total Protein: 7.5 g/dL (ref 6.5–8.1)

## 2023-08-31 LAB — CEA (IN HOUSE-CHCC): CEA (CHCC-In House): 1.72 ng/mL (ref 0.00–5.00)

## 2023-08-31 LAB — FERRITIN: Ferritin: 12 ng/mL — ABNORMAL LOW (ref 24–336)

## 2023-08-31 MED ORDER — CAPECITABINE 500 MG PO TABS: ORAL_TABLET | ORAL | 0 refills | Status: DC

## 2023-08-31 MED ORDER — PROCHLORPERAZINE MALEATE 10 MG PO TABS: 10.0000 mg | ORAL_TABLET | Freq: Four times a day (QID) | ORAL | 1 refills | Status: AC | PRN

## 2023-08-31 MED ORDER — ONDANSETRON HCL 8 MG PO TABS: 8.0000 mg | ORAL_TABLET | Freq: Three times a day (TID) | ORAL | 1 refills | Status: AC | PRN

## 2023-08-31 MED ORDER — LIDOCAINE-PRILOCAINE 2.5-2.5 % EX CREA: TOPICAL_CREAM | CUTANEOUS | 3 refills | Status: AC

## 2023-08-31 NOTE — Progress Notes (Signed)
[facility]   Telephone:(336) [phone] Fax:(336) [phone]   Clinic Follow up Note   Patient Care Team: [name], NP as PCP - General (Internal Medicine)  Date of Service:  08/31/2023  CHIEF COMPLAINT: f/u of colon cancer  CURRENT THERAPY:  Adjuvant chemo CapeOx  Oncology History   Cancer of sigmoid colon (HCC) pT4bN0M0, MMR normal, G2 -Patient presented with perforated sigmoid colon and a contained abscess required IR drainage tube placement. -He underwent left hemicolectomy and partial small bowel resection.  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  All surgical margins were negative. -Due to the high risk of recurrence given T4b lesion and bowel perforation, I recommend adjuvant chemotherapy FOLFOX or CAPOX for 6 months if he can tolerate.     Assessment and Plan    Colon Cancer (Stage IIC) Post-operative status after colon resection for a large, invasive tumor with direct invasion to the small intestine with perforation. High risk of recurrence due to tumor characteristics. No lymph node involvement. -I strongly recommend adjuvant chemotherapy to reduce risk of recurrence.  I discussed option of FOLFOX and CapeOx.  Patient prefers oral chemotherapy regimen (every three weeks with IV infusion oxaliplatin and oral medication capecitabine for two weeks, then one week off). -Port placement by Dr. Cliffton Asters for chemotherapy administration. -Start chemotherapy in approximately two weeks. -Schedule chemotherapy class for patient education. -Prescribe anti-nausea medication to be filled at [facility] pharmacy. -Schedule follow-up appointment in one week after first chemotherapy treatment. --I discussed the risk of cancer recurrence in the future. I discussed the surveillance plan, which is a physical exam and lab test (including CBC, CMP and CEA) every 3 months for the first 2  years, then every 6-12 months, colonoscopy in one year, and surveilliance CT scan every 6-12 month for up to 5 year.    Post-operative Recovery Good appetite and energy levels. No lifting restrictions. Stable weight. Surgical site healing well. -Continue current care and monitor recovery. -Encourage patient to eat well and stay active in the next two weeks before chemotherapy starts.     Plan -I recommend adjuvant chemotherapy CapeOx, plan to start in 2 weeks, patient agrees. -Chemo class -Port placement by Dr. Zenda Alpers IR -Follow-up with for cycle chemo     SUMMARY OF ONCOLOGIC HISTORY: Oncology History  Cancer of sigmoid colon (HCC)  05/21/2023 Imaging   CT ABDOMEN PELVIS W CONTRAST   IMPRESSION: 1. Sigmoid colon bowel wall thickening may reflect a colonic mass or focal diverticulitis. Multiple rim enhancing fluid collections in the abdomen or pelvis likely reflect abscesses and suggest bowel perforation due to the colonic mass/focal diverticulitis. 2. Multiple loops of dilated small bowel with a possible transition to decompressed small bowel in the lower right abdomen. This may reflect ileus or small-bowel obstruction and is likely related to the multiple abdominal abscesses/intraperitoneal inflammatory process.   06/03/2023 Imaging   CT ABDOMEN PELVIS W CONTRAST    IMPRESSION: 1. Interval placement of midline anterior and left transgluteal approach pelvic drains. The abscesses have resolved. 2. Masslike thickening of the sigmoid colon remains highly suspicious for primary neoplasm. 3. Soft tissue nodular implant in the right lower quadrant measuring 1.5 cm is suspicious for metastatic implant.     06/08/2023 Pathology Results   Diagnosis 1. Colon, polyp(s), transverse and ascending (3) - TUBULAR ADENOMA, FRAGMENTS. 2. Colon, polyp(s), sigmoid and descending (2) - TUBULOVILLOUS/TUBULAR ADENOMA, FRAGMENTS. 3. Sigmoid Colon Biopsy,  mass - SUPERFICIAL FRAGMENTS OF  TUBULOVILLOUS ADENOMA WITH HIGH-GRADE DYSPLASIA. NOTE: IT IS NOTED THAT THIS IS SUBMITTED AS A MASS. WHILE THERE IS FOCAL SPLAYING OF SMOOTH MUSCLE DEFINITIVE EVIDENCE OF INVASION INCLUDING DESMOPLASIA OR THE PRESENCE OF SUBMUCOSAL TISSUE IS NOT PRESENT IN THESE SUPERFICIAL BIOPSIES. CLINICAL/ENDOSCOPIC CORRELATION NECESSARY. 4. Rectum, polyp(s), (2) - TUBULOVILLOUS ADENOMA, FRAGMENTS.   06/30/2023 PET scan   NM PET IMAGE initial (PI) SKULL BASE TO THIGH  IMPRESSION: 1. Hypermetabolic mass in the proximal sigmoid colon consistent with primary colorectal carcinoma. 2. No evidence of metastatic adenopathy or peritoneal metastasis. No liver metastasis or distant metastasis   07/10/2023 Initial Diagnosis   Cancer of sigmoid colon [facility])    Imaging     08/15/2023 Cancer Staging   Staging form: Colon and Rectum, AJCC 8th Edition - Pathologic stage from 08/15/2023: Stage IIC (pT4b, pN0, cM0) - Signed by Malachy Mood, MD on 08/30/2023 Total positive nodes: 0 Histologic grading system: 4 grade system Histologic grade (G): G2 Residual tumor (R): R0 - None   09/14/2023 -  Chemotherapy   Patient is on Treatment Plan : COLORECTAL Xelox (Capeox)(130/850) q21d        Discussed the use of AI scribe software for clinical note transcription with the patient, who gave verbal consent to proceed.  History of Present Illness   A 62 year old gentleman presents for a follow-up visit after colon cancer surgery. The patient underwent colon surgery two weeks ago and had a drain removed last week. The patient reports that the surgical incision is healing well. The patient's appetite is good, but he often needs to use the bathroom after eating. Despite the physical limitations due to recent surgery, the patient is able to do light work without any problems. The patient's weight has remained stable since the last visit. The patient's colon cancer was completely removed during surgery. However, the cancer had  started in the sigmoid colon and had invaded the small intestine, causing a perforation. This has significantly increased the patient's risk of cancer recurrence.         All other systems were reviewed with the patient and are negative.  MEDICAL HISTORY:  Past Medical History:  Diagnosis Date   Cancer (HCC)    colon   Diabetes mellitus without complication (HCC)    GERD (gastroesophageal reflux disease)    Hypertension     SURGICAL HISTORY: Past Surgical History:  Procedure Laterality Date   COLONOSCOPY     FLEXIBLE SIGMOIDOSCOPY N/A 08/15/2023   Procedure: FLEXIBLE SIGMOIDOSCOPY;  Surgeon: Andria Meuse, MD;  Location: [facility];  Service: General;  Laterality: N/A;   IR RADIOLOGIST EVAL & MGMT  06/03/2023   LAPAROSCOPY N/A 08/15/2023   Procedure: LAPAROSCOPY DIAGNOSTIC;  Surgeon: Andria Meuse, MD;  Location: [facility];  Service: General;  Laterality: N/A;   XI ROBOTIC ASSISTED LOWER ANTERIOR RESECTION N/A 08/15/2023   Procedure: XI ROBOTIC ASSISTED LOWER ANTERIOR RESECTION, SMALL BOWEL RESECTION, LYSIS OF ADHESIONS, AND INTRAOPERATIVE ASSESSMENT OF PERFUSION USING ICG DYE;  Surgeon: Andria Meuse, MD;  Location: [facility];  Service: General;  Laterality: N/A;    I have reviewed the social history and family history with the patient and they are unchanged from previous note.  ALLERGIES:  is allergic to codeine.  MEDICATIONS:  Current Outpatient Medications  Medication Sig Dispense Refill   capecitabine (XELODA) 500 MG tablet Take 3 tabs in morning and 4 tabs in evening, for 14 days then off for 7 days. Take after  meals. 98 tablet 0   amLODipine (NORVASC) 10 MG tablet Take 1 tablet (10 mg total) by mouth daily. 90 tablet 1   lidocaine-prilocaine (EMLA) cream Apply to affected area once 30 g 3   metFORMIN (GLUCOPHAGE) 500 MG tablet Take 1 tablet (500 mg total) by mouth 2 (two) times daily with a meal. 180 tablet 1   ondansetron (ZOFRAN) 8 MG tablet Take 1  tablet (8 mg total) by mouth every 8 (eight) hours as needed for nausea or vomiting. Start on the third day after chemotherapy. 30 tablet 1   pravastatin (PRAVACHOL) 20 MG tablet Take 1 tablet by mouth once daily 90 tablet 0   prochlorperazine (COMPAZINE) 10 MG tablet Take 1 tablet (10 mg total) by mouth every 6 (six) hours as needed for nausea or vomiting. 30 tablet 1   No current facility-administered medications for this visit.    PHYSICAL EXAMINATION: ECOG PERFORMANCE STATUS: 1 - Symptomatic but completely ambulatory  Vitals:   08/31/23 1108  BP: (!) 141/75  Pulse: (!) 59  Resp: 18  Temp: 98.1 F (36.7 C)  SpO2: 100%   Wt Readings from Last 3 Encounters:  08/31/23 203 lb 4.8 oz (92.2 kg)  08/17/23 202 lb 6.1 oz (91.8 kg)  08/12/23 205 lb (93 kg)     GENERAL:alert, no distress and comfortable SKIN: skin color, texture, turgor are normal, no rashes or significant lesions EYES: normal, Conjunctiva are pink and non-injected, sclera clear NECK: supple, thyroid normal size, non-tender, without nodularity LYMPH:  no palpable lymphadenopathy in the cervical, axillary  LUNGS: clear to auscultation and percussion with normal breathing effort HEART: regular rate & rhythm and no murmurs and no lower extremity edema ABDOMEN:abdomen soft, non-tender and normal bowel sounds, midline incision has healed well. Musculoskeletal:no cyanosis of digits and no clubbing  NEURO: alert & oriented x 3 with fluent speech, no focal motor/sensory deficits    LABORATORY DATA:  I have reviewed the data as listed    Latest Ref Rng & Units 08/31/2023   10:35 AM 08/17/2023    4:34 AM 08/16/2023    4:39 AM  CBC  WBC 4.0 - 10.5 K/uL 5.6  9.6  9.5   Hemoglobin 13.0 - 17.0 g/dL 82.9  56.2  13.0   Hematocrit 39.0 - 52.0 % 37.4  36.6  37.0   Platelets 150 - 400 K/uL 296  250  261         Latest Ref Rng & Units 08/31/2023   10:35 AM 08/17/2023    4:34 AM 08/16/2023    4:39 AM  CMP  Glucose 70 -  99 mg/dL 865  784  696   BUN 8 - 23 mg/dL 8  11  8    Creatinine 0.61 - 1.24 mg/dL 2.95  2.84  1.32   Sodium 135 - 145 mmol/L 140  138  136   Potassium 3.5 - 5.1 mmol/L 4.3  3.6  3.8   Chloride 98 - 111 mmol/L 107  103  104   CO2 22 - 32 mmol/L 30  28  24    Calcium 8.9 - 10.3 mg/dL 9.5  8.2  8.4   Total Protein 6.5 - 8.1 g/dL 7.5     Total Bilirubin 0.3 - 1.2 mg/dL 0.9     Alkaline Phos 38 - 126 U/L 50     AST 15 - 41 U/L 14     ALT 0 - 44 U/L 14         RADIOGRAPHIC  STUDIES: I have personally reviewed the radiological images as listed and agreed with the findings in the report. No results found.    Orders Placed This Encounter  Procedures   Consent Attestation for Oncology Treatment    Order Specific Question:   The patient is informed of risks, benefits, side-effects of the prescribed oncology treatment. Potential short term and long term side effects and response rates discussed. After a long discussion, the patient made informed decision to proceed.    Answer:   Yes   CBC with Differential (Cancer Center Only)    Standing Status:   Future    Standing Expiration Date:   09/13/2024   CMP (Cancer Center only)    Standing Status:   Future    Standing Expiration Date:   09/13/2024   CBC with Differential (Cancer Center Only)    Standing Status:   Future    Standing Expiration Date:   10/04/2024   CMP (Cancer Center only)    Standing Status:   Future    Standing Expiration Date:   10/04/2024   [facility] PHYSICIAN COMMUNICATION 1    0 Number of doses of oxaliplatin received at [facility] or outside facility. signature of Provider. If patient has received greater than 5 doses of oxaliplatin, the following pre-medications should be ordered: dexamethasone, diphenhydramine, and formulary histamine H2 antagonist. If patient cannot tolerate oral histamine H2 antagonist, IV may be given.   All questions were answered. The patient knows to call the clinic with any problems, questions or  concerns. No barriers to learning was detected. The total time spent in the appointment was 40 minutes.     Malachy Mood, MD 08/31/2023

## 2023-08-31 NOTE — Telephone Encounter (Signed)
Oral Oncology Patient Advocate Encounter  Prior Authorization for capecitabine has been approved.    PA# [id number] Effective dates: 08/31/23 through 08/30/24  Patients co-pay is $0.    Jinger Neighbors, CPhT-Adv Oncology Pharmacy Patient Advocate [facility] Direct Number: [phone]  Fax: [phone]

## 2023-08-31 NOTE — Telephone Encounter (Signed)
Oral Oncology Patient Advocate Encounter   Received notification that prior authorization for capecitabine is required.   PA submitted on 08/31/23 PA submitted to [facility] via phone [phone]  Status is pending     Jinger Neighbors, CPhT-Adv Oncology Pharmacy Patient Advocate [facility] Direct Number: [phone]  Fax: [phone]

## 2023-08-31 NOTE — Progress Notes (Signed)
START ON PATHWAY REGIMEN - Colorectal     A cycle is every 21 days:     Capecitabine      Oxaliplatin   **Always confirm dose/schedule in your pharmacy ordering system**  Patient Characteristics: Postoperative without Neoadjuvant Therapy, M0 (Pathologic Staging), Colon, Stage II, MSS/pMMR, Stage IIB/C Tumor Location: Colon Therapeutic Status: Postoperative without Neoadjuvant Therapy, M0 (Pathologic Staging) AJCC M Category: cM0 AJCC T Category: pT4b AJCC N Category: pN0 AJCC 8 Stage Grouping: IIC Microsatellite/Mismatch Repair Status: MSS/pMMR Intent of Therapy: Curative Intent, Discussed with Patient

## 2023-09-01 ENCOUNTER — Other Ambulatory Visit: Payer: Self-pay | Admitting: *Deleted

## 2023-09-01 ENCOUNTER — Other Ambulatory Visit: Payer: Self-pay

## 2023-09-01 MED ORDER — CAPECITABINE 500 MG PO TABS
ORAL_TABLET | ORAL | 0 refills | Status: DC
  Filled 2023-09-05: qty 98, 21d supply, fill #0

## 2023-09-01 NOTE — Telephone Encounter (Signed)
Oral Oncology Pharmacist Encounter  Received new prescription for Xeloda (capecitabine) for the treatment of stage IIC colon cancer in conjunction with oxaliplatin, planned duration ~4 cycles.  CBC w/ Diff and CMP from 08/31/23 assessed, no relevant lab abnormalities requiring baseline dose adjustment required at this time. Prescription dose and frequency assessed for appropriateness.  Current medication list in Epic reviewed, DDIs with Xeloda identified: Category C DDI between Xeloda and Ondansetron due to risk of Qtc prolongation with fluorouracil products. Noted patient only taking PRN and PO route, risk higher with IV administration. No change in therapy warranted at this time.   Evaluated chart and no patient barriers to medication adherence noted.   Patient agreement for treatment documented in MD note on 08/31/23.  Prescription has been e-scribed to the [facility] for benefits analysis and approval.  Oral Oncology Clinic will continue to follow for insurance authorization, copayment issues, initial counseling and start date.  Lenord Carbo, PharmD, BCPS, BCOP Hematology/Oncology Clinical Pharmacist [facility] Oral Chemotherapy Navigation Clinics [phone] 09/01/2023 9:20 AM

## 2023-09-01 NOTE — Progress Notes (Signed)
The proposed treatment discussed in conference is for discussion purpose only and is not a binding recommendation.  The patients have not been physically examined, or presented with their treatment options.  Therefore, final treatment plans cannot be decided.  

## 2023-09-02 ENCOUNTER — Telehealth: Payer: Self-pay

## 2023-09-02 ENCOUNTER — Encounter: Payer: Self-pay | Admitting: Hematology

## 2023-09-02 LAB — MOLECULAR PATHOLOGY

## 2023-09-02 NOTE — Telephone Encounter (Signed)
-----   Message from Malachy Mood sent at 09/01/2023  8:35 PM EDT ----- Please let pt know his iron level is low and I recommend OTC oral iron such as ferrous sulfate once daily, thanks   Malachy Mood

## 2023-09-02 NOTE — Telephone Encounter (Signed)
Pt advised with VU and agreed to this plan of care.

## 2023-09-02 NOTE — Telephone Encounter (Signed)
Left VM with pt.

## 2023-09-05 ENCOUNTER — Other Ambulatory Visit: Payer: Self-pay

## 2023-09-05 ENCOUNTER — Other Ambulatory Visit: Payer: Self-pay | Admitting: Pharmacy Technician

## 2023-09-05 ENCOUNTER — Other Ambulatory Visit (HOSPITAL_COMMUNITY): Payer: Self-pay

## 2023-09-05 ENCOUNTER — Encounter (HOSPITAL_BASED_OUTPATIENT_CLINIC_OR_DEPARTMENT_OTHER): Payer: Self-pay | Admitting: Surgery

## 2023-09-05 NOTE — Progress Notes (Signed)
Specialty Pharmacy Initial Fill Coordination Note  Duane [name] is a 62 y.o. male contacted today regarding refills of specialty medication(s) Capecitabine .  Patient requested Delivery  on 09/06/23  to verified address [address] [place], [place] [address]   Medication will be filled on 09/05/23.   Patient is aware of $0 copayment.

## 2023-09-05 NOTE — Progress Notes (Signed)
Pharmacist Chemotherapy Monitoring - Initial Assessment    Anticipated start date: 09/12/23   The following has been reviewed per standard work regarding the patient's treatment regimen: The patient's diagnosis, treatment plan and drug doses, and organ/hematologic function Lab orders and baseline tests specific to treatment regimen  The treatment plan start date, drug sequencing, and pre-medications Prior authorization status  Patient's documented medication list, including drug-drug interaction screen and prescriptions for anti-emetics and supportive care specific to the treatment regimen The drug concentrations, fluid compatibility, administration routes, and timing of the medications to be used The patient's access for treatment and lifetime cumulative dose history, if applicable  The patient's medication allergies and previous infusion related reactions, if applicable   Changes made to treatment plan:  N/A  Follow up needed:  N/A   Duane [name], RPH, 09/05/2023  2:26 PM

## 2023-09-05 NOTE — Progress Notes (Signed)
Spoke w/ via phone for pre-op interview---pt Lab needs dos----   I stat      Lab results------EKG 08-12-2023 epic COVID test -----patient states asymptomatic no test needed Arrive at -------900 09-09-2023 NPO after MN NO Solid Food.  Clear liquids from MN until---800 Med rec completed Medications to take morning of surgery -----amlodipine, pravastatin Diabetic medication -----n/a Patient instructed no nail polish to be worn day of surgery Patient instructed to bring photo id and insurance card day of surgery Patient aware to have Driver (ride ) / caregiver    for 24 hours after surgery -  Duane [name] lea friend Patient Special Instructions -----none Pre-Op special Instructions -----none Patient verbalized understanding of instructions that were given at this phone interview. Patient denies chest pain, sob, fever, cough at the interview.

## 2023-09-05 NOTE — Telephone Encounter (Signed)
Oral Chemotherapy Pharmacist Encounter  I spoke with patient for overview of: Xeloda (capecitabine) for the treatment of stage IIC colon cancer in conjunction with oxaliplatin, planned duration ~4 cycles.   Counseled patient on administration, dosing, side effects, monitoring, drug-food interactions, safe handling, storage, and disposal.  Patient will take Xeloda 500mg  tablets, 3 tablets (1500mg ) by mouth in AM and 4 tabs (2000mg ) by mouth in PM, within 30 minutes of finishing meals, on days 1-14 of each 21 day cycle.   Xeloda and oxaliplatin start date: 09/12/23  Adverse effects include but are not limited to: fatigue, decreased blood counts, GI upset, diarrhea, mouth sores, and hand-foot syndrome. Hand-foot syndrome: discussed use of cream such as [place] Smooth Extra Care 20 or equivalent advanced care cream that has 20% urea content for advanced skin hydration while on Xeloda Diarrhea: Patient will obtain Imodium (loperamide) to have on hand if they experience diarrhea. Patient knows to alert the office of 4 or more loose stools above baseline.  Patient offered referral to diclofenac prophylaxis hand-foot syndrome research project. Patient agreed to referral.   Reviewed with patient importance of keeping a medication schedule and plan for any missed doses. No barriers to medication adherence identified.  Medication reconciliation performed and medication/allergy list updated.  All questions answered.  Duane [name] voiced understanding and appreciation.   Medication education handout placed in mail for patient. Patient knows to call the office with questions or concerns. Oral Chemotherapy Clinic phone number provided to patient.   Lenord Carbo, PharmD, BCPS, BCOP Hematology/Oncology Clinical Pharmacist [facility] Oral Chemotherapy Navigation Clinics [phone] 09/05/2023 11:24 AM

## 2023-09-05 NOTE — Progress Notes (Signed)
Oral Chemotherapy Pharmacist Encounter  Patient was counseled under telephone encounter from 08/31/23.  Lenord Carbo, PharmD, BCPS, [facility] Hematology/Oncology Clinical Pharmacist [facility] Oral Chemotherapy Navigation Clinics [phone] 09/05/2023 11:31 AM

## 2023-09-06 ENCOUNTER — Telehealth: Payer: Self-pay

## 2023-09-06 NOTE — Telephone Encounter (Signed)
Voicemail left today regarding enrollment for topical diclofenac study (IRB [phone]). Patient instructed to call back to inquire further for additional enrollment details.   Jerry Caras, PharmD PGY2 Oncology Pharmacy Resident  Principal Investigator  [phone]  09/06/2023 4:13 PM

## 2023-09-07 NOTE — Telephone Encounter (Signed)
Left second voicemail today regarding enrollment for topical diclofenac study (IRB [phone]). Patient instructed to call back to inquire further for additional enrollment details.   Jerry Caras, PharmD PGY2 Oncology Pharmacy Resident  Principal Investigator  [phone]  09/07/2023 3:35 PM

## 2023-09-08 ENCOUNTER — Ambulatory Visit: Payer: Self-pay | Admitting: Surgery

## 2023-09-09 ENCOUNTER — Inpatient Hospital Stay: Payer: Commercial Managed Care - HMO | Attending: Hematology

## 2023-09-09 ENCOUNTER — Ambulatory Visit (HOSPITAL_COMMUNITY): Payer: Managed Care, Other (non HMO)

## 2023-09-09 ENCOUNTER — Other Ambulatory Visit (HOSPITAL_COMMUNITY): Payer: Self-pay

## 2023-09-09 ENCOUNTER — Ambulatory Visit (HOSPITAL_BASED_OUTPATIENT_CLINIC_OR_DEPARTMENT_OTHER): Payer: Managed Care, Other (non HMO) | Admitting: Anesthesiology

## 2023-09-09 ENCOUNTER — Encounter (HOSPITAL_BASED_OUTPATIENT_CLINIC_OR_DEPARTMENT_OTHER): Payer: Self-pay | Admitting: Surgery

## 2023-09-09 ENCOUNTER — Ambulatory Visit (HOSPITAL_BASED_OUTPATIENT_CLINIC_OR_DEPARTMENT_OTHER)
Admission: RE | Admit: 2023-09-09 | Discharge: 2023-09-09 | Disposition: A | Discharge status: Home / Self Care | Payer: Managed Care, Other (non HMO) | Attending: Surgery | Admitting: Surgery

## 2023-09-09 ENCOUNTER — Other Ambulatory Visit: Payer: Self-pay

## 2023-09-09 ENCOUNTER — Encounter (HOSPITAL_BASED_OUTPATIENT_CLINIC_OR_DEPARTMENT_OTHER)
Admission: RE | Disposition: A | Discharge status: Home / Self Care | Payer: Self-pay | Source: Home / Self Care | Attending: Surgery

## 2023-09-09 DIAGNOSIS — Z7984 Long term (current) use of oral hypoglycemic drugs: Secondary | ICD-10-CM | POA: Diagnosis not present

## 2023-09-09 DIAGNOSIS — C189 Malignant neoplasm of colon, unspecified: Secondary | ICD-10-CM | POA: Insufficient documentation

## 2023-09-09 DIAGNOSIS — E119 Type 2 diabetes mellitus without complications: Secondary | ICD-10-CM | POA: Insufficient documentation

## 2023-09-09 DIAGNOSIS — I10 Essential (primary) hypertension: Secondary | ICD-10-CM | POA: Insufficient documentation

## 2023-09-09 DIAGNOSIS — C187 Malignant neoplasm of sigmoid colon: Secondary | ICD-10-CM

## 2023-09-09 DIAGNOSIS — Z01818 Encounter for other preprocedural examination: Secondary | ICD-10-CM

## 2023-09-09 DIAGNOSIS — Z833 Family history of diabetes mellitus: Secondary | ICD-10-CM | POA: Diagnosis not present

## 2023-09-09 DIAGNOSIS — Z8249 Family history of ischemic heart disease and other diseases of the circulatory system: Secondary | ICD-10-CM | POA: Insufficient documentation

## 2023-09-09 DIAGNOSIS — E785 Hyperlipidemia, unspecified: Secondary | ICD-10-CM | POA: Diagnosis not present

## 2023-09-09 DIAGNOSIS — Z87891 Personal history of nicotine dependence: Secondary | ICD-10-CM | POA: Insufficient documentation

## 2023-09-09 DIAGNOSIS — Z79899 Other long term (current) drug therapy: Secondary | ICD-10-CM | POA: Insufficient documentation

## 2023-09-09 DIAGNOSIS — Z5112 Encounter for antineoplastic immunotherapy: Secondary | ICD-10-CM | POA: Insufficient documentation

## 2023-09-09 HISTORY — DX: Malignant neoplasm of colon, unspecified: C18.9

## 2023-09-09 HISTORY — PX: PORTACATH PLACEMENT: SHX2246

## 2023-09-09 LAB — POCT I-STAT, CHEM 8
BUN: 9 mg/dL (ref 8–23)
Calcium, Ion: 1.26 mmol/L (ref 1.15–1.40)
Chloride: 104 mmol/L (ref 98–111)
Creatinine, Ser: 1 mg/dL (ref 0.61–1.24)
Glucose, Bld: 113 mg/dL — ABNORMAL HIGH (ref 70–99)
HCT: 39 % (ref 39.0–52.0)
Hemoglobin: 13.3 g/dL (ref 13.0–17.0)
Potassium: 3.8 mmol/L (ref 3.5–5.1)
Sodium: 141 mmol/L (ref 135–145)
TCO2: 22 mmol/L (ref 22–32)

## 2023-09-09 LAB — GLUCOSE, CAPILLARY: Glucose-Capillary: 144 mg/dL — ABNORMAL HIGH (ref 70–99)

## 2023-09-09 SURGERY — INSERTION, TUNNELED CENTRAL VENOUS DEVICE, WITH PORT
Anesthesia: General | Site: Chest | Laterality: Right

## 2023-09-09 MED ORDER — CEFAZOLIN SODIUM-DEXTROSE 2-4 GM/100ML-% IV SOLN
2.0000 g | INTRAVENOUS | Status: AC
Start: 2023-09-09 — End: 2023-09-09
  Administered 2023-09-09: 2 g via INTRAVENOUS

## 2023-09-09 MED ORDER — CHLORHEXIDINE GLUCONATE CLOTH 2 % EX PADS: 6.0000 | MEDICATED_PAD | Freq: Once | CUTANEOUS | Status: DC

## 2023-09-09 MED ORDER — FENTANYL CITRATE (PF) 100 MCG/2ML IJ SOLN
INTRAMUSCULAR | Status: DC | PRN
  Administered 2023-09-09 (×2): 50 ug via INTRAVENOUS

## 2023-09-09 MED ORDER — PROPOFOL 10 MG/ML IV BOLUS
INTRAVENOUS | Status: AC
  Filled 2023-09-09: qty 20

## 2023-09-09 MED ORDER — CEFAZOLIN SODIUM-DEXTROSE 2-4 GM/100ML-% IV SOLN
INTRAVENOUS | Status: AC
  Filled 2023-09-09: qty 100

## 2023-09-09 MED ORDER — ACETAMINOPHEN 500 MG PO TABS
1000.0000 mg | ORAL_TABLET | ORAL | Status: AC
  Administered 2023-09-09: 1000 mg via ORAL

## 2023-09-09 MED ORDER — MIDAZOLAM HCL 5 MG/5ML IJ SOLN
INTRAMUSCULAR | Status: DC | PRN
  Administered 2023-09-09: 2 mg via INTRAVENOUS

## 2023-09-09 MED ORDER — LIDOCAINE-EPINEPHRINE 1 %-1:100000 IJ SOLN
INTRAMUSCULAR | Status: DC | PRN
  Administered 2023-09-09: 17 mL

## 2023-09-09 MED ORDER — LIDOCAINE HCL (CARDIAC) PF 100 MG/5ML IV SOSY
PREFILLED_SYRINGE | INTRAVENOUS | Status: DC | PRN
  Administered 2023-09-09: 100 mg via INTRAVENOUS

## 2023-09-09 MED ORDER — ONDANSETRON HCL 4 MG/2ML IJ SOLN
INTRAMUSCULAR | Status: DC | PRN
  Administered 2023-09-09: 4 mg via INTRAVENOUS

## 2023-09-09 MED ORDER — PHENYLEPHRINE HCL (PRESSORS) 10 MG/ML IV SOLN
INTRAVENOUS | Status: DC | PRN
  Administered 2023-09-09: 80 ug via INTRAVENOUS
  Administered 2023-09-09: 160 ug via INTRAVENOUS
  Administered 2023-09-09 (×2): 80 ug via INTRAVENOUS

## 2023-09-09 MED ORDER — TRAMADOL HCL 50 MG PO TABS
50.0000 mg | ORAL_TABLET | Freq: Four times a day (QID) | ORAL | 0 refills | Status: AC | PRN
  Filled 2023-09-09: qty 5, 2d supply, fill #0

## 2023-09-09 MED ORDER — HEPARIN SOD (PORK) LOCK FLUSH 100 UNIT/ML IV SOLN
INTRAVENOUS | Status: DC | PRN
  Administered 2023-09-09: 200 [IU]

## 2023-09-09 MED ORDER — DICLOFENAC SODIUM 1 % EX GEL
Freq: Two times a day (BID) | CUTANEOUS | 0 refills | Status: AC
  Filled 2023-09-09: qty 400, 90d supply, fill #0

## 2023-09-09 MED ORDER — PROPOFOL 10 MG/ML IV BOLUS
INTRAVENOUS | Status: DC | PRN
  Administered 2023-09-09: 100 mg via INTRAVENOUS
  Administered 2023-09-09: 200 mg via INTRAVENOUS

## 2023-09-09 MED ORDER — IOHEXOL 300 MG/ML  SOLN
INTRAMUSCULAR | Status: DC | PRN
  Administered 2023-09-09: 5 mL

## 2023-09-09 MED ORDER — LACTATED RINGERS IV SOLN: INTRAVENOUS | Status: DC

## 2023-09-09 MED ORDER — PHENYLEPHRINE 80 MCG/ML (10ML) SYRINGE FOR IV PUSH (FOR BLOOD PRESSURE SUPPORT)
PREFILLED_SYRINGE | INTRAVENOUS | Status: AC
  Filled 2023-09-09: qty 10

## 2023-09-09 MED ORDER — LIDOCAINE HCL (PF) 2 % IJ SOLN
INTRAMUSCULAR | Status: AC
  Filled 2023-09-09: qty 5

## 2023-09-09 MED ORDER — ONDANSETRON HCL 4 MG/2ML IJ SOLN
INTRAMUSCULAR | Status: AC
  Filled 2023-09-09: qty 2

## 2023-09-09 MED ORDER — DEXAMETHASONE SODIUM PHOSPHATE 10 MG/ML IJ SOLN
INTRAMUSCULAR | Status: AC
  Filled 2023-09-09: qty 1

## 2023-09-09 MED ORDER — DEXAMETHASONE SODIUM PHOSPHATE 10 MG/ML IJ SOLN
INTRAMUSCULAR | Status: DC | PRN
  Administered 2023-09-09: 5 mg via INTRAVENOUS

## 2023-09-09 MED ORDER — HEPARIN 6000 UNIT IRRIGATION SOLUTION
Freq: Once | Status: AC
  Administered 2023-09-09: 1
  Filled 2023-09-09: qty 6000

## 2023-09-09 MED ORDER — MIDAZOLAM HCL 2 MG/2ML IJ SOLN
INTRAMUSCULAR | Status: AC
Start: 2023-09-09 — End: ?
  Filled 2023-09-09: qty 2

## 2023-09-09 MED ORDER — EPHEDRINE SULFATE (PRESSORS) 50 MG/ML IJ SOLN
INTRAMUSCULAR | Status: DC | PRN
  Administered 2023-09-09: 10 mg via INTRAVENOUS

## 2023-09-09 MED ORDER — FENTANYL CITRATE (PF) 100 MCG/2ML IJ SOLN
INTRAMUSCULAR | Status: AC
  Filled 2023-09-09: qty 2

## 2023-09-09 MED ORDER — SODIUM CHLORIDE 0.9 % IV SOLN: INTRAVENOUS | Status: DC

## 2023-09-09 MED ORDER — ACETAMINOPHEN 500 MG PO TABS
ORAL_TABLET | ORAL | Status: AC
  Filled 2023-09-09: qty 2

## 2023-09-09 SURGICAL SUPPLY — 45 items
ADH SKN CLS APL DERMABOND .7 (GAUZE/BANDAGES/DRESSINGS) ×2
APL PRP STRL LF DISP 70% ISPRP (MISCELLANEOUS) ×1
APL SKNCLS STERI-STRIP NONHPOA (GAUZE/BANDAGES/DRESSINGS) ×1
BAG DECANTER FOR FLEXI CONT (MISCELLANEOUS) ×1 IMPLANT
BENZOIN TINCTURE PRP APPL 2/3 (GAUZE/BANDAGES/DRESSINGS) ×1 IMPLANT
BLADE SURG 15 STRL LF DISP TIS (BLADE) ×1 IMPLANT
BLADE SURG 15 STRL SS (BLADE) ×1
BLADE SURG SZ11 CARB STEEL (BLADE) ×1 IMPLANT
CHLORAPREP W/TINT 26 (MISCELLANEOUS) ×1 IMPLANT
COVER BACK TABLE 60X90IN (DRAPES) ×1 IMPLANT
COVER MAYO STAND STRL (DRAPES) ×1 IMPLANT
COVER PROBE U/S 5X48 (MISCELLANEOUS) ×1 IMPLANT
DERMABOND ADVANCED .7 DNX12 (GAUZE/BANDAGES/DRESSINGS) ×1 IMPLANT
DRAPE C-ARM 42X120 X-RAY (DRAPES) ×1 IMPLANT
DRAPE LAPAROSCOPIC ABDOMINAL (DRAPES) ×1 IMPLANT
DRSG TEGADERM 2-3/8X2-3/4 SM (GAUZE/BANDAGES/DRESSINGS) ×1 IMPLANT
DRSG TEGADERM 4X4.75 (GAUZE/BANDAGES/DRESSINGS) ×1 IMPLANT
ELECT REM PT RETURN 9FT ADLT (ELECTROSURGICAL) ×1
ELECTRODE REM PT RTRN 9FT ADLT (ELECTROSURGICAL) ×1 IMPLANT
GAUZE 4X4 16PLY ~~LOC~~+RFID DBL (SPONGE) ×2 IMPLANT
GAUZE SPONGE 4X4 12PLY STRL (GAUZE/BANDAGES/DRESSINGS) ×1 IMPLANT
GLOVE BIOGEL M STRL SZ7.5 (GLOVE) ×1 IMPLANT
GLOVE ECLIPSE 8.0 STRL XLNG CF (GLOVE) ×1 IMPLANT
GLOVE INDICATOR 8.0 STRL GRN (GLOVE) ×1 IMPLANT
GOWN STRL REUS W/TWL XL LVL3 (GOWN DISPOSABLE) ×2 IMPLANT
KIT PORT INFUSION SMART 8FR (Port) IMPLANT
KIT PORT POWER 8FR ISP CVUE (Port) ×1 IMPLANT
KIT TURNOVER CYSTO (KITS) ×1 IMPLANT
NDL HYPO 25X1 1.5 SAFETY (NEEDLE) ×1 IMPLANT
NEEDLE HYPO 25X1 1.5 SAFETY (NEEDLE) ×1
NS IRRIG 1000ML POUR BTL (IV SOLUTION) ×1 IMPLANT
PACK BASIN DAY SURGERY FS (CUSTOM PROCEDURE TRAY) ×1 IMPLANT
PENCIL SMOKE EVACUATOR (MISCELLANEOUS) IMPLANT
Rubber band steri IMPLANT
SLEEVE SCD COMPRESS KNEE MED (STOCKING) ×1 IMPLANT
SPIKE FLUID TRANSFER (MISCELLANEOUS) ×1 IMPLANT
STRIP CLOSURE SKIN 1/2X4 (GAUZE/BANDAGES/DRESSINGS) ×1 IMPLANT
SUT MNCRL AB 4-0 PS2 18 (SUTURE) ×1 IMPLANT
SUT PROLENE 2 0 SH DA (SUTURE) ×1 IMPLANT
SUT VIC AB 3-0 SH 27 (SUTURE) ×1
SUT VIC AB 3-0 SH 27XBRD (SUTURE) ×1 IMPLANT
SYR 10ML LL (SYRINGE) ×1 IMPLANT
SYR 20ML LL LF (SYRINGE) ×1 IMPLANT
SYR CONTROL 10ML LL (SYRINGE) ×1 IMPLANT
TOWEL OR 17X24 6PK STRL BLUE (TOWEL DISPOSABLE) ×1 IMPLANT

## 2023-09-09 NOTE — Anesthesia Preprocedure Evaluation (Addendum)
Anesthesia Evaluation  Patient identified by MRN, date of birth, ID band Patient awake    Reviewed: Allergy & Precautions, NPO status , Patient's Chart, lab work & pertinent test results  History of Anesthesia Complications Negative for: history of anesthetic complications  Airway Mallampati: II  TM Distance: >3 FB Neck ROM: Full    Dental  (+) Poor Dentition, Missing, Dental Advisory Given, Chipped   Pulmonary former smoker   breath sounds clear to auscultation       Cardiovascular hypertension, Pt. on medications (-) angina  Rhythm:Regular Rate:Normal     Neuro/Psych negative neurological ROS     GI/Hepatic Neg liver ROS,GERD  Controlled,,Colon cancer   Endo/Other  diabetes (glu 113), Oral Hypoglycemic Agents    Renal/GU negative Renal ROS     Musculoskeletal   Abdominal   Peds  Hematology negative hematology ROS (+)   Anesthesia Other Findings   Reproductive/Obstetrics                             Anesthesia Physical Anesthesia Plan  ASA: 3  Anesthesia Plan: General   Post-op Pain Management: Tylenol PO (pre-op)* and Minimal or no pain anticipated   Induction: Intravenous  PONV Risk Score and Plan: 2 and Ondansetron and Dexamethasone  Airway Management Planned: LMA  Additional Equipment: None  Intra-op Plan:   Post-operative Plan:   Informed Consent: I have reviewed the patients History and Physical, chart, labs and discussed the procedure including the risks, benefits and alternatives for the proposed anesthesia with the patient or authorized representative who has indicated his/her understanding and acceptance.     Dental advisory given  Plan Discussed with: CRNA and Surgeon  Anesthesia Plan Comments:        Anesthesia Quick Evaluation

## 2023-09-09 NOTE — Discharge Instructions (Addendum)
POST OP INSTRUCTIONS  DIET: As tolerated.  Take your usually prescribed home medications unless otherwise directed.  PAIN CONTROL: Pain is best controlled by a usual combination of three different methods TOGETHER: Ice/Heat Over the counter pain medication Prescription pain medication Most patients will experience some swelling and bruising around the surgical site.  Ice packs or heating pads (30-60 minutes up to 6 times a day) will help. Some people prefer to use ice alone, heat alone, alternating between ice & heat.  Experiment to what works for you.  Swelling and bruising can take several weeks to resolve.   It is helpful to take an over-the-counter pain medication regularly for the first few weeks: Ibuprofen (Motrin/Advil) - 200mg  tabs - take 3 tabs (600mg ) every 6 hours as needed for pain Acetaminophen (Tylenol) - you may take 650mg  every 6 hours as needed. You can take this with motrin as they act differently on the body. If you are taking a narcotic pain medication that has acetaminophen in it, do not take over the counter tylenol at the same time.  Iii. NOTE: You may take both of these medications together - most patients  find it most helpful when alternating between the two (i.e. Ibuprofen at 6am, tylenol at 9am, ibuprofen at 12pm ..Marland Kitchen) A  prescription for pain medication should be given to you upon discharge.  Take your pain medication as prescribed if your pain is not adequatly controlled with the over-the-counter pain reliefs mentioned above.  Avoid getting constipated.  Between the surgery and the pain medications, it is common to experience some constipation.  Increasing fluid intake and taking a fiber supplement (such as Metamucil, Citrucel, FiberCon, MiraLax, etc) 1-2 times a day regularly will usually help prevent this problem from occurring.  A mild laxative (prune juice, Milk of Magnesia, MiraLax, etc) should be taken according to package directions if there are no bowel  movements after 48 hours.    Dressing: Your incisions are covered in Dermabond which is like sterile superglue for the skin. This will come off on it's own in a couple weeks. It is waterproof and you may bathe normally starting the day after your surgery in a shower. Avoid baths/pools/lakes/oceans until your wounds have fully healed. Your port-a-catheter will be used for chemotherapy. Dr. Latanya Maudlin team will be overseeing care of the site and flushing the port for you. When they deem it is safe to be removed after treatment, we are happy to remove this for you.  ACTIVITIES as tolerated:   You may resume regular (light) daily activities beginning the next day--such as daily self-care, walking, climbing stairs--gradually increasing activities as tolerated.  If you can walk 30 minutes without difficulty, it is safe to try more intense activity such as jogging, treadmill, bicycling, low-impact aerobics.  DO NOT PUSH THROUGH PAIN.  Let pain be your guide: If it hurts to do something, don't do it. You may drive when you are no longer taking prescription pain medication, you can comfortably wear a seatbelt, and you can safely maneuver your car and apply brakes.   FOLLOW UP in our office Please call [facility] at [phone] to set up an appointment to see your surgeon in the office for a follow-up appointment approximately 2 weeks after your surgery. Make sure that you call for this appointment the day you arrive home to insure a convenient appointment time.  9. If you have disability or family leave forms that need to be completed, you may have them completed by  your primary care physician's office; for return to work instructions, please ask our office staff and they will be happy to assist you in obtaining this documentation   When to call us [phone]: Poor pain control Reactions / problems with new medications (rash/itching, etc)  Fever over 101.5 F (38.5 C) Inability to  urinate Nausea/vomiting Worsening swelling or bruising Continued bleeding from incision. Increased pain, redness, or drainage from the incision  The clinic staff is available to answer your questions during regular business hours (8:30am-5pm).  Please don't hesitate to call and ask to speak to one of our nurses for clinical concerns.   A surgeon from [facility] is always on call at the hospitals   If you have a medical emergency, go to the nearest emergency room or call 911.  [facility] [address], [place], [place]  [address] MAIN: [phone] FAX: [phone] [email or web address]     No acetaminophen/Tylenol until after 3:20 pm today if needed.   Post Anesthesia Home Care Instructions  Activity: Get plenty of rest for the remainder of the day. A responsible individual must stay with you for 24 hours following the procedure.  For the next 24 hours, DO NOT: -Drive a car -Advertising copywriter -Drink alcoholic beverages -Take any medication unless instructed by your physician -Make any legal decisions or sign important papers.  Meals: Start with liquid foods such as gelatin or soup. Progress to regular foods as tolerated. Avoid greasy, spicy, heavy foods. If nausea and/or vomiting occur, drink only clear liquids until the nausea and/or vomiting subsides. Call your physician if vomiting continues.  Special Instructions/Symptoms: Your throat may feel dry or sore from the anesthesia or the breathing tube placed in your throat during surgery. If this causes discomfort, gargle with warm salt water. The discomfort should disappear within 24 hours.

## 2023-09-09 NOTE — Progress Notes (Signed)
Research Note - Consent Authorization   Study Name: Evaluation of Topical Diclofenac Use on the Incidence and Severity of Hand Foot Syndrome in Patients with Breast and Gastrointestinal Malignancies Taking Capecitabine   IRB# [id number]   A summary of the study was presented to the patient, including potential risks and benefits from study participation, alternatives treatment options available other than study participation, how their confidentiality will be maintained, and who to contact if they had questions regarding their rights as a study participant or if they experience an injury associated with their participation. They were told that participation is completely voluntary and choosing not to participate would not impact care they would otherwise have access to. They were also told that even if they choose to participate, they can withdraw their consent to participate at any time in the future. Any questions the patient asked were addressed by a member of the research team.   After having all of their questions answered, the patient agreed to participate in the study by signing the study consent forms. 09/09/2023 at 3:21 PM.    Jerry Caras, PharmD PGY2 Oncology Pharmacy Resident  Principal Investigator  [phone]

## 2023-09-09 NOTE — Transfer of Care (Signed)
Immediate Anesthesia Transfer of Care Note  Patient: Duane [name]  Procedure(s) Performed: Procedure(s) (LRB): INSERTION PORT-A-CATH ULTRASOUND FLUOROSCOPY (Right)  Patient Location: PACU  Anesthesia Type: GA  Level of Consciousness: awake, sedated, patient cooperative and responds to stimulation  Airway & Oxygen Therapy: Patient Spontanous Breathing and Patient connected to [place] oxygen  Post-op Assessment: Report given to PACU RN, Post -op Vital signs reviewed and stable and Patient moving all extremities  Post vital signs: Reviewed and stable  Complications: No apparent anesthesia complications  ** Front teeth condition as preop - no problems inserting and removing LMA

## 2023-09-09 NOTE — Anesthesia Postprocedure Evaluation (Signed)
Anesthesia Post Note  Patient: Duane [name]  Procedure(s) Performed: INSERTION PORT-A-CATH ULTRASOUND FLUOROSCOPY (Right: Chest)     Patient location during evaluation: PACU Anesthesia Type: General Level of consciousness: awake and alert, patient cooperative and oriented Pain management: pain level controlled Vital Signs Assessment: post-procedure vital signs reviewed and stable Respiratory status: spontaneous breathing, nonlabored ventilation and respiratory function stable Cardiovascular status: blood pressure returned to baseline and stable Postop Assessment: no apparent nausea or vomiting Anesthetic complications: no   No notable events documented.  Last Vitals:  Vitals:   09/09/23 1211 09/09/23 1215  BP: 128/68 120/73  Pulse: (!) 59 (!) 58  Resp: 11 10  Temp: (!) 36.3 C   SpO2: 100% 100%    Last Pain:  Vitals:   09/09/23 1211  TempSrc:   PainSc: Asleep                 Ules Marsala,E. Mikhail Hallenbeck

## 2023-09-09 NOTE — H&P (Signed)
CC: Here today for surgery  HPI: Duane [name] is an 62 y.o. male with history of HTN, DM, HLD, whom is seen in the office today as a referral by Dr. Seymour Bars for evaluation of colon mass.  CT A/P 05/21/23 1. Sigmoid colon bowel wall thickening may reflect a colonic mass or focal diverticulitis. Multiple rim enhancing fluid collections in the abdomen or pelvis likely reflect abscesses and suggest bowel perforation due to the colonic mass/focal diverticulitis. 2. Multiple loops of dilated small bowel with a possible transition to decompressed small bowel in the lower right abdomen. This may reflect ileus or small-bowel obstruction and is likely related to the multiple abdominal abscesses/intraperitoneal inflammatory process.  CT Guided drain placement 05/22/23 Dr. Derrell Lolling 1. Placement of a 12 French drainage catheter into the low anterior abdominal fluid collection with aspiration of 100 mL foul-smelling purulent fluid which was sent for Gram stain and culture. 2. Placement of a 12 French drainage catheter via a left transgluteal approach into the pelvic cul-de-sac collection with aspiration of an additional 100 mL foul-smelling purulent fluid.  Drain study 06/04/23 1. Minimal residual cavity at the anterior midline pelvic drain. No fistulous communication was seen. Given 25 mL of daily output of cloudy Duane [name] fluid, this drain was left in place. 2. Given only minimal serosanguineous output from the posterior drain and no significant residual cavity or fistulous communication, the drain was removed.  Colonoscopy 06/07/23 - Dr. Leonides Schanz: - Preparation of the colon was fair - The examined portion of the ileum was normal - Diverticulosis in the sigmoid colon, in the descending colon, in the transverse colon and in the ascending colon. - Two 4 to 10 mm polyps in the transverse colon and in the ascending colon, removed with a cold snare. Resected and retrieved. - Two 10 to 15 mm polyps in  the descending colon and in the transverse colon, removed with a hot snare. Resected and retrieved. - One 4 mm polyp in the sigmoid colon, removed with a cold snare. Resected and retrieved. - Likely malignant partially obstructing tumor in the sigmoid colon. Biopsied. Tattooed. - One 20 mm polyp in the rectum, removed with a hot snare. Resected and retrieved. - One 5 mm polyp in the rectum, removed with a cold snare. Resected and retrieved. - Non-bleeding internal hemorrhoids.  PATH 1. Colon, polyp(s), transverse and ascending (3) - TUBULAR ADENOMA, FRAGMENTS.  2. Colon, polyp(s), sigmoid and descending (2) - TUBULOVILLOUS/TUBULAR ADENOMA, FRAGMENTS.  3. 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  NOTE: IT IS NOTED THAT THIS IS SUBMITTED AS A MASS. WHILE THERE IS FOCAL SPLAYING OF SMOOTH MUSCLE DEFINITIVE EVIDENCE OF INVASION INCLUDING DESMOPLASIA OR THE PRESENCE OF SUBMUCOSAL TISSUE IS NOT PRESENT IN THESE SUPERFICIAL BIOPSIES. CLINICAL/ENDOSCOPIC CORRELATION NECESSARY.  4. Rectum, polyp(s), (2) - TUBULOVILLOUS ADENOMA, FRAGMENTS.  NOTE: THE LARGEST FRAGMENT OF TUBULOVILLOUS ADENOMA HAS EXTENSIVE SMOOTH MUSCLE HYPERPLASIA WITH BULBOUS OUTGROWTHS OF THE EPITHELIUM THAT IS CONSIDERED PROLAPSE TYPE EFFECT/PSEUDOINVASION. THERE IS NO EVIDENCE OF INVASION. THIS POLYP HAS A PROMINENT STALK MARGIN WHICH IS NEGATIVE.  CEA 06/10/23: 3.1  He was referred to see me today to consider surgery.  CT PET completed and did not demonstrate any evident peritoneal carcinomatosis or FDG avid right lower quadrant soft tissue implants. Case was discussed at multidisciplinary tumor board. There were no visible areas that would be a candidate for sampling. Consensus recommendation was to proceed with attempt at surgery.  Currently, he reports no complaints  at present. He does still have 1 remaining IR drain in place. The output of this is scant and  generally pink/serosanguineous in color. He denies any fever/chills/nausea/vomiting. He is having regular bowel movements. He denies any evident blood in his stool.  He previously smoked but has quit since having been admitted for the above-mentioned problem.  OR 08/15/23 Robotic assisted low anterior resection with double stapled colorectal anastomosis Small bowel resection Drainage of intra-abdominal abscess 5 x 5 cm Robotic lysis of adhesions x 100 minutes Intraoperative assessment of perfusion using ICG fluorescence imaging Flexible sigmoidoscopy Bilateral transversus abdominus plane (TAP) blocks  FINDINGS: Significant adhesions related to suspected prior inflammatory reaction in the setting of a contained perforation. Large abscess cavity approximately 5 x 5 cm in size on the anterior abdominal wall with an indwelling percutaneous drain. The quality of the tissues all around this was however quite healthy. There was some involved small intestine with this cavity that we ultimately resected for both oncologic purposes in because of the possibility of underlying bowel fistula. No obvious metastatic disease on visceral parietal peritoneum or liver. A well perfused, tension free, hemostatic, air tight 29 mm EEA colorectal anastomosis fashioned 15 cm from the anal verge by flexible sigmoidoscopy.   PATH A. ILEUM, RESECTION: - Small intestine with focal well to moderately differentiated adenocarcinoma associated with extensive acute inflammation/abscess formation and prominent serosal adhesions. - Margins negative - 6 lymph nodes, negative for malignancy (0/6). - See also part B and synoptic report below.  B. 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 - Margins negative. -  14 lymph nodes, negative for malignancy (0/14).  Note: In conjunction with the microscopic evidence of perforation and the presence of well to moderately differentiated adenocarcinoma present in the ileum with associated abscess formation and serosal adhesions, it is interpreted at there was direct extension (i.e. pT4b) of the tumor as opposed to metastasis in part A.  C. DISTAL DONUT: - Unremarkable colon, negative for malignancy.   He recovered well from surgery and was discharged home 08/17/2023. Case was subsequently discussed at our multidisciplinary tumor board alongside Dr. Mosetta Putt. Consensus recommendation was for adjuvant chemotherapy to be administered. He is currently scheduled for portacatheter placement for that purpose later this week.   Since surgery, he reports he has done quite well. He has no complaints at present. No abdominal pain. No fever/chills. No nausea or vomiting. No incisional or wound complaints. He reports he is having regular daily bowel movements which are soft. He denies any constipation or diarrhea. He denies any blood in his stool. All of his drain sites have closed.   He denies any changes in health or health history since we met in the office. No new medications/allergies. He states he is ready for surgery today.  PMH: HTN, DM, HLD  PSH: Denies ever having had any prior abdominal or pelvic surgical history.  FHx: Denies any known family history of colorectal, breast, endometrial or ovarian cancer  Social Hx: Denies use of tobacco/EtOH/illicit drug. He is not currently working but previously worked in a factory for many many years and then in [employer]. He is here today by himself.  Medical History: Past Medical History:  Diagnosis Date  Diabetes mellitus without complication (CMS/HHS-HCC)  Hyperlipidemia  Hypertension   There is no problem list on file for this patient.  History reviewed. No pertinent surgical history.   Allergies   Allergen Reactions  Codeine Unknown   Current Outpatient Medications on File Prior to Visit  Medication Sig Dispense Refill  hydroCHLOROthiazide (HYDRODIURIL) 25 MG tablet  metFORMIN (GLUCOPHAGE) 500 MG tablet Take 500 mg by mouth 2 (two) times daily with meals  metroNIDAZOLE (FLAGYL) 500 MG tablet Take according to your procedure colon prep instructions 6 tablet 0  neomycin 500 mg tablet Take according to your procedure colon prep instructions 6 tablet 0  pravastatin (PRAVACHOL) 20 MG tablet Take 1 tablet by mouth once daily   No current facility-administered medications on file prior to visit.   Family History  Problem Relation Age of Onset  High blood pressure (Hypertension) Sister  Diabetes Sister  High blood pressure (Hypertension) Brother  Diabetes Brother    Social History   Tobacco Use  Smoking Status Every Day  Types: Cigarettes  Smokeless Tobacco Not on file    Social History   Socioeconomic History  Marital status: Single  Tobacco Use  Smoking status: Every Day  Types: Cigarettes  Vaping Use  Vaping status: Unknown  Substance and Sexual Activity  Alcohol use: Not Currently  Drug use: Never   Social Drivers of Health   Food Insecurity: No Food Insecurity (08/15/2023)  Received [facility]  Hunger Vital Sign  Worried About Running Out of Food in the Last Year: Never true  Ran Out of Food in the Last Year: Never true  Transportation Needs: No Transportation Needs (08/15/2023)  Received from [facility]  Lack of Transportation (Medical): No  Lack of Transportation (Non-Medical): No    Past Medical History:  Diagnosis Date   Colon cancer (HCC)    Diabetes mellitus without complication (HCC) type 2    GERD (gastroesophageal reflux disease)    Hypertension     Past Surgical History:  Procedure Laterality Date   COLONOSCOPY     FLEXIBLE SIGMOIDOSCOPY N/A 08/15/2023   Procedure: FLEXIBLE SIGMOIDOSCOPY;  Surgeon:  Andria Meuse, MD;  Location: [facility];  Service: General;  Laterality: N/A;   IR RADIOLOGIST EVAL & MGMT  06/03/2023   LAPAROSCOPY N/A 08/15/2023   Procedure: LAPAROSCOPY DIAGNOSTIC;  Surgeon: Andria Meuse, MD;  Location: [facility];  Service: General;  Laterality: N/A;   XI ROBOTIC ASSISTED LOWER ANTERIOR RESECTION N/A 08/15/2023   Procedure: XI ROBOTIC ASSISTED LOWER ANTERIOR RESECTION, SMALL BOWEL RESECTION, LYSIS OF ADHESIONS, AND INTRAOPERATIVE ASSESSMENT OF PERFUSION USING ICG DYE;  Surgeon: Andria Meuse, MD;  Location: [facility];  Service: General;  Laterality: N/A;    Family History  Problem Relation Age of Onset   Colon cancer Neg Hx    Colon polyps Neg Hx    Rectal cancer Neg Hx    Stomach cancer Neg Hx    Esophageal cancer Neg Hx     Social:  reports that he has quit smoking. His smoking use included cigarettes. He started smoking about 49 years ago. He has a 49.9 pack-year smoking history. He has never used smokeless tobacco. He reports that he does not currently use alcohol. He reports that he does not use drugs.  Allergies:  Allergies  Allergen Reactions   Codeine Other (See Comments)    "I get jittery."     Medications: I have reviewed the patient's current medications.  No results found for this or any previous visit (from the past 48 hour(s)).  No results found.   PE Height 6\' 2"  (  1.88 m), weight 92.1 kg. Constitutional: NAD; conversant Eyes: Moist conjunctiva; no lid lag; anicteric Lungs: Normal respiratory effort CV: RRR Psychiatric: Appropriate affect  No results found for this or any previous visit (from the past 48 hour(s)).  No results found.  A/P: Duane [name] is an 63 y.o. 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 He now has a suspected fistula to his 1 remaining IR drain.  CT PET  06/30/23 -hypermetabolic mass in the proximal sigmoid colon. No evidence of metastatic adenopathy or peritoneal metastases. No liver metastasis or distant metastasis seen.  Case discussed at MDT-consensus recommendation was to proceed with surgery. Interventional radiology reviewed the imaging as well and did not feel that there is any sort of evidence of persistent right lower quadrant soft tissue implants that would be amenable to any sort of percutaneous biopsy. No FDG avid lesions also decreases index of suspicion for peritoneal carcinomatosis but does not take it off the table.  Following with Dr. Mosetta Putt  -We spent time reviewing his procedure, findings, pathology, and plans moving forward. We discussed the consensus recommendations as noted with Dr. Mosetta Putt. We discussed rationale for portacatheter placement for systemic therapy administration. -We discussed placement of portacatheter under ultrasound and fluoroscopic guidance. The procedure, material risks (including, but not limited to, pain, bleeding, scarring, pneumothorax, hemothorax, injury to blood vessels/arteries, need for additional procedures, blood clots, malpositioning of portacatheter requiring replacement and/or removal, stroke, heart attack, and rarely, death) benefits, and alternatives were reviewed. All of his questions were welcomed and answered to his satisfaction. He expressed understanding of all the above and has agreed to proceed.  Marin Olp, MD [facility], [facility]

## 2023-09-09 NOTE — Anesthesia Procedure Notes (Signed)
Procedure Name: LMA Insertion Date/Time: 09/09/2023 10:33 AM  Performed by: Jessica Priest, CRNAPre-anesthesia Checklist: Patient identified, Emergency Drugs available, Suction available, Patient being monitored and Timeout performed Patient Re-evaluated:Patient Re-evaluated prior to induction Oxygen Delivery Method: Circle system utilized Preoxygenation: Pre-oxygenation with 100% oxygen Induction Type: IV induction Ventilation: Mask ventilation without difficulty LMA: LMA inserted LMA Size: 5.0 Number of attempts: 1 Airway Equipment and Method: Bite block Placement Confirmation: positive ETCO2, breath sounds checked- equal and bilateral and CO2 detector Tube secured with: Tape Dental Injury: Teeth and Oropharynx as per pre-operative assessment  Comments: Front incisors protruding - facing patient LEFT front larger incisor very loose. RIGHT front tooth intact - as preop - LMA placed gently avoiding front teeth - Dental risks explained preop to pt and wife

## 2023-09-09 NOTE — Op Note (Signed)
09/09/2023  12:06 PM  PATIENT:  Randall An  62 y.o. male  Patient Care Team: [name], NP as PCP - General (Internal Medicine)  PRE-OPERATIVE DIAGNOSIS: Colon cancer  POST-OPERATIVE DIAGNOSIS: Same  PROCEDURE:   1.  Placement of portacatheter-right internal jugular vein under fluoroscopy 2.  Ultrasound-guided access of right internal jugular vein (see photo below)  SURGEON:  Stephanie Coup. Klani Caridi, MD  ANESTHESIA:   local and general  COUNTS:  Sponge, needle and instrument counts were reported correct x2 at the conclusion of the operation.  EBL: 5 mL  DRAINS: None  SPECIMEN: None  COMPLICATIONS: None  FINDINGS: Standard right neck vascular anatomy.  Right internal jugular vein was accessed on the first attempt under ultrasound guidance.  Portacatheter placed uneventfully using fluoroscopy.  DISPOSITION: PACU in satisfactory condition  DESCRIPTION: The patient was identified in the preoperative holding area and taken to the OR where they were laid supine on the operating room table.  General anesthesia was induced. Arms were tucked.  A shoulder roll was placed under their shoulder blades. SCDs were also noted to be in place prior to the initiation of anesthesia.  The patient was then prepped and draped in the usual sterile fashion.   A surgical timeout was performed indicating the correct patient, procedure, positioning and need for preoperative antibiotics.   An ultrasound was used to confirm venous anatomy in the neck. The right internal jugular vein was visualized and noted to be compressible. The carotid is visualized posterior to the vein and non-compressible. Local anesthetic was infiltrated. The access needle was introduced under direct visualization into the right internal jugular vein. The j-wire was then advanced and both ultrasound and fluroscopy confirmed the wire to be in the right internal jugular vein, entering the cavoatrial junction in the right  chest.   *Photo demonstrating wire entering the right internal jugular vein with right carotid artery posterior to this.  The port pocket was created for the port in the right infraclavicular location. The pocket and planned tunnel was infiltrated with local anesthetic. The subcutaneous pocket was dissected down to the pectoral fascia. A tunneling device was used to tunnel the tubing from the right neck stab incision to the pocket. Under fluoroscopy, a peel-away-sheath was introduced down the wire. The wire and dilator were removed. 2-0 Prolene sutures were then loosely placed to the port and the pectoral fascia. The port tubing was introduced after being filled with injectable IV contrast. The sheath was cracked and peeled away. The catheter was then withdrawn until the tip sat in the cavoatrial junction. The tubing was cut to size and attached to the port with the locking adapter. The prolene sutures were then tied. A spot film confirmed the tubing had not migrated. 3-0 vicryl deep dermal sutures were used to close the pocket site. The skin of both incisions was then approximated with running subcuticular 4-0 monocryl suture. The incisions were covered with dermabond. The patient was then awakened and transferred to a stretcher for transport to PACU in satisfactory condition. A postop CXR has been ordered.

## 2023-09-10 ENCOUNTER — Other Ambulatory Visit (HOSPITAL_COMMUNITY): Payer: Self-pay

## 2023-09-11 NOTE — Assessment & Plan Note (Signed)
pT4bN0M0, MMR normal, G2 -Patient presented with perforated sigmoid colon and a contained abscess required IR drainage tube placement. -He underwent left hemicolectomy and partial small bowel resection.  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  All surgical margins were negative. -Due to the high risk of recurrence given T4b lesion and bowel perforation, I recommend adjuvant chemotherapy FOLFOX or CAPOX for 6 months if he can tolerate. He opted CAPOX and will start today on 09/12/2023

## 2023-09-12 ENCOUNTER — Other Ambulatory Visit: Payer: Self-pay

## 2023-09-12 ENCOUNTER — Inpatient Hospital Stay: Payer: Commercial Managed Care - HMO

## 2023-09-12 ENCOUNTER — Inpatient Hospital Stay (HOSPITAL_BASED_OUTPATIENT_CLINIC_OR_DEPARTMENT_OTHER): Payer: Commercial Managed Care - HMO | Admitting: Hematology

## 2023-09-12 ENCOUNTER — Other Ambulatory Visit (HOSPITAL_COMMUNITY): Payer: Self-pay

## 2023-09-12 ENCOUNTER — Encounter: Payer: Self-pay | Admitting: Hematology

## 2023-09-12 VITALS — BP 117/74 | HR 64 | Temp 98.0°F | Resp 16 | Wt 205.8 lb

## 2023-09-12 DIAGNOSIS — C187 Malignant neoplasm of sigmoid colon: Secondary | ICD-10-CM

## 2023-09-12 DIAGNOSIS — Z79899 Other long term (current) drug therapy: Secondary | ICD-10-CM | POA: Diagnosis not present

## 2023-09-12 DIAGNOSIS — Z5112 Encounter for antineoplastic immunotherapy: Secondary | ICD-10-CM | POA: Diagnosis present

## 2023-09-12 DIAGNOSIS — Z95828 Presence of other vascular implants and grafts: Secondary | ICD-10-CM | POA: Insufficient documentation

## 2023-09-12 LAB — CMP (CANCER CENTER ONLY)
ALT: 13 U/L (ref 0–44)
AST: 14 U/L — ABNORMAL LOW (ref 15–41)
Albumin: 3.8 g/dL (ref 3.5–5.0)
Alkaline Phosphatase: 53 U/L (ref 38–126)
Anion gap: 6 (ref 5–15)
BUN: 9 mg/dL (ref 8–23)
CO2: 27 mmol/L (ref 22–32)
Calcium: 9.4 mg/dL (ref 8.9–10.3)
Chloride: 106 mmol/L (ref 98–111)
Creatinine: 0.95 mg/dL (ref 0.61–1.24)
GFR, Estimated: >60 mL/min (ref >60)
Glucose, Bld: 137 mg/dL — ABNORMAL HIGH (ref 70–99)
Potassium: 3.7 mmol/L (ref 3.5–5.1)
Sodium: 139 mmol/L (ref 135–145)
Total Bilirubin: 0.9 mg/dL (ref <1.2)
Total Protein: 7.2 g/dL (ref 6.5–8.1)

## 2023-09-12 LAB — CBC WITH DIFFERENTIAL (CANCER CENTER ONLY)
Abs Immature Granulocytes: 0.03 10*3/uL (ref 0.00–0.07)
Basophils Absolute: 0 10*3/uL (ref 0.0–0.1)
Basophils Relative: 1 %
Eosinophils Absolute: 0.1 10*3/uL (ref 0.0–0.5)
Eosinophils Relative: 2 %
HCT: 37.2 % — ABNORMAL LOW (ref 39.0–52.0)
Hemoglobin: 13 g/dL (ref 13.0–17.0)
Immature Granulocytes: 1 %
Lymphocytes Relative: 31 %
Lymphs Abs: 2 10*3/uL (ref 0.7–4.0)
MCH: 28.4 pg (ref 26.0–34.0)
MCHC: 34.9 g/dL (ref 30.0–36.0)
MCV: 81.4 fL (ref 80.0–100.0)
Monocytes Absolute: 0.5 10*3/uL (ref 0.1–1.0)
Monocytes Relative: 8 %
Neutro Abs: 3.8 10*3/uL (ref 1.7–7.7)
Neutrophils Relative %: 57 %
Platelet Count: 215 10*3/uL (ref 150–400)
RBC: 4.57 MIL/uL (ref 4.22–5.81)
RDW: 14.6 % (ref 11.5–15.5)
WBC Count: 6.4 10*3/uL (ref 4.0–10.5)
nRBC: 0 % (ref 0.0–0.2)

## 2023-09-12 MED ORDER — PALONOSETRON HCL INJECTION 0.25 MG/5ML
0.2500 mg | Freq: Once | INTRAVENOUS | Status: AC
  Administered 2023-09-12: 0.25 mg via INTRAVENOUS
  Filled 2023-09-12: qty 5

## 2023-09-12 MED ORDER — DEXAMETHASONE SODIUM PHOSPHATE 10 MG/ML IJ SOLN
10.0000 mg | Freq: Once | INTRAMUSCULAR | Status: AC
Start: 2023-09-12 — End: 2023-09-12
  Administered 2023-09-12: 10 mg via INTRAVENOUS
  Filled 2023-09-12: qty 1

## 2023-09-12 MED ORDER — DEXTROSE 5 % IV SOLN
INTRAVENOUS | Status: DC
Start: 2023-09-12 — End: 2023-09-12

## 2023-09-12 MED ORDER — SODIUM CHLORIDE 0.9% FLUSH
10.0000 mL | Freq: Once | INTRAVENOUS | Status: AC
  Administered 2023-09-12: 10 mL

## 2023-09-12 MED ORDER — OXALIPLATIN CHEMO INJECTION 100 MG/20ML
130.0000 mg/m2 | Freq: Once | INTRAVENOUS | Status: AC
  Administered 2023-09-12: 300 mg via INTRAVENOUS
  Filled 2023-09-12: qty 60

## 2023-09-12 NOTE — Progress Notes (Signed)
[facility]   Telephone:([phone] Fax:([phone]   Clinic Follow up Note   Patient Care Team: Duane Sessions, NP as PCP - General (Internal Medicine)  Date of Service:  09/12/2023  CHIEF COMPLAINT: f/u of colon cancer  CURRENT THERAPY:  Adjuvant CapeOx  Oncology History   Cancer of sigmoid colon (HCC) pT4bN0M0, MMR normal, G2 -Patient presented with perforated sigmoid colon and Duane contained abscess required IR drainage tube placement. -He underwent left hemicolectomy and partial small bowel resection.  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  All surgical margins were negative. -Due to the high risk of recurrence given T4b lesion and bowel perforation, I recommend adjuvant chemotherapy FOLFOX or CAPOX for 6 months if he can tolerate. He opted CAPOX and will start today on 09/12/2023    Assessment and Plan    Sigmoid Colon Cancer Follow-up for sigmoid colon cancer. Starting intravenous chemotherapy and prescribed oral capecitabine today. Educated on post-chemotherapy care, including avoiding cold foods/drinks to prevent mouth/throat irritation. Blood counts normal; kidney and liver function tests pending. Discussed nausea medications (ondansetron, prochlorperazine) and monitoring for fever, chills, dehydration, dizziness, and diarrhea. Friend providing home support; requires transportation assistance. Social worker referral made today - Administer intravenous chemotherapy - Instruct to avoid cold foods/drinks - Educate on nausea medications (ondansetron, prochlorperazine) - Monitor for fever, chills, dehydration, dizziness, diarrhea - Call next week to check on oral chemotherapy adherence and side effects - Schedule next infusion for October 03, 2023 - Arrange transportation assistance - Refer to Child psychotherapist for additional support  General Health  Maintenance Discussed need for transportation and social support. - Arrange transportation assistance - Refer to social worker for additional support  Plan -Lab reviewed, adequate for treatment, will proceed for cycle oxaliplatin infusion today, he started Xeloda this morning. - Call next Monday to check on chemotherapy adherence and side effects - Schedule next infusion for October 03, 2023.         SUMMARY OF ONCOLOGIC HISTORY: Oncology History  Cancer of sigmoid colon (HCC)  05/21/2023 Imaging   CT ABDOMEN PELVIS W CONTRAST   IMPRESSION: 1. Sigmoid colon bowel wall thickening may reflect Duane colonic mass or focal diverticulitis. Multiple rim enhancing fluid collections in the abdomen or pelvis likely reflect abscesses and suggest bowel perforation due to the colonic mass/focal diverticulitis. 2. Multiple loops of dilated small bowel with Duane possible transition to decompressed small bowel in the lower right abdomen. This may reflect ileus or small-bowel obstruction and is likely related to the multiple abdominal abscesses/intraperitoneal inflammatory process.   06/03/2023 Imaging   CT ABDOMEN PELVIS W CONTRAST    IMPRESSION: 1. Interval placement of midline anterior and left transgluteal approach pelvic drains. The abscesses have resolved. 2. Masslike thickening of the sigmoid colon remains highly suspicious for primary neoplasm. 3. Soft tissue nodular implant in the right lower quadrant measuring 1.5 cm is suspicious for metastatic implant.     06/08/2023 Pathology Results   Diagnosis 1. Colon, polyp(s), transverse and ascending (3) - TUBULAR ADENOMA, FRAGMENTS. 2. Colon, polyp(s), sigmoid and descending (2) - TUBULOVILLOUS/TUBULAR ADENOMA, FRAGMENTS. 3. 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 NOTE: IT IS NOTED THAT THIS IS SUBMITTED AS Duane MASS. WHILE THERE IS FOCAL SPLAYING OF SMOOTH MUSCLE DEFINITIVE  EVIDENCE OF INVASION INCLUDING DESMOPLASIA OR THE PRESENCE OF SUBMUCOSAL TISSUE IS NOT PRESENT IN THESE SUPERFICIAL BIOPSIES. CLINICAL/ENDOSCOPIC  CORRELATION NECESSARY. 4. Rectum, polyp(s), (2) - TUBULOVILLOUS ADENOMA, FRAGMENTS.   06/30/2023 PET scan   NM PET IMAGE initial (PI) SKULL BASE TO THIGH  IMPRESSION: 1. Hypermetabolic mass in the proximal sigmoid colon consistent with primary colorectal carcinoma. 2. No evidence of metastatic adenopathy or peritoneal metastasis. No liver metastasis or distant metastasis   07/10/2023 Initial Diagnosis   Cancer of sigmoid colon [facility])    Imaging     08/15/2023 Cancer Staging   Staging form: Colon and Rectum, AJCC 8th Edition - Pathologic stage from 08/15/2023: Stage IIC (pT4b, pN0, cM0) - Signed by Malachy Mood, MD on 08/30/2023 Total positive nodes: 0 Histologic grading system: 4 grade system Histologic grade (G): G2 Residual tumor (R): R0 - None   09/14/2023 -  Chemotherapy   Patient is on Treatment Plan : COLORECTAL Xelox (Capeox)(130/850) q21d        Discussed the use of AI scribe software for clinical note transcription with the patient, who gave verbal consent to proceed.  History of Present Illness   Duane [name], Duane 62 year old [name] with Duane history of sigmoid colon cancer, is about to start chemotherapy. He attended Duane chemo class and has no questions about the intravenous chemo. He was reminded about the need to avoid cold food and drinks after chemo as it could irritate his mouth and throat. He was also instructed on how to use his nausea medications, Aldenatrol and Compazin, which are to be taken as needed for nausea. He was advised to contact the medical team if he experiences fever, chills, dehydration, dizziness, or diarrhea. He started taking oral chemo, capecitabine, which he takes for 14 days followed by Duane week off. He uses Duane pill box to manage his medication. He is currently staying with Duane friend who can assist him during his  treatment. He has transportation needs for his future appointments and has financial concerns related to his treatment.         All other systems were reviewed with the patient and are negative.  MEDICAL HISTORY:  Past Medical History:  Diagnosis Date   Colon cancer (HCC)    Diabetes mellitus without complication (HCC) type 2    GERD (gastroesophageal reflux disease)    Hypertension     SURGICAL HISTORY: Past Surgical History:  Procedure Laterality Date   COLONOSCOPY     FLEXIBLE SIGMOIDOSCOPY N/Duane 08/15/2023   Procedure: FLEXIBLE SIGMOIDOSCOPY;  Surgeon: Andria Meuse, MD;  Location: [facility];  Service: General;  Laterality: N/Duane;   IR RADIOLOGIST EVAL & MGMT  06/03/2023   LAPAROSCOPY N/Duane 08/15/2023   Procedure: LAPAROSCOPY DIAGNOSTIC;  Surgeon: Andria Meuse, MD;  Location: [facility];  Service: General;  Laterality: N/Duane;   XI ROBOTIC ASSISTED LOWER ANTERIOR RESECTION N/Duane 08/15/2023   Procedure: XI ROBOTIC ASSISTED LOWER ANTERIOR RESECTION, SMALL BOWEL RESECTION, LYSIS OF ADHESIONS, AND INTRAOPERATIVE ASSESSMENT OF PERFUSION USING ICG DYE;  Surgeon: Andria Meuse, MD;  Location: [facility];  Service: General;  Laterality: N/Duane;    I have reviewed the social history and family history with the patient and they are unchanged from previous note.  ALLERGIES:  is allergic to codeine.  MEDICATIONS:  Current Outpatient Medications  Medication Sig Dispense Refill   amLODipine (NORVASC) 10 MG tablet Take 1 tablet (10 mg total) by mouth daily. 90 tablet 1   capecitabine (XELODA) 500 MG tablet Take 3 tablets (1500 mg total) by mouth in morning and 4 tablets (2000 mg total) in evening. Take within 30 minutes  after meals. Take for 14 days, then off for 7 days. Repeat every 21 days. 98 tablet 0   diclofenac Sodium (VOLTAREN) 1 % GEL Research Patient: Apply 0.5 grams (1 fingertip) to each hand and each foot twice daily for up to 12 weeks 400 g 0   lidocaine-prilocaine (EMLA) cream  Apply to affected area once 30 g 3   metFORMIN (GLUCOPHAGE) 500 MG tablet Take 1 tablet (500 mg total) by mouth 2 (two) times daily with Duane meal. 180 tablet 1   ondansetron (ZOFRAN) 8 MG tablet Take 1 tablet (8 mg total) by mouth every 8 (eight) hours as needed for nausea or vomiting. Start on the third day after chemotherapy. 30 tablet 1   pravastatin (PRAVACHOL) 20 MG tablet Take 1 tablet by mouth once daily 90 tablet 0   prochlorperazine (COMPAZINE) 10 MG tablet Take 1 tablet (10 mg total) by mouth every 6 (six) hours as needed for nausea or vomiting. (Patient not taking: Reported on 09/05/2023) 30 tablet 1   traMADol (ULTRAM) 50 MG tablet Take 1 tablet (50 mg total) by mouth every 6 (six) hours as needed for up to 5 days (postop pain not controlled with tylenol and ibuprofen first). 5 tablet 0   No current facility-administered medications for this visit.    PHYSICAL EXAMINATION: ECOG PERFORMANCE STATUS: 0 - Asymptomatic  Vitals:   09/12/23 1116  BP: 117/74  Pulse: 64  Resp: 16  Temp: 98 F (36.7 C)  SpO2: 100%   Wt Readings from Last 3 Encounters:  09/12/23 205 lb 12.8 oz (93.4 kg)  09/09/23 202 lb (91.6 kg)  08/31/23 203 lb 4.8 oz (92.2 kg)     GENERAL:alert, no distress and comfortable SKIN: skin color, texture, turgor are normal, no rashes or significant lesions EYES: normal, Conjunctiva are pink and non-injected, sclera clear NECK: supple, thyroid normal size, non-tender, without nodularity LYMPH:  no palpable lymphadenopathy in the cervical, axillary  LUNGS: clear to auscultation and percussion with normal breathing effort HEART: regular rate & rhythm and no murmurs and no lower extremity edema ABDOMEN:abdomen soft, non-tender and normal bowel sounds Musculoskeletal:no cyanosis of digits and no clubbing  NEURO: alert & oriented x 3 with fluent speech, no focal motor/sensory deficits    LABORATORY DATA:  I have reviewed the data as listed    Latest Ref Rng & Units  09/12/2023   10:54 AM 09/09/2023    9:30 AM 08/31/2023   10:35 AM  CBC  WBC 4.0 - 10.5 K/uL 6.4   5.6   Hemoglobin 13.0 - 17.0 g/dL 16.1  09.6  04.5   Hematocrit 39.0 - 52.0 % 37.2  39.0  37.4   Platelets 150 - 400 K/uL 215   296         Latest Ref Rng & Units 09/09/2023    9:30 AM 08/31/2023   10:35 AM 08/17/2023    4:34 AM  CMP  Glucose 70 - 99 mg/dL 409  811  914   BUN 8 - 23 mg/dL 9  8  11    Creatinine 0.61 - 1.24 mg/dL 7.82  9.56  2.13   Sodium 135 - 145 mmol/L 141  140  138   Potassium 3.5 - 5.1 mmol/L 3.8  4.3  3.6   Chloride 98 - 111 mmol/L 104  107  103   CO2 22 - 32 mmol/L  30  28   Calcium 8.9 - 10.3 mg/dL  9.5  8.2   Total Protein 6.5 -  8.1 g/dL  7.5    Total Bilirubin 0.3 - 1.2 mg/dL  0.9    Alkaline Phos 38 - 126 U/L  50    AST 15 - 41 U/L  14    ALT 0 - 44 U/L  14        RADIOGRAPHIC STUDIES: I have personally reviewed the radiological images as listed and agreed with the findings in the report. No results found.    Orders Placed This Encounter  Procedures   CBC with Differential (Cancer Center Only)    Standing Status:   Future    Standing Expiration Date:   10/25/2024   CMP (Cancer Center only)    Standing Status:   Future    Standing Expiration Date:   10/25/2024   CBC with Differential (Cancer Center Only)    Standing Status:   Future    Standing Expiration Date:   11/15/2024   CMP (Cancer Center only)    Standing Status:   Future    Standing Expiration Date:   11/15/2024   All questions were answered. The patient knows to call the clinic with any problems, questions or concerns. No barriers to learning was detected. The total time spent in the appointment was 25 minutes.     Malachy Mood, MD 09/12/2023

## 2023-09-12 NOTE — Patient Instructions (Signed)
 [facility] - A DEPT OF [facility]  Discharge Instructions: Thank you for choosing [facility] to provide your oncology and hematology care.   If you have a lab appointment with the Cancer Center, please go directly to the Cancer Center and check in at the registration area.   Wear comfortable clothing and clothing appropriate for easy access to any Portacath or PICC line.   We strive to give you quality time with your provider. You may need to reschedule your appointment if you arrive late (15 or more minutes).  Arriving late affects you and other patients whose appointments are after yours.  Also, if you miss three or more appointments without notifying the office, you may be dismissed from the clinic at the provider's discretion.      For prescription refill requests, have your pharmacy contact our office and allow 72 hours for refills to be completed.    Today you received the following chemotherapy and/or immunotherapy agents Oxaliplatin.      To help prevent nausea and vomiting after your treatment, we encourage you to take your nausea medication as directed.  BELOW ARE SYMPTOMS THAT SHOULD BE REPORTED IMMEDIATELY: *FEVER GREATER THAN 100.4 F (38 C) OR HIGHER *CHILLS OR SWEATING *NAUSEA AND VOMITING THAT IS NOT CONTROLLED WITH YOUR NAUSEA MEDICATION *UNUSUAL SHORTNESS OF BREATH *UNUSUAL BRUISING OR BLEEDING *URINARY PROBLEMS (pain or burning when urinating, or frequent urination) *BOWEL PROBLEMS (unusual diarrhea, constipation, pain near the anus) TENDERNESS IN MOUTH AND THROAT WITH OR WITHOUT PRESENCE OF ULCERS (sore throat, sores in mouth, or a toothache) UNUSUAL RASH, SWELLING OR PAIN  UNUSUAL VAGINAL DISCHARGE OR ITCHING   Items with * indicate a potential emergency and should be followed up as soon as possible or go to the Emergency Department if any problems should occur.  Please show the CHEMOTHERAPY ALERT CARD or  IMMUNOTHERAPY ALERT CARD at check-in to the Emergency Department and triage nurse.  Should you have questions after your visit or need to cancel or reschedule your appointment, please contact [facility] - A DEPT OF [facility]  Dept: [phone]  and follow the prompts.  Office hours are 8:00 a.m. to 4:30 p.m. Monday - Friday. Please note that voicemails left after 4:00 p.m. may not be returned until the following business day.  We are closed weekends and major holidays. You have access to a nurse at all times for urgent questions. Please call the main number to the clinic Dept: [phone] and follow the prompts.   For any non-urgent questions, you may also contact your provider using MyChart. We now offer e-Visits for anyone 22 and older to request care online for non-urgent symptoms. For details visit [email or web address].   Also download the MyChart app! Go to the app store, search "MyChart", open the app, select , and log in with your MyChart username and password.

## 2023-09-12 NOTE — Progress Notes (Signed)
Verbal order w/readback from Dr. Mosetta Putt for referral to Social Work and to be setup w/hospital transportation if possible.  Email sent to [employer] for hospital transportation setup if pt qualifies.

## 2023-09-13 ENCOUNTER — Inpatient Hospital Stay: Payer: Commercial Managed Care - HMO | Admitting: Licensed Clinical Social Worker

## 2023-09-13 ENCOUNTER — Other Ambulatory Visit: Payer: Self-pay

## 2023-09-13 ENCOUNTER — Encounter (HOSPITAL_BASED_OUTPATIENT_CLINIC_OR_DEPARTMENT_OTHER): Payer: Self-pay | Admitting: Surgery

## 2023-09-13 ENCOUNTER — Telehealth: Payer: Self-pay

## 2023-09-13 DIAGNOSIS — C187 Malignant neoplasm of sigmoid colon: Secondary | ICD-10-CM

## 2023-09-13 NOTE — Telephone Encounter (Signed)
LM for patient that this nurse was calling to see how they were doing after their treatment. Please call back to Dr.  Feng's nurse at [phone] if they have any questions or concerns regarding the treatment. 

## 2023-09-13 NOTE — Progress Notes (Signed)
[facility] Clinical Social Work  Initial Assessment   Duane [name] is a 62 y.o. year old male contacted by phone. Clinical Social Work was referred by medical provider for assessment of psychosocial needs.   SDOH (Social Determinants of Health) assessments performed: Yes   SDOH Screenings   Food Insecurity: No Food Insecurity (08/15/2023)  Housing: Low Risk  (08/15/2023)  Transportation Needs: No Transportation Needs (08/15/2023)  Utilities: Not At Risk (08/15/2023)  Depression (PHQ2-9): Low Risk  (06/13/2023)  Tobacco Use: Medium Risk (09/12/2023)     Distress Screen completed: No     No data to display            Family/Social Information:  Housing Arrangement: patient lives alone but is staying with a friend while undergoing treatment Duane [name]). Family members/support persons in your life? Pt has very limited support, reporting his friend Duane [name] is his primary support.  Duane [name] does not drive.  Transportation concerns: yes  Employment: Retired pt has taken early retirement and will start receiving social security retirement in January.  Income source: Supported by [employer] and Friends Financial concerns:  pt reports no current concerns Type of concern: Medical bills and None Food access concerns: no Religious or spiritual practice: No Services Currently in place:  pt receives food stamps  Coping/ Adjustment to diagnosis: Patient understands treatment plan and what happens next? yes Concerns about diagnosis and/or treatment: Overwhelmed by information and Quality of life Patient reported stressors: Adjusting to my illness Hopes and/or priorities: Pt's priority is to start treatment w/ the hope of positive results Patient enjoys  not discussed Current coping skills/ strengths: Motivation for treatment/growth     SUMMARY: Current SDOH Barriers:  Limited social support  Clinical Social Work Clinical Goal(s):  Explore community resource options for unmet needs  related to:  Social Connections  Interventions: Discussed common feeling and emotions when being diagnosed with cancer, and the importance of support during treatment Informed patient of the support team roles and support services at [facility] Provided CSW contact information and encouraged patient to call with any questions or concerns Provided patient with information about the [employer] and referred to the transportation department to assist w/ setting up Medicaid transport.    Follow Up Plan: Patient will contact CSW with any support or resource needs Patient verbalizes understanding of plan: Yes    [name], LCSW Clinical Social Worker [facility]  Patient is participating in a Managed Medicaid Plan:  Yes

## 2023-09-13 NOTE — Telephone Encounter (Signed)
-----   Message from Nurse Barbara Cower E sent at 09/12/2023  3:21 PM EST ----- Regarding: first time treatment- feng-oxaliplatin Patinet received treatment for the first time treatment. He is seen by dr Mosetta Putt. Received oxalipatin today with no issues.

## 2023-09-15 ENCOUNTER — Other Ambulatory Visit: Payer: Self-pay

## 2023-09-19 ENCOUNTER — Other Ambulatory Visit: Payer: Self-pay

## 2023-09-19 ENCOUNTER — Other Ambulatory Visit (HOSPITAL_COMMUNITY): Payer: Self-pay

## 2023-09-20 ENCOUNTER — Inpatient Hospital Stay (HOSPITAL_BASED_OUTPATIENT_CLINIC_OR_DEPARTMENT_OTHER): Payer: Commercial Managed Care - HMO | Admitting: Hematology

## 2023-09-20 ENCOUNTER — Other Ambulatory Visit: Payer: Self-pay

## 2023-09-20 DIAGNOSIS — C187 Malignant neoplasm of sigmoid colon: Secondary | ICD-10-CM | POA: Diagnosis not present

## 2023-09-20 MED ORDER — CAPECITABINE 500 MG PO TABS
ORAL_TABLET | ORAL | 1 refills | Status: DC
  Filled 2023-09-21: qty 98, 21d supply, fill #0
  Filled 2023-09-21: qty 98, fill #0
  Filled 2023-10-13: qty 98, 21d supply, fill #1

## 2023-09-20 NOTE — Progress Notes (Signed)
[facility]   Telephone:([phone] Fax:(336) [phone]   Clinic Follow up Note   Patient Care Team: [name], NP as PCP - General (Internal Medicine) Malachy Mood, MD as Consulting Physician (Hematology and Oncology) 09/20/2023  I connected with Randall An on 09/20/23 at  2:20 PM EST by telephone and verified that I am speaking with the correct person using two identifiers.   I discussed the limitations, risks, security and privacy concerns of performing an evaluation and management service by telephone and the availability of in person appointments. I also discussed with the patient that there may be a patient responsible charge related to this service. The patient expressed understanding and agreed to proceed.   Patient's location:  work  Provider's location:  Office    CHIEF COMPLAINT: f/u after chemo    CURRENT THERAPY: CAPOX   Oncology history Cancer of sigmoid colon (HCC) pT4bN0M0, MMR normal, G2 -Patient presented with perforated sigmoid colon and a contained abscess required IR drainage tube placement. -He underwent left hemicolectomy and partial small bowel resection.  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  All surgical margins were negative. -Due to the high risk of recurrence given T4b lesion and bowel perforation, I recommend adjuvant chemotherapy FOLFOX or CAPOX for 6 months if he can tolerate. He opted CAPOX and started on 09/12/2023   Assessment and Plan  Colon Cancer Follow-up for colon cancer. Currently on adjuvant chemotherapy with CAEPOX. Clinically well-managed with mild coalescent activity. No nausea, vomiting, or diarrhea. Tolerating oral chemotherapy well with no noticeable side effects. Reports fatigue from the chemotherapy cycle. Scheduled for cycle two of CAPOX in two weeks. Capecitabine prescription refilled today    SUMMARY OF  ONCOLOGIC HISTORY: Oncology History  Cancer of sigmoid colon (HCC)  05/21/2023 Imaging   CT ABDOMEN PELVIS W CONTRAST   IMPRESSION: 1. Sigmoid colon bowel wall thickening may reflect a colonic mass or focal diverticulitis. Multiple rim enhancing fluid collections in the abdomen or pelvis likely reflect abscesses and suggest bowel perforation due to the colonic mass/focal diverticulitis. 2. Multiple loops of dilated small bowel with a possible transition to decompressed small bowel in the lower right abdomen. This may reflect ileus or small-bowel obstruction and is likely related to the multiple abdominal abscesses/intraperitoneal inflammatory process.   06/03/2023 Imaging   CT ABDOMEN PELVIS W CONTRAST    IMPRESSION: 1. Interval placement of midline anterior and left transgluteal approach pelvic drains. The abscesses have resolved. 2. Masslike thickening of the sigmoid colon remains highly suspicious for primary neoplasm. 3. Soft tissue nodular implant in the right lower quadrant measuring 1.5 cm is suspicious for metastatic implant.     06/08/2023 Pathology Results   Diagnosis 1. Colon, polyp(s), transverse and ascending (3) - TUBULAR ADENOMA, FRAGMENTS. 2. Colon, polyp(s), sigmoid and descending (2) - TUBULOVILLOUS/TUBULAR ADENOMA, FRAGMENTS. 3. 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 NOTE: IT IS NOTED THAT THIS IS SUBMITTED AS A MASS. WHILE THERE IS FOCAL SPLAYING OF SMOOTH MUSCLE DEFINITIVE EVIDENCE OF INVASION INCLUDING DESMOPLASIA OR THE PRESENCE OF SUBMUCOSAL TISSUE IS NOT PRESENT IN THESE SUPERFICIAL BIOPSIES. CLINICAL/ENDOSCOPIC CORRELATION NECESSARY. 4. Rectum, polyp(s), (2) - TUBULOVILLOUS ADENOMA, FRAGMENTS.   06/30/2023 PET scan   NM PET IMAGE initial (PI) SKULL BASE TO THIGH  IMPRESSION: 1. Hypermetabolic mass in the proximal sigmoid colon consistent with primary colorectal carcinoma. 2. No  evidence of  metastatic adenopathy or peritoneal metastasis. No liver metastasis or distant metastasis   07/10/2023 Initial Diagnosis   Cancer of sigmoid colon [facility])    Imaging     08/15/2023 Cancer Staging   Staging form: Colon and Rectum, AJCC 8th Edition - Pathologic stage from 08/15/2023: Stage IIC (pT4b, pN0, cM0) - Signed by Malachy Mood, MD on 08/30/2023 Total positive nodes: 0 Histologic grading system: 4 grade system Histologic grade (G): G2 Residual tumor (R): R0 - None   09/12/2023 -  Chemotherapy   Patient is on Treatment Plan : COLORECTAL Xelox (Capeox)(130/850) q21d       Discussed the use of AI scribe software for clinical note transcription with the patient, who gave verbal consent to proceed.  History of Present Illness   A 62 year old patient with a history of colon cancer is currently on adjuvant chemotherapy (KPOX). He reports feeling tired during his treatment cycle but is otherwise doing well with mild coalescent activity. He denies experiencing any nausea, vomiting, or diarrhea. He is tolerating the oral chemotherapy (capecitabine) well and has not noticed any side effects.         REVIEW OF SYSTEMS:   Constitutional: Denies fevers, chills or abnormal weight loss Eyes: Denies blurriness of vision Ears, nose, mouth, throat, and face: Denies mucositis or sore throat Respiratory: Denies cough, dyspnea or wheezes Cardiovascular: Denies palpitation, chest discomfort or lower extremity swelling Gastrointestinal:  Denies nausea, heartburn or change in bowel habits Skin: Denies abnormal skin rashes Lymphatics: Denies new lymphadenopathy or easy bruising Neurological:Denies numbness, tingling or new weaknesses Behavioral/Psych: Mood is stable, no new changes  All other systems were reviewed with the patient and are negative.  MEDICAL HISTORY:  Past Medical History:  Diagnosis Date   Colon cancer (HCC)    Diabetes mellitus without complication (HCC) type 2     GERD (gastroesophageal reflux disease)    Hypertension     SURGICAL HISTORY: Past Surgical History:  Procedure Laterality Date   COLONOSCOPY     FLEXIBLE SIGMOIDOSCOPY N/A 08/15/2023   Procedure: FLEXIBLE SIGMOIDOSCOPY;  Surgeon: Andria Meuse, MD;  Location: [facility];  Service: General;  Laterality: N/A;   IR RADIOLOGIST EVAL & MGMT  06/03/2023   LAPAROSCOPY N/A 08/15/2023   Procedure: LAPAROSCOPY DIAGNOSTIC;  Surgeon: Andria Meuse, MD;  Location: [facility];  Service: General;  Laterality: N/A;   PORTACATH PLACEMENT Right 09/09/2023   Procedure: INSERTION PORT-A-CATH ULTRASOUND FLUOROSCOPY;  Surgeon: Andria Meuse, MD;  Location: [facility];  Service: General;  Laterality: Right;   XI ROBOTIC ASSISTED LOWER ANTERIOR RESECTION N/A 08/15/2023   Procedure: XI ROBOTIC ASSISTED LOWER ANTERIOR RESECTION, SMALL BOWEL RESECTION, LYSIS OF ADHESIONS, AND INTRAOPERATIVE ASSESSMENT OF PERFUSION USING ICG DYE;  Surgeon: Andria Meuse, MD;  Location: [facility];  Service: General;  Laterality: N/A;    I have reviewed the social history and family history with the patient and they are unchanged from previous note.  ALLERGIES:  is allergic to codeine.  MEDICATIONS:  Current Outpatient Medications  Medication Sig Dispense Refill   amLODipine (NORVASC) 10 MG tablet Take 1 tablet (10 mg total) by mouth daily. 90 tablet 1   capecitabine (XELODA) 500 MG tablet Take 3 tablets (1500 mg total) by mouth in morning and 4 tablets (2000 mg total) in evening. Take within 30 minutes after meals. Take for 14 days, then off for 7 days. Repeat every 21 days. 98 tablet 1   diclofenac Sodium (VOLTAREN) 1 % GEL Research Patient:  Apply 0.5 grams (1 fingertip) to each hand and each foot twice daily for up to 12 weeks 400 g 0   lidocaine-prilocaine (EMLA) cream Apply to affected area once 30 g 3   metFORMIN (GLUCOPHAGE) 500 MG tablet Take 1 tablet (500 mg total) by mouth 2 (two) times  daily with a meal. 180 tablet 1   ondansetron (ZOFRAN) 8 MG tablet Take 1 tablet (8 mg total) by mouth every 8 (eight) hours as needed for nausea or vomiting. Start on the third day after chemotherapy. 30 tablet 1   pravastatin (PRAVACHOL) 20 MG tablet Take 1 tablet by mouth once daily 90 tablet 0   prochlorperazine (COMPAZINE) 10 MG tablet Take 1 tablet (10 mg total) by mouth every 6 (six) hours as needed for nausea or vomiting. (Patient not taking: Reported on 09/05/2023) 30 tablet 1   No current facility-administered medications for this visit.    PHYSICAL EXAMINATION: Not performed   LABORATORY DATA:  I have reviewed the data as listed    Latest Ref Rng & Units 09/12/2023   10:54 AM 09/09/2023    9:30 AM 08/31/2023   10:35 AM  CBC  WBC 4.0 - 10.5 K/uL 6.4   5.6   Hemoglobin 13.0 - 17.0 g/dL 09.8  11.9  14.7   Hematocrit 39.0 - 52.0 % 37.2  39.0  37.4   Platelets 150 - 400 K/uL 215   296         Latest Ref Rng & Units 09/12/2023   10:54 AM 09/09/2023    9:30 AM 08/31/2023   10:35 AM  CMP  Glucose 70 - 99 mg/dL 829  562  130   BUN 8 - 23 mg/dL 9  9  8    Creatinine 0.61 - 1.24 mg/dL 8.65  7.84  6.96   Sodium 135 - 145 mmol/L 139  141  140   Potassium 3.5 - 5.1 mmol/L 3.7  3.8  4.3   Chloride 98 - 111 mmol/L 106  104  107   CO2 22 - 32 mmol/L 27   30   Calcium 8.9 - 10.3 mg/dL 9.4   9.5   Total Protein 6.5 - 8.1 g/dL 7.2   7.5   Total Bilirubin <1.2 mg/dL 0.9   0.9   Alkaline Phos 38 - 126 U/L 53   50   AST 15 - 41 U/L 14   14   ALT 0 - 44 U/L 13   14       RADIOGRAPHIC STUDIES: I have personally reviewed the radiological images as listed and agreed with the findings in the report. No results found.     I discussed the assessment and treatment plan with the patient. The patient was provided an opportunity to ask questions and all were answered. The patient agreed with the plan and demonstrated an understanding of the instructions.   The patient was advised to  call back or seek an in-person evaluation if the symptoms worsen or if the condition fails to improve as anticipated.  I provided 6 minutes of non face-to-face telephone visit time during this encounter, and > 50% was spent counseling as documented under my assessment & plan.     Malachy Mood, MD 09/20/23

## 2023-09-20 NOTE — Assessment & Plan Note (Signed)
pT4bN0M0, MMR normal, G2 -Patient presented with perforated sigmoid colon and a contained abscess required IR drainage tube placement. -He underwent left hemicolectomy and partial small bowel resection.  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  All surgical margins were negative. -Due to the high risk of recurrence given T4b lesion and bowel perforation, I recommend adjuvant chemotherapy FOLFOX or CAPOX for 6 months if he can tolerate. He opted CAPOX and started on 09/12/2023

## 2023-09-21 ENCOUNTER — Other Ambulatory Visit: Payer: Self-pay

## 2023-09-21 ENCOUNTER — Other Ambulatory Visit (HOSPITAL_COMMUNITY): Payer: Self-pay

## 2023-09-21 NOTE — Progress Notes (Signed)
Specialty Pharmacy Refill Coordination Note  Duane [name] is a 62 y.o. male contacted today regarding refills of specialty medication(s) Capecitabine   Patient requested Delivery   Delivery date: 09/27/23   Verified address: [address] [place], [place] [address]   Medication will be filled on 09/26/2023.

## 2023-09-26 ENCOUNTER — Other Ambulatory Visit: Payer: Self-pay

## 2023-09-27 ENCOUNTER — Encounter: Payer: Self-pay | Admitting: Hematology

## 2023-09-27 ENCOUNTER — Other Ambulatory Visit: Payer: Self-pay

## 2023-09-30 ENCOUNTER — Other Ambulatory Visit: Payer: Self-pay

## 2023-09-30 NOTE — Progress Notes (Signed)
Specialty Pharmacy Ongoing Clinical Assessment Note  Duane [name] is a 62 y.o. male who is being followed by the specialty pharmacy service for RxSp Oncology   Patient's specialty medication(s) reviewed today: Capecitabine   Missed doses in the last 4 weeks: 0   Patient/Caregiver did not have any additional questions or concerns.   Therapeutic benefit summary: Unable to assess   Adverse events/side effects summary: No adverse events/side effects   Patient's therapy is appropriate to: Continue    Goals Addressed             This Visit's Progress    Stabilization of disease       Patient is initiating therapy. Patient will be evaluated at upcoming provider appointment to assess progress         Follow up:  3 months  Bobette Mo Specialty Pharmacist

## 2023-10-01 NOTE — Assessment & Plan Note (Signed)
 pT4bN0M0, MMR normal, G2 -Patient presented with perforated sigmoid colon and a contained abscess required IR drainage tube placement. -He underwent left hemicolectomy and partial small bowel resection.  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  All surgical margins were negative. -Due to the high risk of recurrence given T4b lesion and bowel perforation, I recommend adjuvant chemotherapy FOLFOX or CAPOX for 6 months if he can tolerate. He opted CAPOX and started on 09/12/2023

## 2023-10-03 ENCOUNTER — Inpatient Hospital Stay: Payer: Commercial Managed Care - HMO

## 2023-10-03 ENCOUNTER — Encounter: Payer: Self-pay | Admitting: Hematology

## 2023-10-03 ENCOUNTER — Inpatient Hospital Stay: Payer: Commercial Managed Care - HMO | Attending: Hematology

## 2023-10-03 ENCOUNTER — Inpatient Hospital Stay (HOSPITAL_BASED_OUTPATIENT_CLINIC_OR_DEPARTMENT_OTHER): Payer: Commercial Managed Care - HMO | Admitting: Hematology

## 2023-10-03 VITALS — BP 117/72 | HR 66 | Temp 98.1°F | Resp 17 | Ht 74.0 in | Wt 210.2 lb

## 2023-10-03 DIAGNOSIS — Z5111 Encounter for antineoplastic chemotherapy: Secondary | ICD-10-CM | POA: Diagnosis present

## 2023-10-03 DIAGNOSIS — C187 Malignant neoplasm of sigmoid colon: Secondary | ICD-10-CM

## 2023-10-03 DIAGNOSIS — Z79899 Other long term (current) drug therapy: Secondary | ICD-10-CM | POA: Diagnosis not present

## 2023-10-03 DIAGNOSIS — D500 Iron deficiency anemia secondary to blood loss (chronic): Secondary | ICD-10-CM

## 2023-10-03 LAB — CBC WITH DIFFERENTIAL (CANCER CENTER ONLY)
Abs Immature Granulocytes: 0.02 10*3/uL (ref 0.00–0.07)
Basophils Absolute: 0 10*3/uL (ref 0.0–0.1)
Basophils Relative: 1 %
Eosinophils Absolute: 0.1 10*3/uL (ref 0.0–0.5)
Eosinophils Relative: 1 %
HCT: 36.6 % — ABNORMAL LOW (ref 39.0–52.0)
Hemoglobin: 13 g/dL (ref 13.0–17.0)
Immature Granulocytes: 0 %
Lymphocytes Relative: 34 %
Lymphs Abs: 1.6 10*3/uL (ref 0.7–4.0)
MCH: 29.4 pg (ref 26.0–34.0)
MCHC: 35.5 g/dL (ref 30.0–36.0)
MCV: 82.8 fL (ref 80.0–100.0)
Monocytes Absolute: 0.5 10*3/uL (ref 0.1–1.0)
Monocytes Relative: 11 %
Neutro Abs: 2.5 10*3/uL (ref 1.7–7.7)
Neutrophils Relative %: 53 %
Platelet Count: 210 10*3/uL (ref 150–400)
RBC: 4.42 MIL/uL (ref 4.22–5.81)
RDW: 16.6 % — ABNORMAL HIGH (ref 11.5–15.5)
WBC Count: 4.6 10*3/uL (ref 4.0–10.5)
nRBC: 0 % (ref 0.0–0.2)

## 2023-10-03 LAB — CMP (CANCER CENTER ONLY)
ALT: 13 U/L (ref 0–44)
AST: 15 U/L (ref 15–41)
Albumin: 3.8 g/dL (ref 3.5–5.0)
Alkaline Phosphatase: 57 U/L (ref 38–126)
Anion gap: 6 (ref 5–15)
BUN: 8 mg/dL (ref 8–23)
CO2: 27 mmol/L (ref 22–32)
Calcium: 9.1 mg/dL (ref 8.9–10.3)
Chloride: 107 mmol/L (ref 98–111)
Creatinine: 0.9 mg/dL (ref 0.61–1.24)
GFR, Estimated: >60 mL/min (ref >60)
Glucose, Bld: 89 mg/dL (ref 70–99)
Potassium: 3.7 mmol/L (ref 3.5–5.1)
Sodium: 140 mmol/L (ref 135–145)
Total Bilirubin: 1.1 mg/dL (ref <1.2)
Total Protein: 6.8 g/dL (ref 6.5–8.1)

## 2023-10-03 LAB — CEA (IN HOUSE-CHCC): CEA (CHCC-In House): 3.2 ng/mL (ref 0.00–5.00)

## 2023-10-03 LAB — FERRITIN: Ferritin: 23 ng/mL — ABNORMAL LOW (ref 24–336)

## 2023-10-03 MED ORDER — SODIUM CHLORIDE 0.9% FLUSH
10.0000 mL | INTRAVENOUS | Status: DC | PRN
  Administered 2023-10-03: 10 mL

## 2023-10-03 MED ORDER — PALONOSETRON HCL INJECTION 0.25 MG/5ML
0.2500 mg | Freq: Once | INTRAVENOUS | Status: AC
  Administered 2023-10-03: 0.25 mg via INTRAVENOUS
  Filled 2023-10-03: qty 5

## 2023-10-03 MED ORDER — HEPARIN SOD (PORK) LOCK FLUSH 100 UNIT/ML IV SOLN
500.0000 [IU] | Freq: Once | INTRAVENOUS | Status: AC | PRN
  Administered 2023-10-03: 500 [IU]

## 2023-10-03 MED ORDER — OXALIPLATIN CHEMO INJECTION 100 MG/20ML
130.0000 mg/m2 | Freq: Once | INTRAVENOUS | Status: AC
  Administered 2023-10-03: 300 mg via INTRAVENOUS
  Filled 2023-10-03: qty 60

## 2023-10-03 MED ORDER — DEXAMETHASONE SODIUM PHOSPHATE 10 MG/ML IJ SOLN
10.0000 mg | Freq: Once | INTRAMUSCULAR | Status: AC
  Administered 2023-10-03: 10 mg via INTRAVENOUS
  Filled 2023-10-03: qty 1

## 2023-10-03 MED ORDER — DEXTROSE 5 % IV SOLN: INTRAVENOUS | Status: DC

## 2023-10-03 NOTE — Progress Notes (Signed)
[facility]   Telephone:(336) [phone] Fax:(336) [phone]   Clinic Follow up Note   Patient Care Team: [name], NP as PCP - General (Internal Medicine) Malachy Mood, MD as Consulting Physician (Hematology and Oncology)  Date of Service:  10/03/2023  CHIEF COMPLAINT: f/u of colon cancer  CURRENT THERAPY:  Adjuvant CapeOx  Oncology History   Cancer of sigmoid colon (HCC) pT4bN0M0, MMR normal, G2 -Patient presented with perforated sigmoid colon and a contained abscess required IR drainage tube placement. -He underwent left hemicolectomy and partial small bowel resection.  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  All surgical margins were negative. -Due to the high risk of recurrence given T4b lesion and bowel perforation, I recommend adjuvant chemotherapy FOLFOX or CAPOX for 6 months if he can tolerate. He opted CAPOX and started on 09/12/2023    Assessment and Plan    Colon Cancer 62 year old with colon cancer, completed one cycle chemotherapy, reports good energy levels. Started current cycle of chemotherapy pills today. Blood counts are good, cleared for treatment. Experienced mild, manageable cold sensitivity post-treatment. Discussed importance of refilling chemotherapy pills after the current 14-day cycle and scheduling future appointments. No new concerns. - Proceed with chemotherapy treatment today - Schedule next appointment in three weeks With Chemotherapy-Induced Cold Sensitivity Mild cold sensitivity post-chemotherapy, resolved within a few days. - Monitor for cold sensitivity in future cycles  General Health Maintenance No new concerns. Skin examination unremarkable. Discussed keeping Voltaren gel at home for potential skin redness on palms and soles. - Keep Voltaren gel at home for potential skin redness on palms and soles  Plan -Lab reviewed, adequate  for treatment, will proceed to cycle 2 oxaliplatin, and continue capecitabine for 14 days - Schedule next appointment for October 24, 2023       SUMMARY OF ONCOLOGIC HISTORY: Oncology History  Cancer of sigmoid colon (HCC)  05/21/2023 Imaging   CT ABDOMEN PELVIS W CONTRAST   IMPRESSION: 1. Sigmoid colon bowel wall thickening may reflect a colonic mass or focal diverticulitis. Multiple rim enhancing fluid collections in the abdomen or pelvis likely reflect abscesses and suggest bowel perforation due to the colonic mass/focal diverticulitis. 2. Multiple loops of dilated small bowel with a possible transition to decompressed small bowel in the lower right abdomen. This may reflect ileus or small-bowel obstruction and is likely related to the multiple abdominal abscesses/intraperitoneal inflammatory process.   06/03/2023 Imaging   CT ABDOMEN PELVIS W CONTRAST    IMPRESSION: 1. Interval placement of midline anterior and left transgluteal approach pelvic drains. The abscesses have resolved. 2. Masslike thickening of the sigmoid colon remains highly suspicious for primary neoplasm. 3. Soft tissue nodular implant in the right lower quadrant measuring 1.5 cm is suspicious for metastatic implant.     06/08/2023 Pathology Results   Diagnosis 1. Colon, polyp(s), transverse and ascending (3) - TUBULAR ADENOMA, FRAGMENTS. 2. Colon, polyp(s), sigmoid and descending (2) - TUBULOVILLOUS/TUBULAR ADENOMA, FRAGMENTS. 3. 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 NOTE: IT IS NOTED THAT THIS IS SUBMITTED AS A MASS. WHILE THERE IS FOCAL SPLAYING OF SMOOTH MUSCLE DEFINITIVE EVIDENCE OF INVASION INCLUDING DESMOPLASIA OR THE PRESENCE OF SUBMUCOSAL TISSUE IS NOT PRESENT IN THESE SUPERFICIAL BIOPSIES. CLINICAL/ENDOSCOPIC CORRELATION NECESSARY. 4. Rectum, polyp(s), (2) - TUBULOVILLOUS ADENOMA, FRAGMENTS.   06/30/2023 PET scan   NM PET  IMAGE initial (PI) SKULL BASE TO THIGH  IMPRESSION: 1. Hypermetabolic mass in the proximal sigmoid colon consistent with primary colorectal carcinoma. 2. No evidence of metastatic adenopathy or peritoneal metastasis. No liver metastasis or distant metastasis   07/10/2023 Initial Diagnosis   Cancer of sigmoid colon [facility])    Imaging     08/15/2023 Cancer Staging   Staging form: Colon and Rectum, AJCC 8th Edition - Pathologic stage from 08/15/2023: Stage IIC (pT4b, pN0, cM0) - Signed by Malachy Mood, MD on 08/30/2023 Total positive nodes: 0 Histologic grading system: 4 grade system Histologic grade (G): G2 Residual tumor (R): R0 - None   09/12/2023 -  Chemotherapy   Patient is on Treatment Plan : COLORECTAL Xelox (Capeox)(130/850) q21d        Discussed the use of AI scribe software for clinical note transcription with the patient, who gave verbal consent to proceed.  History of Present Illness   A 62 year old patient with a history of colon cancer presents for a follow-up visit. He reports feeling close to normal after undergoing chemotherapy. He denies any fatigue or other concerns. He has started a new cycle of chemotherapy pills and reports no issues with refilling his medication. He experienced mild cold sensitivity for a few days after his last chemotherapy session but reports that it has since resolved. He denies any other symptoms or concerns.         All other systems were reviewed with the patient and are negative.  MEDICAL HISTORY:  Past Medical History:  Diagnosis Date   Colon cancer (HCC)    Diabetes mellitus without complication (HCC) type 2    GERD (gastroesophageal reflux disease)    Hypertension     SURGICAL HISTORY: Past Surgical History:  Procedure Laterality Date   COLONOSCOPY     FLEXIBLE SIGMOIDOSCOPY N/A 08/15/2023   Procedure: FLEXIBLE SIGMOIDOSCOPY;  Surgeon: Andria Meuse, MD;  Location: [facility];  Service: General;  Laterality: N/A;   IR  RADIOLOGIST EVAL & MGMT  06/03/2023   LAPAROSCOPY N/A 08/15/2023   Procedure: LAPAROSCOPY DIAGNOSTIC;  Surgeon: Andria Meuse, MD;  Location: [facility];  Service: General;  Laterality: N/A;   PORTACATH PLACEMENT Right 09/09/2023   Procedure: INSERTION PORT-A-CATH ULTRASOUND FLUOROSCOPY;  Surgeon: Andria Meuse, MD;  Location: [facility];  Service: General;  Laterality: Right;   XI ROBOTIC ASSISTED LOWER ANTERIOR RESECTION N/A 08/15/2023   Procedure: XI ROBOTIC ASSISTED LOWER ANTERIOR RESECTION, SMALL BOWEL RESECTION, LYSIS OF ADHESIONS, AND INTRAOPERATIVE ASSESSMENT OF PERFUSION USING ICG DYE;  Surgeon: Andria Meuse, MD;  Location: [facility];  Service: General;  Laterality: N/A;    I have reviewed the social history and family history with the patient and they are unchanged from previous note.  ALLERGIES:  is allergic to codeine.  MEDICATIONS:  Current Outpatient Medications  Medication Sig Dispense Refill   amLODipine (NORVASC) 10 MG tablet Take 1 tablet (10 mg total) by mouth daily. 90 tablet 1   capecitabine (XELODA) 500 MG tablet Take 3 tablets (1500 mg total) by mouth in morning and 4 tablets (2000 mg total) in evening. Take within 30 minutes after meals. Take for 14 days, then off for 7 days. Repeat every 21 days. 98 tablet 1   diclofenac Sodium (VOLTAREN) 1 % GEL Research Patient: Apply 0.5 grams (1 fingertip) to each hand and each foot twice daily for up to 12 weeks 400 g 0   lidocaine-prilocaine (EMLA) cream Apply to affected area once 30 g 3   metFORMIN (GLUCOPHAGE) 500 MG tablet  Take 1 tablet (500 mg total) by mouth 2 (two) times daily with a meal. 180 tablet 1   ondansetron (ZOFRAN) 8 MG tablet Take 1 tablet (8 mg total) by mouth every 8 (eight) hours as needed for nausea or vomiting. Start on the third day after chemotherapy. 30 tablet 1   pravastatin (PRAVACHOL) 20 MG tablet Take 1 tablet by mouth once daily 90 tablet 0   prochlorperazine  (COMPAZINE) 10 MG tablet Take 1 tablet (10 mg total) by mouth every 6 (six) hours as needed for nausea or vomiting. (Patient not taking: Reported on 09/05/2023) 30 tablet 1   No current facility-administered medications for this visit.    PHYSICAL EXAMINATION: ECOG PERFORMANCE STATUS: 0 - Asymptomatic  Vitals:   10/03/23 1006  BP: 117/72  Pulse: 66  Resp: 17  Temp: 98.1 F (36.7 C)  SpO2: 99%   Wt Readings from Last 3 Encounters:  10/03/23 210 lb 3.2 oz (95.3 kg)  09/12/23 205 lb 12.8 oz (93.4 kg)  09/09/23 202 lb (91.6 kg)     GENERAL:alert, no distress and comfortable SKIN: skin color, texture, turgor are normal, no rashes or significant lesions EYES: normal, Conjunctiva are pink and non-injected, sclera clear NECK: supple, thyroid normal size, non-tender, without nodularity LYMPH:  no palpable lymphadenopathy in the cervical, axillary  LUNGS: clear to auscultation and percussion with normal breathing effort HEART: regular rate & rhythm and no murmurs and no lower extremity edema ABDOMEN:abdomen soft, non-tender and normal bowel sounds Musculoskeletal:no cyanosis of digits and no clubbing  NEURO: alert & oriented x 3 with fluent speech, no focal motor/sensory deficits   LABORATORY DATA:  I have reviewed the data as listed    Latest Ref Rng & Units 10/03/2023    9:39 AM 09/12/2023   10:54 AM 09/09/2023    9:30 AM  CBC  WBC 4.0 - 10.5 K/uL 4.6  6.4    Hemoglobin 13.0 - 17.0 g/dL 16.1  09.6  04.5   Hematocrit 39.0 - 52.0 % 36.6  37.2  39.0   Platelets 150 - 400 K/uL 210  215          Latest Ref Rng & Units 10/03/2023    9:39 AM 09/12/2023   10:54 AM 09/09/2023    9:30 AM  CMP  Glucose 70 - 99 mg/dL 89  409  811   BUN 8 - 23 mg/dL 8  9  9    Creatinine 0.61 - 1.24 mg/dL 9.14  7.82  9.56   Sodium 135 - 145 mmol/L 140  139  141   Potassium 3.5 - 5.1 mmol/L 3.7  3.7  3.8   Chloride 98 - 111 mmol/L 107  106  104   CO2 22 - 32 mmol/L 27  27    Calcium 8.9 - 10.3  mg/dL 9.1  9.4    Total Protein 6.5 - 8.1 g/dL 6.8  7.2    Total Bilirubin <1.2 mg/dL 1.1  0.9    Alkaline Phos 38 - 126 U/L 57  53    AST 15 - 41 U/L 15  14    ALT 0 - 44 U/L 13  13        RADIOGRAPHIC STUDIES: I have personally reviewed the radiological images as listed and agreed with the findings in the report. No results found.    No orders of the defined types were placed in this encounter.  All questions were answered. The patient knows to call the clinic with any  problems, questions or concerns. No barriers to learning was detected. The total time spent in the appointment was 25 minutes.     Malachy Mood, MD 10/03/2023

## 2023-10-03 NOTE — Patient Instructions (Signed)
[facility]  Discharge Instructions: Thank you for choosing [facility] to provide your oncology and hematology care.   If you have a lab appointment with the Cancer Center, please go directly to the Cancer Center and check in at the registration area.   Wear comfortable clothing and clothing appropriate for easy access to any Portacath or PICC line.   We strive to give you quality time with your provider. You may need to reschedule your appointment if you arrive late (15 or more minutes).  Arriving late affects you and other patients whose appointments are after yours.  Also, if you miss three or more appointments without notifying the office, you may be dismissed from the clinic at the provider's discretion.      For prescription refill requests, have your pharmacy contact our office and allow 72 hours for refills to be completed.    Today you received the following chemotherapy and/or immunotherapy agents: Oxaliplatin.       To help prevent nausea and vomiting after your treatment, we encourage you to take your nausea medication as directed.  BELOW ARE SYMPTOMS THAT SHOULD BE REPORTED IMMEDIATELY: *FEVER GREATER THAN 100.4 F (38 C) OR HIGHER *CHILLS OR SWEATING *NAUSEA AND VOMITING THAT IS NOT CONTROLLED WITH YOUR NAUSEA MEDICATION *UNUSUAL SHORTNESS OF BREATH *UNUSUAL BRUISING OR BLEEDING *URINARY PROBLEMS (pain or burning when urinating, or frequent urination) *BOWEL PROBLEMS (unusual diarrhea, constipation, pain near the anus) TENDERNESS IN MOUTH AND THROAT WITH OR WITHOUT PRESENCE OF ULCERS (sore throat, sores in mouth, or a toothache) UNUSUAL RASH, SWELLING OR PAIN  UNUSUAL VAGINAL DISCHARGE OR ITCHING   Items with * indicate a potential emergency and should be followed up as soon as possible or go to the Emergency Department if any problems should occur.  Please show the CHEMOTHERAPY ALERT CARD or  IMMUNOTHERAPY ALERT CARD at check-in to the Emergency Department and triage nurse.  Should you have questions after your visit or need to cancel or reschedule your appointment, please contact [facility]  Dept: [phone]  and follow the prompts.  Office hours are 8:00 a.m. to 4:30 p.m. Monday - Friday. Please note that voicemails left after 4:00 p.m. may not be returned until the following business day.  We are closed weekends and major holidays. You have access to a nurse at all times for urgent questions. Please call the main number to the clinic Dept: [phone] and follow the prompts.   For any non-urgent questions, you may also contact your provider using MyChart. We now offer e-Visits for anyone 68 and older to request care online for non-urgent symptoms. For details visit [email or web address].   Also download the MyChart app! Go to the app store, search "MyChart", open the app, select [place], and log in with your MyChart username and password.

## 2023-10-04 ENCOUNTER — Other Ambulatory Visit: Payer: Self-pay

## 2023-10-04 ENCOUNTER — Other Ambulatory Visit (INDEPENDENT_AMBULATORY_CARE_PROVIDER_SITE_OTHER): Payer: Self-pay | Admitting: Primary Care

## 2023-10-04 DIAGNOSIS — E119 Type 2 diabetes mellitus without complications: Secondary | ICD-10-CM

## 2023-10-05 ENCOUNTER — Other Ambulatory Visit: Payer: Self-pay

## 2023-10-05 ENCOUNTER — Other Ambulatory Visit (INDEPENDENT_AMBULATORY_CARE_PROVIDER_SITE_OTHER): Payer: Self-pay | Admitting: Primary Care

## 2023-10-06 ENCOUNTER — Other Ambulatory Visit (HOSPITAL_COMMUNITY): Payer: Self-pay

## 2023-10-11 ENCOUNTER — Other Ambulatory Visit (HOSPITAL_COMMUNITY): Payer: Self-pay

## 2023-10-12 ENCOUNTER — Encounter: Payer: Self-pay | Admitting: Hematology

## 2023-10-13 ENCOUNTER — Other Ambulatory Visit (HOSPITAL_COMMUNITY): Payer: Self-pay | Admitting: Pharmacy Technician

## 2023-10-13 ENCOUNTER — Encounter: Payer: Self-pay | Admitting: Hematology

## 2023-10-13 ENCOUNTER — Other Ambulatory Visit (HOSPITAL_COMMUNITY): Payer: Self-pay

## 2023-10-13 NOTE — Progress Notes (Signed)
Specialty Pharmacy Refill Coordination Note  Duane [name] is a 62 y.o. male contacted today regarding refills of specialty medication(s) Capecitabine (XELODA)   Patient requested Delivery   Delivery date: 10/20/23   Verified address: [address] [name], [place]   Medication will be filled on 10/19/23.

## 2023-10-18 ENCOUNTER — Other Ambulatory Visit (HOSPITAL_COMMUNITY): Payer: Self-pay

## 2023-10-19 ENCOUNTER — Other Ambulatory Visit: Payer: Self-pay

## 2023-10-24 NOTE — Progress Notes (Unsigned)
Patient Care Team: [name], NP as PCP - General (Internal Medicine) Malachy Mood, MD as Consulting Physician (Hematology and Oncology)  Clinic Day:  10/25/2023  Referring physician: Malachy Mood, MD  ASSESSMENT & PLAN:   Assessment & Plan: Cancer of sigmoid colon (HCC) pT4bN0M0, MMR normal, G2 -Patient presented with perforated sigmoid colon and a contained abscess required IR drainage tube placement. -He underwent left hemicolectomy and partial small bowel resection.  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  All surgical margins were negative. -Due to the high risk of recurrence given T4b lesion and bowel perforation, I recommend adjuvant chemotherapy FOLFOX or CAPOX for 6 months if he can tolerate. He opted CAPOX and started on 09/12/2023  Today is cycle 3 day 1.  Plan: Patient seen in infusion. Labs reviewed  -CBC showing WBC 4.3; Hgb 13.0; Hct 36.7; Plt 140; Anc 2.6 -CMP - K 3.6; glucose 134; BUN 9; Creatinine 1.02; eGFR > 60; Ca 9.2; LFTs normal; T.bili 1.4 Labs and patient condition are adequate for treatment today.  This is cycle 3 day 1. Labs/flush, follow-up, and treatment as scheduled.    The patient understands the plans discussed today and is in agreement with them.  He knows to contact our office if he develops concerns prior to his next appointment.  I provided 20 minutes of face-to-face time during this encounter and > 50% was spent counseling as documented under my assessment and plan.    Carlean Jews, NP  [facility] - [facility] [address] [place] [address] Dept: [phone] Dept Fax: [phone]   No orders of the defined types were placed in this encounter.     CHIEF COMPLAINT:  CC: f/u colon cancer  Current Treatment: Adjuvant CapeOx  INTERVAL HISTORY:  Duane [name]  is here today for repeat clinical assessment.  Was last seen by Dr. Mosetta Putt on 10/03/2023.  Today is cycle 3 day 1.  Has tolerated prior cycles with some cold sensitivity.  Reports drinking and eating room temperature liquids and foods for for several days after treatment.  If accidentally drinking something very cold, feels like something poking him in his throat and immediately feels inflamed.  Resolves very quickly.  Previously had good energy levels.  He denies fevers or chills. He denies pain. He denies chest pain, chest pressure, or shortness of breath. He denies headaches or visual disturbances. He denies abdominal pain, nausea, vomiting, or changes in bowel or bladder habits.  His appetite is good. His weight has been stable.  I have reviewed the past medical history, past surgical history, social history and family history with the patient and they are unchanged from previous note.  ALLERGIES:  is allergic to codeine.  MEDICATIONS:  Current Outpatient Medications  Medication Sig Dispense Refill   amLODipine (NORVASC) 10 MG tablet Take 1 tablet (10 mg total) by mouth daily. 90 tablet 1   capecitabine (XELODA) 500 MG tablet Take 3 tablets (1500 mg total) by mouth in morning and 4 tablets (2000 mg total) in evening. Take within 30 minutes after meals. Take for 14 days, then off for 7 days. Repeat every 21 days. 98 tablet 1   diclofenac Sodium (VOLTAREN) 1 % GEL Research Patient: Apply 0.5 grams (1 fingertip) to each hand and each foot twice daily for up to 12 weeks 400 g  0   lidocaine-prilocaine (EMLA) cream Apply to affected area once 30 g 3   metFORMIN (GLUCOPHAGE) 500 MG tablet TAKE 1 TABLET BY MOUTH TWICE DAILY WITH A MEAL 180 tablet 0   ondansetron (ZOFRAN) 8 MG tablet Take 1 tablet (8 mg total) by mouth every 8 (eight) hours as needed for nausea or vomiting. Start on the third day after chemotherapy. 30 tablet 1   pravastatin (PRAVACHOL) 20 MG tablet Take 1 tablet by mouth once daily 90 tablet  0   prochlorperazine (COMPAZINE) 10 MG tablet Take 1 tablet (10 mg total) by mouth every 6 (six) hours as needed for nausea or vomiting. (Patient not taking: Reported on 09/05/2023) 30 tablet 1   No current facility-administered medications for this visit.   Facility-Administered Medications Ordered in Other Visits  Medication Dose Route Frequency Provider Last Rate Last Admin   dextrose 5 % solution   Intravenous Continuous Malachy Mood, MD 10 mL/hr at 10/25/23 0835 New Bag at 10/25/23 0835   heparin lock flush 100 unit/mL  500 Units Intracatheter Once PRN Malachy Mood, MD       oxaliplatin (ELOXATIN) 300 mg in dextrose 5 % 500 mL chemo infusion  130 mg/m2 (Treatment Plan Recorded) Intravenous Once Malachy Mood, MD 280 mL/hr at 10/25/23 0918 300 mg at 10/25/23 0918   sodium chloride flush (NS) 0.9 % injection 10 mL  10 mL Intracatheter PRN Malachy Mood, MD        HISTORY OF PRESENT ILLNESS:   Oncology History  Cancer of sigmoid colon (HCC)  05/21/2023 Imaging   CT ABDOMEN PELVIS W CONTRAST   IMPRESSION: 1. Sigmoid colon bowel wall thickening may reflect a colonic mass or focal diverticulitis. Multiple rim enhancing fluid collections in the abdomen or pelvis likely reflect abscesses and suggest bowel perforation due to the colonic mass/focal diverticulitis. 2. Multiple loops of dilated small bowel with a possible transition to decompressed small bowel in the lower right abdomen. This may reflect ileus or small-bowel obstruction and is likely related to the multiple abdominal abscesses/intraperitoneal inflammatory process.   06/03/2023 Imaging   CT ABDOMEN PELVIS W CONTRAST    IMPRESSION: 1. Interval placement of midline anterior and left transgluteal approach pelvic drains. The abscesses have resolved. 2. Masslike thickening of the sigmoid colon remains highly suspicious for primary neoplasm. 3. Soft tissue nodular implant in the right lower quadrant measuring 1.5 cm is suspicious for  metastatic implant.     06/08/2023 Pathology Results   Diagnosis 1. Colon, polyp(s), transverse and ascending (3) - TUBULAR ADENOMA, FRAGMENTS. 2. Colon, polyp(s), sigmoid and descending (2) - TUBULOVILLOUS/TUBULAR ADENOMA, FRAGMENTS. 3. 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 NOTE: IT IS NOTED THAT THIS IS SUBMITTED AS A MASS. WHILE THERE IS FOCAL SPLAYING OF SMOOTH MUSCLE DEFINITIVE EVIDENCE OF INVASION INCLUDING DESMOPLASIA OR THE PRESENCE OF SUBMUCOSAL TISSUE IS NOT PRESENT IN THESE SUPERFICIAL BIOPSIES. CLINICAL/ENDOSCOPIC CORRELATION NECESSARY. 4. Rectum, polyp(s), (2) - TUBULOVILLOUS ADENOMA, FRAGMENTS.   06/30/2023 PET scan   NM PET IMAGE initial (PI) SKULL BASE TO THIGH  IMPRESSION: 1. Hypermetabolic mass in the proximal sigmoid colon consistent with primary colorectal carcinoma. 2. No evidence of metastatic adenopathy or peritoneal metastasis. No liver metastasis or distant metastasis   07/10/2023 Initial Diagnosis   Cancer of sigmoid colon [facility])    Imaging     08/15/2023 Cancer Staging   Staging form: Colon and Rectum, AJCC 8th Edition - Pathologic stage from 08/15/2023: Stage IIC (pT4b, pN0,  cM0) - Signed by Malachy Mood, MD on 08/30/2023 Total positive nodes: 0 Histologic grading system: 4 grade system Histologic grade (G): G2 Residual tumor (R): R0 - None   09/12/2023 -  Chemotherapy   Patient is on Treatment Plan : COLORECTAL Xelox (Capeox)(130/850) q21d         REVIEW OF SYSTEMS:   Constitutional: Denies fevers, chills or abnormal weight loss Eyes: Denies blurriness of vision Ears, nose, mouth, throat, and face: Denies mucositis or sore throat Respiratory: Denies dyspnea or wheezing.  Does have mild congested cough. Cardiovascular: Denies palpitation, chest discomfort or lower extremity swelling Gastrointestinal:  Denies nausea, heartburn or change in bowel habits Skin: Denies abnormal skin  rashes Lymphatics: Denies new lymphadenopathy or easy bruising Neurological:Denies numbness, tingling or new weaknesses Behavioral/Psych: Mood is stable, no new changes  All other systems were reviewed with the patient and are negative.   Today's Vitals   10/25/23 1056  BP: (!) 151/81  Pulse: 88  Resp: 18  Temp: 98.9 F (37.2 C)  SpO2: 100%  Weight: 207 lb (93.9 kg)   Body mass index is 26.58 kg/m.   Wt Readings from Last 3 Encounters:  10/25/23 207 lb (93.9 kg)  10/25/23 207 lb (93.9 kg)  10/03/23 210 lb 3.2 oz (95.3 kg)    Body mass index is 26.58 kg/m.  Performance status (ECOG): 1 - Symptomatic but completely ambulatory  PHYSICAL EXAM:   GENERAL:alert, no distress and comfortable SKIN: skin color, texture, turgor are normal, no rashes or significant lesions EYES: normal, Conjunctiva are pink and non-injected, sclera clear OROPHARYNX:no exudate, no erythema and lips, buccal mucosa, and tongue normal  NECK: supple, thyroid normal size, non-tender, without nodularity LYMPH:  no palpable lymphadenopathy in the cervical, axillary or inguinal LUNGS: clear to auscultation and percussion with normal breathing effort.  Some congestion present in bilateral lower lung lobes.  Clears with cough. HEART: regular rate & rhythm and no murmurs and no lower extremity edema ABDOMEN:abdomen soft, non-tender and normal bowel sounds Musculoskeletal:no cyanosis of digits and no clubbing  NEURO: alert & oriented x 3 with fluent speech, no focal motor/sensory deficits  LABORATORY DATA:  I have reviewed the data as listed    Component Value Date/Time   NA 139 10/25/2023 0748   NA 141 06/13/2023 1440   K 3.6 10/25/2023 0748   CL 107 10/25/2023 0748   CO2 27 10/25/2023 0748   GLUCOSE 134 (H) 10/25/2023 0748   BUN 9 10/25/2023 0748   BUN 6 (L) 06/13/2023 1440   CREATININE 1.02 10/25/2023 0748   CALCIUM 9.2 10/25/2023 0748   PROT 7.1 10/25/2023 0748   PROT 6.8 06/13/2023 1440    ALBUMIN 3.9 10/25/2023 0748   ALBUMIN 3.8 (L) 06/13/2023 1440   AST 28 10/25/2023 0748   ALT 31 10/25/2023 0748   ALKPHOS 58 10/25/2023 0748   BILITOT 1.4 (H) 10/25/2023 0748   GFRNONAA >60 10/25/2023 0748     Lab Results  Component Value Date   WBC 4.3 10/25/2023   NEUTROABS 2.6 10/25/2023   HGB 13.0 10/25/2023   HCT 36.7 (L) 10/25/2023   MCV 84.2 10/25/2023   PLT 140 (L) 10/25/2023

## 2023-10-24 NOTE — Assessment & Plan Note (Signed)
pT4bN0M0, MMR normal, G2 -Patient presented with perforated sigmoid colon and a contained abscess required IR drainage tube placement. -He underwent left hemicolectomy and partial small bowel resection.  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  All surgical margins were negative. -Due to the high risk of recurrence given T4b lesion and bowel perforation, I recommend adjuvant chemotherapy FOLFOX or CAPOX for 6 months if he can tolerate. He opted CAPOX and started on 09/12/2023  Today is cycle 3 day 1.

## 2023-10-25 ENCOUNTER — Inpatient Hospital Stay: Payer: Commercial Managed Care - HMO

## 2023-10-25 ENCOUNTER — Encounter: Payer: Self-pay | Admitting: Nurse Practitioner

## 2023-10-25 ENCOUNTER — Inpatient Hospital Stay (HOSPITAL_BASED_OUTPATIENT_CLINIC_OR_DEPARTMENT_OTHER): Payer: Commercial Managed Care - HMO | Admitting: Nurse Practitioner

## 2023-10-25 VITALS — BP 151/81 | HR 88 | Temp 98.9°F | Resp 18 | Wt 207.0 lb

## 2023-10-25 DIAGNOSIS — C187 Malignant neoplasm of sigmoid colon: Secondary | ICD-10-CM

## 2023-10-25 DIAGNOSIS — Z95828 Presence of other vascular implants and grafts: Secondary | ICD-10-CM

## 2023-10-25 DIAGNOSIS — Z5111 Encounter for antineoplastic chemotherapy: Secondary | ICD-10-CM | POA: Diagnosis not present

## 2023-10-25 LAB — CBC WITH DIFFERENTIAL (CANCER CENTER ONLY)
Abs Immature Granulocytes: 0.02 10*3/uL (ref 0.00–0.07)
Basophils Absolute: 0 10*3/uL (ref 0.0–0.1)
Basophils Relative: 0 %
Eosinophils Absolute: 0.1 10*3/uL (ref 0.0–0.5)
Eosinophils Relative: 1 %
HCT: 36.7 % — ABNORMAL LOW (ref 39.0–52.0)
Hemoglobin: 13 g/dL (ref 13.0–17.0)
Immature Granulocytes: 1 %
Lymphocytes Relative: 30 %
Lymphs Abs: 1.3 10*3/uL (ref 0.7–4.0)
MCH: 29.8 pg (ref 26.0–34.0)
MCHC: 35.4 g/dL (ref 30.0–36.0)
MCV: 84.2 fL (ref 80.0–100.0)
Monocytes Absolute: 0.4 10*3/uL (ref 0.1–1.0)
Monocytes Relative: 9 %
Neutro Abs: 2.6 10*3/uL (ref 1.7–7.7)
Neutrophils Relative %: 59 %
Platelet Count: 140 10*3/uL — ABNORMAL LOW (ref 150–400)
RBC: 4.36 MIL/uL (ref 4.22–5.81)
RDW: 19.3 % — ABNORMAL HIGH (ref 11.5–15.5)
WBC Count: 4.3 10*3/uL (ref 4.0–10.5)
nRBC: 0 % (ref 0.0–0.2)

## 2023-10-25 LAB — CMP (CANCER CENTER ONLY)
ALT: 31 U/L (ref 0–44)
AST: 28 U/L (ref 15–41)
Albumin: 3.9 g/dL (ref 3.5–5.0)
Alkaline Phosphatase: 58 U/L (ref 38–126)
Anion gap: 5 (ref 5–15)
BUN: 9 mg/dL (ref 8–23)
CO2: 27 mmol/L (ref 22–32)
Calcium: 9.2 mg/dL (ref 8.9–10.3)
Chloride: 107 mmol/L (ref 98–111)
Creatinine: 1.02 mg/dL (ref 0.61–1.24)
GFR, Estimated: >60 mL/min (ref >60)
Glucose, Bld: 134 mg/dL — ABNORMAL HIGH (ref 70–99)
Potassium: 3.6 mmol/L (ref 3.5–5.1)
Sodium: 139 mmol/L (ref 135–145)
Total Bilirubin: 1.4 mg/dL — ABNORMAL HIGH (ref <1.2)
Total Protein: 7.1 g/dL (ref 6.5–8.1)

## 2023-10-25 MED ORDER — DEXAMETHASONE SODIUM PHOSPHATE 10 MG/ML IJ SOLN
10.0000 mg | Freq: Once | INTRAMUSCULAR | Status: AC
  Administered 2023-10-25: 10 mg via INTRAVENOUS
  Filled 2023-10-25: qty 1

## 2023-10-25 MED ORDER — SODIUM CHLORIDE 0.9% FLUSH
10.0000 mL | INTRAVENOUS | Status: DC | PRN
  Administered 2023-10-25: 10 mL

## 2023-10-25 MED ORDER — PALONOSETRON HCL INJECTION 0.25 MG/5ML
0.2500 mg | Freq: Once | INTRAVENOUS | Status: AC
Start: 2023-10-25 — End: 2023-10-25
  Administered 2023-10-25: 0.25 mg via INTRAVENOUS
  Filled 2023-10-25: qty 5

## 2023-10-25 MED ORDER — HEPARIN SOD (PORK) LOCK FLUSH 100 UNIT/ML IV SOLN
500.0000 [IU] | Freq: Once | INTRAVENOUS | Status: AC | PRN
Start: 2023-10-25 — End: 2023-10-25
  Administered 2023-10-25: 500 [IU]

## 2023-10-25 MED ORDER — DEXTROSE 5 % IV SOLN: INTRAVENOUS | Status: DC

## 2023-10-25 MED ORDER — SODIUM CHLORIDE 0.9% FLUSH
10.0000 mL | Freq: Once | INTRAVENOUS | Status: AC
  Administered 2023-10-25: 10 mL

## 2023-10-25 MED ORDER — DEXTROSE 5 % IV SOLN
130.0000 mg/m2 | Freq: Once | INTRAVENOUS | Status: AC
  Administered 2023-10-25: 300 mg via INTRAVENOUS
  Filled 2023-10-25: qty 60

## 2023-10-25 NOTE — Patient Instructions (Signed)
[facility]  Discharge Instructions: Thank you for choosing [facility] to provide your oncology and hematology care.   If you have a lab appointment with the Cancer Center, please go directly to the Cancer Center and check in at the registration area.   Wear comfortable clothing and clothing appropriate for easy access to any Portacath or PICC line.   We strive to give you quality time with your provider. You may need to reschedule your appointment if you arrive late (15 or more minutes).  Arriving late affects you and other patients whose appointments are after yours.  Also, if you miss three or more appointments without notifying the office, you may be dismissed from the clinic at the provider's discretion.      For prescription refill requests, have your pharmacy contact our office and allow 72 hours for refills to be completed.    Today you received the following chemotherapy and/or immunotherapy agents: oxaliplatin      To help prevent nausea and vomiting after your treatment, we encourage you to take your nausea medication as directed.  BELOW ARE SYMPTOMS THAT SHOULD BE REPORTED IMMEDIATELY: *FEVER GREATER THAN 100.4 F (38 C) OR HIGHER *CHILLS OR SWEATING *NAUSEA AND VOMITING THAT IS NOT CONTROLLED WITH YOUR NAUSEA MEDICATION *UNUSUAL SHORTNESS OF BREATH *UNUSUAL BRUISING OR BLEEDING *URINARY PROBLEMS (pain or burning when urinating, or frequent urination) *BOWEL PROBLEMS (unusual diarrhea, constipation, pain near the anus) TENDERNESS IN MOUTH AND THROAT WITH OR WITHOUT PRESENCE OF ULCERS (sore throat, sores in mouth, or a toothache) UNUSUAL RASH, SWELLING OR PAIN  UNUSUAL VAGINAL DISCHARGE OR ITCHING   Items with * indicate a potential emergency and should be followed up as soon as possible or go to the Emergency Department if any problems should occur.  Please show the CHEMOTHERAPY ALERT CARD or  IMMUNOTHERAPY ALERT CARD at check-in to the Emergency Department and triage nurse.  Should you have questions after your visit or need to cancel or reschedule your appointment, please contact [facility]  Dept: [phone]  and follow the prompts.  Office hours are 8:00 a.m. to 4:30 p.m. Monday - Friday. Please note that voicemails left after 4:00 p.m. may not be returned until the following business day.  We are closed weekends and major holidays. You have access to a nurse at all times for urgent questions. Please call the main number to the clinic Dept: [phone] and follow the prompts.   For any non-urgent questions, you may also contact your provider using MyChart. We now offer e-Visits for anyone 64 and older to request care online for non-urgent symptoms. For details visit [email or web address].   Also download the MyChart app! Go to the app store, search "MyChart", open the app, select [place], and log in with your MyChart username and password.

## 2023-11-08 ENCOUNTER — Other Ambulatory Visit: Payer: Self-pay

## 2023-11-08 ENCOUNTER — Other Ambulatory Visit (HOSPITAL_COMMUNITY): Payer: Self-pay

## 2023-11-08 ENCOUNTER — Other Ambulatory Visit: Payer: Self-pay | Admitting: Hematology

## 2023-11-08 DIAGNOSIS — C187 Malignant neoplasm of sigmoid colon: Secondary | ICD-10-CM

## 2023-11-08 MED ORDER — CAPECITABINE 500 MG PO TABS
ORAL_TABLET | ORAL | 1 refills | Status: DC
  Filled 2023-11-08: qty 98, 21d supply, fill #0
  Filled 2023-11-25: qty 98, 21d supply, fill #1

## 2023-11-08 NOTE — Progress Notes (Signed)
 Specialty Pharmacy Refill Coordination Note  Duane [name] is a 63 y.o. male contacted today regarding refills of specialty medication(s) Capecitabine  (XELODA )   Patient requested Delivery   Delivery date: 11/11/23   Verified address: [address] [place], [place]   Medication will be filled on 11/10/23.

## 2023-11-10 ENCOUNTER — Other Ambulatory Visit: Payer: Self-pay

## 2023-11-13 NOTE — Assessment & Plan Note (Signed)
 pT4bN0M0, MMR normal, G2 -Patient presented with perforated sigmoid colon and a contained abscess required IR drainage tube placement. -He underwent left hemicolectomy and partial small bowel resection.  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  All surgical margins were negative. -Due to the high risk of recurrence given T4b lesion and bowel perforation, I recommend adjuvant chemotherapy FOLFOX or CAPOX for 6 months if he can tolerate. He opted CAPOX and started on 09/12/2023

## 2023-11-14 ENCOUNTER — Inpatient Hospital Stay (HOSPITAL_BASED_OUTPATIENT_CLINIC_OR_DEPARTMENT_OTHER): Payer: Commercial Managed Care - HMO | Admitting: Hematology

## 2023-11-14 ENCOUNTER — Inpatient Hospital Stay: Payer: Commercial Managed Care - HMO

## 2023-11-14 ENCOUNTER — Encounter: Payer: Self-pay | Admitting: Hematology

## 2023-11-14 ENCOUNTER — Inpatient Hospital Stay: Payer: Commercial Managed Care - HMO | Attending: Hematology

## 2023-11-14 VITALS — BP 143/81 | HR 74 | Temp 98.7°F | Resp 18 | Wt 213.0 lb

## 2023-11-14 DIAGNOSIS — D500 Iron deficiency anemia secondary to blood loss (chronic): Secondary | ICD-10-CM

## 2023-11-14 DIAGNOSIS — Z5111 Encounter for antineoplastic chemotherapy: Secondary | ICD-10-CM | POA: Insufficient documentation

## 2023-11-14 DIAGNOSIS — C187 Malignant neoplasm of sigmoid colon: Secondary | ICD-10-CM | POA: Diagnosis not present

## 2023-11-14 DIAGNOSIS — Z79899 Other long term (current) drug therapy: Secondary | ICD-10-CM | POA: Insufficient documentation

## 2023-11-14 LAB — CBC WITH DIFFERENTIAL (CANCER CENTER ONLY)
Abs Immature Granulocytes: 0.01 10*3/uL (ref 0.00–0.07)
Basophils Absolute: 0 10*3/uL (ref 0.0–0.1)
Basophils Relative: 1 %
Eosinophils Absolute: 0 10*3/uL (ref 0.0–0.5)
Eosinophils Relative: 1 %
HCT: 33.5 % — ABNORMAL LOW (ref 39.0–52.0)
Hemoglobin: 11.9 g/dL — ABNORMAL LOW (ref 13.0–17.0)
Immature Granulocytes: 0 %
Lymphocytes Relative: 34 %
Lymphs Abs: 1.3 10*3/uL (ref 0.7–4.0)
MCH: 31 pg (ref 26.0–34.0)
MCHC: 35.5 g/dL (ref 30.0–36.0)
MCV: 87.2 fL (ref 80.0–100.0)
Monocytes Absolute: 0.4 10*3/uL (ref 0.1–1.0)
Monocytes Relative: 10 %
Neutro Abs: 2.1 10*3/uL (ref 1.7–7.7)
Neutrophils Relative %: 54 %
Platelet Count: 121 10*3/uL — ABNORMAL LOW (ref 150–400)
RBC: 3.84 MIL/uL — ABNORMAL LOW (ref 4.22–5.81)
RDW: 21.4 % — ABNORMAL HIGH (ref 11.5–15.5)
WBC Count: 3.8 10*3/uL — ABNORMAL LOW (ref 4.0–10.5)
nRBC: 0 % (ref 0.0–0.2)

## 2023-11-14 LAB — CMP (CANCER CENTER ONLY)
ALT: 31 U/L (ref 0–44)
AST: 30 U/L (ref 15–41)
Albumin: 3.7 g/dL (ref 3.5–5.0)
Alkaline Phosphatase: 59 U/L (ref 38–126)
Anion gap: 6 (ref 5–15)
BUN: 10 mg/dL (ref 8–23)
CO2: 27 mmol/L (ref 22–32)
Calcium: 9.6 mg/dL (ref 8.9–10.3)
Chloride: 105 mmol/L (ref 98–111)
Creatinine: 0.93 mg/dL (ref 0.61–1.24)
GFR, Estimated: >60 mL/min (ref >60)
Glucose, Bld: 224 mg/dL — ABNORMAL HIGH (ref 70–99)
Potassium: 3.7 mmol/L (ref 3.5–5.1)
Sodium: 138 mmol/L (ref 135–145)
Total Bilirubin: 1.4 mg/dL — ABNORMAL HIGH (ref 0.0–1.2)
Total Protein: 6.8 g/dL (ref 6.5–8.1)

## 2023-11-14 LAB — FERRITIN: Ferritin: 60 ng/mL (ref 24–336)

## 2023-11-14 MED ORDER — HEPARIN SOD (PORK) LOCK FLUSH 100 UNIT/ML IV SOLN
500.0000 [IU] | Freq: Once | INTRAVENOUS | Status: AC | PRN
  Administered 2023-11-14: 500 [IU]

## 2023-11-14 MED ORDER — DEXAMETHASONE SODIUM PHOSPHATE 10 MG/ML IJ SOLN
10.0000 mg | Freq: Once | INTRAMUSCULAR | Status: AC
Start: 2023-11-14 — End: 2023-11-14
  Administered 2023-11-14: 10 mg via INTRAVENOUS
  Filled 2023-11-14: qty 1

## 2023-11-14 MED ORDER — DEXTROSE 5 % IV SOLN: INTRAVENOUS | Status: DC

## 2023-11-14 MED ORDER — SODIUM CHLORIDE 0.9% FLUSH
10.0000 mL | INTRAVENOUS | Status: DC | PRN
  Administered 2023-11-14: 10 mL

## 2023-11-14 MED ORDER — OXALIPLATIN CHEMO INJECTION 100 MG/20ML
130.0000 mg/m2 | Freq: Once | INTRAVENOUS | Status: AC
  Administered 2023-11-14: 300 mg via INTRAVENOUS
  Filled 2023-11-14: qty 60

## 2023-11-14 MED ORDER — PALONOSETRON HCL INJECTION 0.25 MG/5ML
0.2500 mg | Freq: Once | INTRAVENOUS | Status: AC
  Administered 2023-11-14: 0.25 mg via INTRAVENOUS
  Filled 2023-11-14: qty 5

## 2023-11-14 NOTE — Patient Instructions (Signed)
 [facility]  Discharge Instructions: Thank you for choosing [facility] to provide your oncology and hematology care.   If you have a lab appointment with the Cancer Center, please go directly to the Cancer Center and check in at the registration area.   Wear comfortable clothing and clothing appropriate for easy access to any Portacath or PICC line.   We strive to give you quality time with your provider. You may need to reschedule your appointment if you arrive late (15 or more minutes).  Arriving late affects you and other patients whose appointments are after yours.  Also, if you miss three or more appointments without notifying the office, you may be dismissed from the clinic at the provider's discretion.      For prescription refill requests, have your pharmacy contact our office and allow 72 hours for refills to be completed.    Today you received the following chemotherapy and/or immunotherapy agents: Oxaliplatin      To help prevent nausea and vomiting after your treatment, we encourage you to take your nausea medication as directed.  BELOW ARE SYMPTOMS THAT SHOULD BE REPORTED IMMEDIATELY: *FEVER GREATER THAN 100.4 F (38 C) OR HIGHER *CHILLS OR SWEATING *NAUSEA AND VOMITING THAT IS NOT CONTROLLED WITH YOUR NAUSEA MEDICATION *UNUSUAL SHORTNESS OF BREATH *UNUSUAL BRUISING OR BLEEDING *URINARY PROBLEMS (pain or burning when urinating, or frequent urination) *BOWEL PROBLEMS (unusual diarrhea, constipation, pain near the anus) TENDERNESS IN MOUTH AND THROAT WITH OR WITHOUT PRESENCE OF ULCERS (sore throat, sores in mouth, or a toothache) UNUSUAL RASH, SWELLING OR PAIN  UNUSUAL VAGINAL DISCHARGE OR ITCHING   Items with * indicate a potential emergency and should be followed up as soon as possible or go to the Emergency Department if any problems should occur.  Please show the CHEMOTHERAPY ALERT CARD or  IMMUNOTHERAPY ALERT CARD at check-in to the Emergency Department and triage nurse.  Should you have questions after your visit or need to cancel or reschedule your appointment, please contact [facility]  Dept: [phone]  and follow the prompts.  Office hours are 8:00 a.m. to 4:30 p.m. Monday - Friday. Please note that voicemails left after 4:00 p.m. may not be returned until the following business day.  We are closed weekends and major holidays. You have access to a nurse at all times for urgent questions. Please call the main number to the clinic Dept: [phone] and follow the prompts.   For any non-urgent questions, you may also contact your provider using MyChart. We now offer e-Visits for anyone 36 and older to request care online for non-urgent symptoms. For details visit [email or web address].   Also download the MyChart app! Go to the app store, search "MyChart", open the app, select [place], and log in with your MyChart username and password.

## 2023-11-14 NOTE — Progress Notes (Signed)
 [facility]   Telephone:([phone] Fax:(336) [phone]   Clinic Follow up Note   Patient Care Team: Celestia Rosaline SQUIBB, NP as PCP - General (Internal Medicine) Lanny Callander, MD as Consulting Physician (Hematology and Oncology)  Date of Service:  11/14/2023  CHIEF COMPLAINT: f/u of colon cancer  CURRENT THERAPY:  Adjuvant chemotherapy CapeOx every 3 weeks  Oncology History   Cancer of sigmoid colon (HCC) pT4bN0M0, MMR normal, G2 -Patient presented with perforated sigmoid colon and a contained abscess required IR drainage tube placement. -He underwent left hemicolectomy and partial small bowel resection.  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  All surgical margins were negative. -Due to the high risk of recurrence given T4b lesion and bowel perforation, I recommend adjuvant chemotherapy FOLFOX or CAPOX for 6 months if he can tolerate. He opted CAPOX and started on 09/12/2023     Assessment and Plan    Colon Cancer 63 year old undergoing chemotherapy, currently on cycle four of eight. Reports cold sensitivity post-chemotherapy, no current neuropathy. Appetite remains decreased. Mild cytopenias noted, expected from chemotherapy. Last scan in September, next scan planned for March. Discussed potential discontinuation of final cycles if intravenous chemotherapy becomes problematic. Advised on travel precautions including thrombosis risk and carrying antiemetics and chemotherapy pills. - Continue next two cycles of chemotherapy - Monitor and adjust treatment for side effects - Ensure adequate supply of chemotherapy pills and refills - Order scan for March - Advise travel with antiemetics, chemotherapy pills, and mask - Recommend breaks during long car trips to reduce thrombosis risk - Schedule follow-up in three weeks  General Health Maintenance Discussed health maintenance  related to chemotherapy and travel. - Advise wearing a mask when going out - Ensure awareness of importance of refilling chemotherapy pills during one-week break.      Plan -Lab reviewed, mild pancytopenia, secondary to chemotherapy, adequate for treatment, will proceed cycle 4 oxaliplatin  today, he will continue taking Xeloda  at home -Follow-up in 3 weeks before cycle 5 -Will order restaging CT scan on next visit   SUMMARY OF ONCOLOGIC HISTORY: Oncology History  Cancer of sigmoid colon (HCC)  05/21/2023 Imaging   CT ABDOMEN PELVIS W CONTRAST   IMPRESSION: 1. Sigmoid colon bowel wall thickening may reflect a colonic mass or focal diverticulitis. Multiple rim enhancing fluid collections in the abdomen or pelvis likely reflect abscesses and suggest bowel perforation due to the colonic mass/focal diverticulitis. 2. Multiple loops of dilated small bowel with a possible transition to decompressed small bowel in the lower right abdomen. This may reflect ileus or small-bowel obstruction and is likely related to the multiple abdominal abscesses/intraperitoneal inflammatory process.   06/03/2023 Imaging   CT ABDOMEN PELVIS W CONTRAST    IMPRESSION: 1. Interval placement of midline anterior and left transgluteal approach pelvic drains. The abscesses have resolved. 2. Masslike thickening of the sigmoid colon remains highly suspicious for primary neoplasm. 3. Soft tissue nodular implant in the right lower quadrant measuring 1.5 cm is suspicious for metastatic implant.     06/08/2023 Pathology Results   Diagnosis 1. Colon, polyp(s), transverse and ascending (3) - TUBULAR ADENOMA, FRAGMENTS. 2. Colon, polyp(s), sigmoid and descending (2) - TUBULOVILLOUS/TUBULAR ADENOMA, FRAGMENTS. 3. 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 NOTE: IT IS NOTED THAT THIS IS SUBMITTED AS A MASS. WHILE THERE IS FOCAL SPLAYING OF SMOOTH  MUSCLE DEFINITIVE EVIDENCE OF INVASION INCLUDING DESMOPLASIA  OR THE PRESENCE OF SUBMUCOSAL TISSUE IS NOT PRESENT IN THESE SUPERFICIAL BIOPSIES. CLINICAL/ENDOSCOPIC CORRELATION NECESSARY. 4. Rectum, polyp(s), (2) - TUBULOVILLOUS ADENOMA, FRAGMENTS.   06/30/2023 PET scan   NM PET IMAGE initial (PI) SKULL BASE TO THIGH  IMPRESSION: 1. Hypermetabolic mass in the proximal sigmoid colon consistent with primary colorectal carcinoma. 2. No evidence of metastatic adenopathy or peritoneal metastasis. No liver metastasis or distant metastasis   07/10/2023 Initial Diagnosis   Cancer of sigmoid colon [facility])    Imaging     08/15/2023 Cancer Staging   Staging form: Colon and Rectum, AJCC 8th Edition - Pathologic stage from 08/15/2023: Stage IIC (pT4b, pN0, cM0) - Signed by Lanny Callander, MD on 08/30/2023 Total positive nodes: 0 Histologic grading system: 4 grade system Histologic grade (G): G2 Residual tumor (R): R0 - None   09/12/2023 -  Chemotherapy   Patient is on Treatment Plan : COLORECTAL Xelox (Capeox)(130/850) q21d        Discussed the use of AI scribe software for clinical note transcription with the patient, who gave verbal consent to proceed.  History of Present Illness   The patient, a 63 year old with colon cancer, presents for a follow-up visit after recent chemotherapy. He reports cold sensitivity lasting about a week following chemotherapy, but denies any current cold sensitivity. He also denies any current numbness or tingling. He has been tolerating the chemotherapy well, with no reported issues. He has been taking chemotherapy pills without any problems, but notes a change in his appetite since starting treatment. He has expressed a desire to travel out of state during his treatment, and has been advised on precautions to take during travel. He has been managing his chemotherapy pill refills well, and has been advised to continue doing so.         All other systems were reviewed  with the patient and are negative.  MEDICAL HISTORY:  Past Medical History:  Diagnosis Date   Colon cancer (HCC)    Diabetes mellitus without complication (HCC) type 2    GERD (gastroesophageal reflux disease)    Hypertension     SURGICAL HISTORY: Past Surgical History:  Procedure Laterality Date   COLONOSCOPY     FLEXIBLE SIGMOIDOSCOPY N/A 08/15/2023   Procedure: FLEXIBLE SIGMOIDOSCOPY;  Surgeon: Teresa Lonni HERO, MD;  Location: [facility];  Service: General;  Laterality: N/A;   IR RADIOLOGIST EVAL & MGMT  06/03/2023   LAPAROSCOPY N/A 08/15/2023   Procedure: LAPAROSCOPY DIAGNOSTIC;  Surgeon: Teresa Lonni HERO, MD;  Location: [facility];  Service: General;  Laterality: N/A;   PORTACATH PLACEMENT Right 09/09/2023   Procedure: INSERTION PORT-A-CATH ULTRASOUND FLUOROSCOPY;  Surgeon: Teresa Lonni HERO, MD;  Location: [facility];  Service: General;  Laterality: Right;   XI ROBOTIC ASSISTED LOWER ANTERIOR RESECTION N/A 08/15/2023   Procedure: XI ROBOTIC ASSISTED LOWER ANTERIOR RESECTION, SMALL BOWEL RESECTION, LYSIS OF ADHESIONS, AND INTRAOPERATIVE ASSESSMENT OF PERFUSION USING ICG DYE;  Surgeon: Teresa Lonni HERO, MD;  Location: [facility];  Service: General;  Laterality: N/A;    I have reviewed the social history and family history with the patient and they are unchanged from previous note.  ALLERGIES:  is allergic to codeine.  MEDICATIONS:  Current Outpatient Medications  Medication Sig Dispense Refill   amLODipine  (NORVASC ) 10 MG tablet Take 1 tablet (10 mg total) by mouth daily. 90 tablet 1   capecitabine  (XELODA ) 500 MG tablet Take 3 tablets (1500 mg total) by mouth in morning and 4 tablets (2000 mg total)  in evening. Take within 30 minutes after meals. Take for 14 days, then off for 7 days. Repeat every 21 days. 98 tablet 1   diclofenac  Sodium (VOLTAREN ) 1 % GEL Research Patient: Apply 0.5 grams (1 fingertip) to each hand and each foot twice daily for up to 12  weeks 400 g 0   lidocaine -prilocaine  (EMLA ) cream Apply to affected area once 30 g 3   metFORMIN  (GLUCOPHAGE ) 500 MG tablet TAKE 1 TABLET BY MOUTH TWICE DAILY WITH A MEAL 180 tablet 0   ondansetron  (ZOFRAN ) 8 MG tablet Take 1 tablet (8 mg total) by mouth every 8 (eight) hours as needed for nausea or vomiting. Start on the third day after chemotherapy. 30 tablet 1   pravastatin  (PRAVACHOL ) 20 MG tablet Take 1 tablet by mouth once daily 90 tablet 0   prochlorperazine  (COMPAZINE ) 10 MG tablet Take 1 tablet (10 mg total) by mouth every 6 (six) hours as needed for nausea or vomiting. (Patient not taking: Reported on 09/05/2023) 30 tablet 1   No current facility-administered medications for this visit.    PHYSICAL EXAMINATION: ECOG PERFORMANCE STATUS: 1 - Symptomatic but completely ambulatory  Vitals:   11/14/23 1016  BP: (!) 143/81  Pulse: 74  Resp: 18  Temp: 98.7 F (37.1 C)  SpO2: 100%   Wt Readings from Last 3 Encounters:  11/14/23 213 lb (96.6 kg)  10/25/23 207 lb (93.9 kg)  10/25/23 207 lb (93.9 kg)     GENERAL:alert, no distress and comfortable SKIN: skin color, texture, turgor are normal, no rashes or significant lesions EYES: normal, Conjunctiva are pink and non-injected, sclera clear NECK: supple, thyroid normal size, non-tender, without nodularity LYMPH:  no palpable lymphadenopathy in the cervical, axillary  LUNGS: clear to auscultation and percussion with normal breathing effort HEART: regular rate & rhythm and no murmurs and no lower extremity edema ABDOMEN:abdomen soft, non-tender and normal bowel sounds Musculoskeletal:no cyanosis of digits and no clubbing  NEURO: alert & oriented x 3 with fluent speech, no focal motor/sensory deficits   LABORATORY DATA:  I have reviewed the data as listed    Latest Ref Rng & Units 11/14/2023    9:55 AM 10/25/2023    7:48 AM 10/03/2023    9:39 AM  CBC  WBC 4.0 - 10.5 K/uL 3.8  4.3  4.6   Hemoglobin 13.0 - 17.0 g/dL 88.0  86.9   86.9   Hematocrit 39.0 - 52.0 % 33.5  36.7  36.6   Platelets 150 - 400 K/uL 121  140  210         Latest Ref Rng & Units 11/14/2023    9:55 AM 10/25/2023    7:48 AM 10/03/2023    9:39 AM  CMP  Glucose 70 - 99 mg/dL 775  865  89   BUN 8 - 23 mg/dL 10  9  8    Creatinine 0.61 - 1.24 mg/dL 9.06  8.97  9.09   Sodium 135 - 145 mmol/L 138  139  140   Potassium 3.5 - 5.1 mmol/L 3.7  3.6  3.7   Chloride 98 - 111 mmol/L 105  107  107   CO2 22 - 32 mmol/L 27  27  27    Calcium 8.9 - 10.3 mg/dL 9.6  9.2  9.1   Total Protein 6.5 - 8.1 g/dL 6.8  7.1  6.8   Total Bilirubin 0.0 - 1.2 mg/dL 1.4  1.4  1.1   Alkaline Phos 38 - 126 U/L 59  58  57   AST 15 - 41 U/L 30  28  15    ALT 0 - 44 U/L 31  31  13        RADIOGRAPHIC STUDIES: I have personally reviewed the radiological images as listed and agreed with the findings in the report. No results found.    Orders Placed This Encounter  Procedures   CBC with Differential (Cancer Center Only)    Standing Status:   Future    Expected Date:   12/05/2023    Expiration Date:   12/04/2024   CMP (Cancer Center only)    Standing Status:   Future    Expected Date:   12/05/2023    Expiration Date:   12/04/2024   CBC with Differential (Cancer Center Only)    Standing Status:   Future    Expected Date:   12/26/2023    Expiration Date:   12/25/2024   CMP (Cancer Center only)    Standing Status:   Future    Expected Date:   12/26/2023    Expiration Date:   12/25/2024   All questions were answered. The patient knows to call the clinic with any problems, questions or concerns. No barriers to learning was detected. The total time spent in the appointment was 25 minutes.     Onita Mattock, MD 11/14/2023

## 2023-11-15 ENCOUNTER — Other Ambulatory Visit: Payer: Self-pay

## 2023-11-16 ENCOUNTER — Other Ambulatory Visit: Payer: Self-pay

## 2023-11-17 ENCOUNTER — Other Ambulatory Visit: Payer: Self-pay

## 2023-11-22 ENCOUNTER — Emergency Department (HOSPITAL_COMMUNITY)
Admission: EM | Admit: 2023-11-22 | Discharge: 2023-11-22 | Disposition: A | Discharge status: Home / Self Care | Payer: Commercial Managed Care - HMO | Attending: Emergency Medicine | Admitting: Emergency Medicine

## 2023-11-22 ENCOUNTER — Encounter (HOSPITAL_COMMUNITY): Payer: Self-pay

## 2023-11-22 ENCOUNTER — Other Ambulatory Visit: Payer: Self-pay

## 2023-11-22 DIAGNOSIS — Z85038 Personal history of other malignant neoplasm of large intestine: Secondary | ICD-10-CM | POA: Insufficient documentation

## 2023-11-22 DIAGNOSIS — Z20822 Contact with and (suspected) exposure to covid-19: Secondary | ICD-10-CM | POA: Insufficient documentation

## 2023-11-22 DIAGNOSIS — Z20828 Contact with and (suspected) exposure to other viral communicable diseases: Secondary | ICD-10-CM

## 2023-11-22 LAB — RESP PANEL BY RT-PCR (RSV, FLU A&B, COVID)  RVPGX2
Influenza A by PCR: NEGATIVE
Influenza B by PCR: NEGATIVE
Resp Syncytial Virus by PCR: NEGATIVE
SARS Coronavirus 2 by RT PCR: NEGATIVE

## 2023-11-22 NOTE — Discharge Instructions (Signed)
Your COVID swab today is negative, please follow-up with your PCP as needed.  Just because your COVID swab was negative does not mean that you cannot get COVID.  Symptoms can take up to 10 days to develop.  Watch carefully, and follow-up with your PCP, if they decide that you need medication.

## 2023-11-22 NOTE — ED Triage Notes (Signed)
Pt requesting covid test due to being exposed to someone covid + on Sunday. Denies s/sy Chemo last on 1/13.

## 2023-11-22 NOTE — ED Provider Notes (Signed)
[facility] AT [facility] Provider Note   CSN: [id number] Arrival date & time: 11/22/23  1417     History  No chief complaint on file.   Duane [name] is a 63 y.o. male, history of colon cancer, currently going through treatment, who presents to the ED secondary to being exposed to COVID 2 days ago.  He states that, he is exposed to COVID 2 days ago, by someone who has COVID, and would like to be checked.  He denies any runny nose, sore throat, shortness of breath, chest pain, abdominal pain, nausea, or vomiting.     Home Medications Prior to Admission medications   Medication Sig Start Date End Date Taking? Authorizing Provider  amLODipine (NORVASC) 10 MG tablet Take 1 tablet (10 mg total) by mouth daily. 06/13/23   [name], NP  capecitabine (XELODA) 500 MG tablet Take 3 tablets (1500 mg total) by mouth in morning and 4 tablets (2000 mg total) in evening. Take within 30 minutes after meals. Take for 14 days, then off for 7 days. Repeat every 21 days. 11/08/23   Carlean Jews, NP  diclofenac Sodium (VOLTAREN) 1 % GEL Research Patient: Apply 0.5 grams (1 fingertip) to each hand and each foot twice daily for up to 12 weeks 09/09/23   Malachy Mood, MD  lidocaine-prilocaine (EMLA) cream Apply to affected area once 08/31/23   Malachy Mood, MD  metFORMIN (GLUCOPHAGE) 500 MG tablet TAKE 1 TABLET BY MOUTH TWICE DAILY WITH A MEAL 10/05/23   [name], NP  ondansetron (ZOFRAN) 8 MG tablet Take 1 tablet (8 mg total) by mouth every 8 (eight) hours as needed for nausea or vomiting. Start on the third day after chemotherapy. 08/31/23   Malachy Mood, MD  pravastatin (PRAVACHOL) 20 MG tablet Take 1 tablet by mouth once daily 10/05/23   [name], NP  prochlorperazine (COMPAZINE) 10 MG tablet Take 1 tablet (10 mg total) by mouth every 6 (six) hours as needed for nausea or vomiting. Patient not taking: Reported on 09/05/2023 08/31/23   Malachy Mood, MD       Allergies    Codeine    Review of Systems   Review of Systems  Constitutional:  Negative for fever.  Respiratory:  Negative for shortness of breath.     Physical Exam Updated Vital Signs BP 135/87 (BP Location: Right Arm)   Pulse 84   Temp 98.1 F (36.7 C) (Oral)   Resp 20   Ht 6\' 2"  (1.88 m)   Wt 96 kg   SpO2 100%   BMI 27.17 kg/m  Physical Exam Vitals and nursing note reviewed.  Constitutional:      General: He is not in acute distress.    Appearance: He is well-developed.  HENT:     Head: Normocephalic and atraumatic.  Eyes:     Conjunctiva/sclera: Conjunctivae normal.  Cardiovascular:     Rate and Rhythm: Normal rate and regular rhythm.     Heart sounds: No murmur heard. Pulmonary:     Effort: Pulmonary effort is normal. No respiratory distress.     Breath sounds: Normal breath sounds.  Abdominal:     Palpations: Abdomen is soft.     Tenderness: There is no abdominal tenderness.  Musculoskeletal:        General: No swelling.     Cervical back: Neck supple.  Skin:    General: Skin is warm and dry.     Capillary Refill:  Capillary refill takes less than 2 seconds.  Neurological:     Mental Status: He is alert.  Psychiatric:        Mood and Affect: Mood normal.     ED Results / Procedures / Treatments   Labs (all labs ordered are listed, but only abnormal results are displayed) Labs Reviewed  RESP PANEL BY RT-PCR (RSV, FLU A&B, COVID)  [id number]    EKG None  Radiology No results found.  Procedures Procedures    Medications Ordered in ED Medications - No data to display  ED Course/ Medical Decision Making/ A&P                                 Medical Decision Making Patient is a 63 year old male, history of colon cancer, currently going through chemotherapy, that was exposed, to COVID 2 days prior.  He would like to be tested for COVID.  He has no symptoms.  Testing was negative, there is no acute findings of COVID, however I informed him,  that he can still develop symptoms, and to reach out to his PCP or oncologist, regarding progressively treatment for this.  We discussed return precautions and he was discharged home    Final Clinical Impression(s) / ED Diagnoses Final diagnoses:  Viral disease exposure    Rx / DC Orders ED Discharge Orders     None         Kean Gautreau, Harley [place], PA 11/22/23 1657    Lorre Nick, MD 11/23/23 1215

## 2023-11-25 ENCOUNTER — Other Ambulatory Visit: Payer: Self-pay

## 2023-11-25 NOTE — Progress Notes (Signed)
Specialty Pharmacy Refill Coordination Note  Duane [name] is a 63 y.o. male contacted today regarding refills of specialty medication(s) Capecitabine (XELODA)   Patient requested Delivery   Delivery date: 12/01/23   Verified address: [address] [name], [place]   Medication will be filled on 11/30/23.

## 2023-11-30 ENCOUNTER — Other Ambulatory Visit: Payer: Self-pay

## 2023-12-04 ENCOUNTER — Other Ambulatory Visit: Payer: Self-pay | Admitting: Nurse Practitioner

## 2023-12-04 DIAGNOSIS — C187 Malignant neoplasm of sigmoid colon: Secondary | ICD-10-CM

## 2023-12-04 NOTE — Assessment & Plan Note (Signed)
pT4bN0M0, MMR normal, G2 -Patient presented with perforated sigmoid colon and a contained abscess required IR drainage tube placement. -He underwent left hemicolectomy and partial small bowel resection. 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 There was direct invasion into small intestine.  All surgical margins were negative. -Due to the high risk of recurrence given T4b lesion and bowel perforation, Adjuvant chemotherapy FOLFOX or CAPOX was recommended for 6 months if he can tolerate. He opted CAPOX and started on 09/12/2023  -12/05/2023 -he presents for cycle 5 day 1 oxaliplatin.  Taking capecitabine at home.  Due for restaging CT scan March 2025.

## 2023-12-04 NOTE — Progress Notes (Unsigned)
Patient Care Team: [name], NP as PCP - General (Internal Medicine) Malachy Mood, MD as Consulting Physician (Hematology and Oncology)  Clinic Day:  12/05/2023  Referring physician: Malachy Mood, MD  ASSESSMENT & PLAN:   Assessment & Plan: Cancer of sigmoid colon (HCC) pT4bN0M0, MMR normal, G2 -Patient presented with perforated sigmoid colon and a contained abscess required IR drainage tube placement. -He underwent left hemicolectomy and partial small bowel resection. 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 There was direct invasion into small intestine.  All surgical margins were negative. -Due to the high risk of recurrence given T4b lesion and bowel perforation, Adjuvant chemotherapy FOLFOX or CAPOX was recommended for 6 months if he can tolerate. He opted CAPOX and started on 09/12/2023  -12/05/2023 -he presents for cycle 5 day 1 oxaliplatin.  Taking capecitabine at home.  Due for restaging CT scan March 2025.   Plan: Labs reviewed  -CBC showing WBC 3.3; Hgb 11.3; Hct 31.9; Plt 90; Anc 1.7 -CMP - K 3.7; glucose 228; BUN 9; Creatinine 0.85; eGFR >60; Ca 9.0; AST 42; ALT 38; ALKP 68; t. Bili 1.6.   Spoke with Dr. Mosetta Putt today regarding patient platelet count of 90. He is OK to proceed with treatment today. Treatmetn parameters adjusted to say OK to treat if platelet count > 80,000.  Proceed with cycle 5 day 1 oxaliplatin. Continue capecitabine at home. He is taking 3 tablets in the morning and 4 tablets in the evening. He takes for 14 days in a row, then 7 days off.  New CT CAP ordered to assess response to treatment. Should be scheduled in first 2 weeks of March. Follow up with Dr. Mosetta Putt to discuss results the week after scan.  Labs with flush, follow up, and treatment as scheduled in 3 weeks.    The patient understands the plans discussed today and is in agreement with them.  He knows to contact our office if he develops concerns  prior to his next appointment.  I provided 25 minutes of face-to-face time during this encounter and > 50% was spent counseling as documented under my assessment and plan.    Carlean Jews, NP  [facility] - A DEPT OF [facility] [address] [place] [address] Dept: [phone] Dept Fax: [phone]   Orders Placed This Encounter  Procedures   CT CHEST ABDOMEN PELVIS W CONTRAST    Standing Status:   Future    Expected Date:   01/09/2024    Expiration Date:   12/04/2024    If indicated for the ordered procedure, I authorize the administration of contrast media per Radiology protocol:   Yes    Does the patient have a contrast media/X-ray dye allergy?:   No    Preferred imaging location?:   [facility]    If indicated for the ordered procedure, I authorize the administration of oral contrast media per Radiology protocol:   Yes      CHIEF COMPLAINT:  CC: Cancer of sigmoid colon  Current Treatment: Adjuvant chemotherapy CapeOx every 3 weeks  INTERVAL HISTORY:  Kyandre is here today for repeat clinical assessment.  He was last seen by Dr. Mosetta Putt on 11/14/2023.  Has been having cold sensitivity in the immediate post chemotherapy..  this is lasting for approximately 7 to 10 days. He is able to touch things that are cold, but unable to  hold them. He is also unable to eat or drink cold items. Again, this lasts for the first 7 to 10 days after treatment.  He does not not report neuropathy. Today is cycle 5 day 1 of oxaliplatin.  Continues capecitabine at home.  Restaging CT CAP should be scheduled for March 2025.  Did go to ED on 11/22/2023 due to exposure to COVID 2 days prior.  He was asymptomatic at that time.  Testing results were negative. He denies chest pain, chest pressure, or shortness of breath. He denies headaches or visual disturbances. He denies abdominal pain, nausea, vomiting, or changes in bowel or  bladder habits.  He denies fevers or chills. He denies pain. He reports that his appetite is good. His weight has decreased 2 pounds over last 3 weeks .  I have reviewed the past medical history, past surgical history, social history and family history with the patient and they are unchanged from previous note.  ALLERGIES:  is allergic to codeine.  MEDICATIONS:  Current Outpatient Medications  Medication Sig Dispense Refill   amLODipine (NORVASC) 10 MG tablet Take 1 tablet (10 mg total) by mouth daily. 90 tablet 1   capecitabine (XELODA) 500 MG tablet Take 3 tablets (1500 mg total) by mouth in morning and 4 tablets (2000 mg total) in evening. Take within 30 minutes after meals. Take for 14 days, then off for 7 days. Repeat every 21 days. 98 tablet 1   lidocaine-prilocaine (EMLA) cream Apply to affected area once 30 g 3   metFORMIN (GLUCOPHAGE) 500 MG tablet TAKE 1 TABLET BY MOUTH TWICE DAILY WITH A MEAL 180 tablet 0   ondansetron (ZOFRAN) 8 MG tablet Take 1 tablet (8 mg total) by mouth every 8 (eight) hours as needed for nausea or vomiting. Start on the third day after chemotherapy. 30 tablet 1   pravastatin (PRAVACHOL) 20 MG tablet Take 1 tablet by mouth once daily 90 tablet 0   prochlorperazine (COMPAZINE) 10 MG tablet Take 1 tablet (10 mg total) by mouth every 6 (six) hours as needed for nausea or vomiting. 30 tablet 1   diclofenac Sodium (VOLTAREN) 1 % GEL Research Patient: Apply 0.5 grams (1 fingertip) to each hand and each foot twice daily for up to 12 weeks 400 g 0   No current facility-administered medications for this visit.   Facility-Administered Medications Ordered in Other Visits  Medication Dose Route Frequency Provider Last Rate Last Admin   dextrose 5 % solution   Intravenous Continuous Malachy Mood, MD 10 mL/hr at 12/05/23 0925 New Bag at 12/05/23 0925   oxaliplatin (ELOXATIN) 300 mg in dextrose 5 % 500 mL chemo infusion  130 mg/m2 (Treatment Plan Recorded) Intravenous Once Malachy Mood, MD        HISTORY OF PRESENT ILLNESS:   Oncology History  Cancer of sigmoid colon (HCC)  05/21/2023 Imaging   CT ABDOMEN PELVIS W CONTRAST   IMPRESSION: 1. Sigmoid colon bowel wall thickening may reflect a colonic mass or focal diverticulitis. Multiple rim enhancing fluid collections in the abdomen or pelvis likely reflect abscesses and suggest bowel perforation due to the colonic mass/focal diverticulitis. 2. Multiple loops of dilated small bowel with a possible transition to decompressed small bowel in the lower right abdomen. This may reflect ileus or small-bowel obstruction and is likely related to the multiple abdominal abscesses/intraperitoneal inflammatory process.   06/03/2023 Imaging   CT ABDOMEN PELVIS W CONTRAST    IMPRESSION: 1. Interval placement of midline anterior and left  transgluteal approach pelvic drains. The abscesses have resolved. 2. Masslike thickening of the sigmoid colon remains highly suspicious for primary neoplasm. 3. Soft tissue nodular implant in the right lower quadrant measuring 1.5 cm is suspicious for metastatic implant.     06/08/2023 Pathology Results   Diagnosis 1. Colon, polyp(s), transverse and ascending (3) - TUBULAR ADENOMA, FRAGMENTS. 2. Colon, polyp(s), sigmoid and descending (2) - TUBULOVILLOUS/TUBULAR ADENOMA, FRAGMENTS. 3. 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 NOTE: IT IS NOTED THAT THIS IS SUBMITTED AS A MASS. WHILE THERE IS FOCAL SPLAYING OF SMOOTH MUSCLE DEFINITIVE EVIDENCE OF INVASION INCLUDING DESMOPLASIA OR THE PRESENCE OF SUBMUCOSAL TISSUE IS NOT PRESENT IN THESE SUPERFICIAL BIOPSIES. CLINICAL/ENDOSCOPIC CORRELATION NECESSARY. 4. Rectum, polyp(s), (2) - TUBULOVILLOUS ADENOMA, FRAGMENTS.   06/30/2023 PET scan   NM PET IMAGE initial (PI) SKULL BASE TO THIGH  IMPRESSION: 1. Hypermetabolic mass in the proximal sigmoid colon consistent with primary  colorectal carcinoma. 2. No evidence of metastatic adenopathy or peritoneal metastasis. No liver metastasis or distant metastasis   07/10/2023 Initial Diagnosis   Cancer of sigmoid colon [facility])    Imaging     08/15/2023 Cancer Staging   Staging form: Colon and Rectum, AJCC 8th Edition - Pathologic stage from 08/15/2023: Stage IIC (pT4b, pN0, cM0) - Signed by Malachy Mood, MD on 08/30/2023 Total positive nodes: 0 Histologic grading system: 4 grade system Histologic grade (G): G2 Residual tumor (R): R0 - None   09/12/2023 -  Chemotherapy   Patient is on Treatment Plan : COLORECTAL Xelox (Capeox)(130/850) q21d         REVIEW OF SYSTEMS:   Constitutional: Denies fevers, chills or abnormal weight loss Eyes: Denies blurriness of vision Ears, nose, mouth, throat, and face: Denies mucositis or sore throat Respiratory: Denies cough, dyspnea or wheezes Cardiovascular: Denies palpitation, chest discomfort or lower extremity swelling Gastrointestinal:  Denies nausea, heartburn or change in bowel habits Skin: Denies abnormal skin rashes Lymphatics: Denies new lymphadenopathy or easy bruising Neurological:Denies numbness, tingling or new weaknesses. Unable to touch anything cold and hold onto it for 7 to 10 days after treatment. Avoids eating and drinking anything cold for that same period of time. This does improve.  Behavioral/Psych: Mood is stable, no new changes  All other systems were reviewed with the patient and are negative.   VITALS:   Today's Vitals   12/05/23 0844 12/05/23 0847  BP: 131/81   Pulse: 79   Resp: 16   Temp: 98.3 F (36.8 C)   TempSrc: Temporal   SpO2: 100%   Weight: 209 lb 6.4 oz (95 kg)   PainSc:  0-No pain   Body mass index is 26.89 kg/m.    Wt Readings from Last 3 Encounters:  12/05/23 209 lb 6.4 oz (95 kg)  11/22/23 211 lb 10.3 oz (96 kg)  11/14/23 213 lb (96.6 kg)    Body mass index is 26.89 kg/m.  Performance status (ECOG): 1 - Symptomatic but  completely ambulatory  PHYSICAL EXAM:   GENERAL:alert, no distress and comfortable SKIN: skin color, texture, turgor are normal, no rashes or significant lesions EYES: normal, Conjunctiva are pink and non-injected, sclera clear OROPHARYNX:no exudate, no erythema and lips, buccal mucosa, and tongue normal  NECK: supple, thyroid normal size, non-tender, without nodularity LYMPH:  no palpable lymphadenopathy in the cervical, axillary or inguinal LUNGS: clear to auscultation and percussion with normal breathing effort HEART: regular rate & rhythm and no murmurs and no lower extremity edema ABDOMEN:abdomen  soft, non-tender and normal bowel sounds Musculoskeletal:no cyanosis of digits and no clubbing  NEURO: alert & oriented x 3 with fluent speech, no focal motor/sensory deficits  LABORATORY DATA:  I have reviewed the data as listed    Component Value Date/Time   NA 137 12/05/2023 0826   NA 141 06/13/2023 1440   K 3.7 12/05/2023 0826   CL 107 12/05/2023 0826   CO2 25 12/05/2023 0826   GLUCOSE 228 (H) 12/05/2023 0826   BUN 9 12/05/2023 0826   BUN 6 (L) 06/13/2023 1440   CREATININE 0.85 12/05/2023 0826   CALCIUM 9.0 12/05/2023 0826   PROT 6.8 12/05/2023 0826   PROT 6.8 06/13/2023 1440   ALBUMIN 3.6 12/05/2023 0826   ALBUMIN 3.8 (L) 06/13/2023 1440   AST 42 (H) 12/05/2023 0826   ALT 38 12/05/2023 0826   ALKPHOS 68 12/05/2023 0826   BILITOT 1.6 (H) 12/05/2023 0826   GFRNONAA >60 12/05/2023 0826     Lab Results  Component Value Date   WBC 3.3 (L) 12/05/2023   NEUTROABS 1.7 12/05/2023   HGB 11.3 (L) 12/05/2023   HCT 31.9 (L) 12/05/2023   MCV 90.6 12/05/2023   PLT 90 (L) 12/05/2023

## 2023-12-05 ENCOUNTER — Inpatient Hospital Stay: Payer: Commercial Managed Care - HMO

## 2023-12-05 ENCOUNTER — Encounter: Payer: Self-pay | Admitting: Nurse Practitioner

## 2023-12-05 ENCOUNTER — Inpatient Hospital Stay: Payer: Commercial Managed Care - HMO | Attending: Hematology

## 2023-12-05 ENCOUNTER — Inpatient Hospital Stay (HOSPITAL_BASED_OUTPATIENT_CLINIC_OR_DEPARTMENT_OTHER): Payer: Commercial Managed Care - HMO | Admitting: Nurse Practitioner

## 2023-12-05 VITALS — BP 131/81 | HR 79 | Temp 98.3°F | Resp 16 | Wt 209.4 lb

## 2023-12-05 DIAGNOSIS — Z79899 Other long term (current) drug therapy: Secondary | ICD-10-CM | POA: Insufficient documentation

## 2023-12-05 DIAGNOSIS — C187 Malignant neoplasm of sigmoid colon: Secondary | ICD-10-CM

## 2023-12-05 DIAGNOSIS — Z5111 Encounter for antineoplastic chemotherapy: Secondary | ICD-10-CM | POA: Insufficient documentation

## 2023-12-05 DIAGNOSIS — Z95828 Presence of other vascular implants and grafts: Secondary | ICD-10-CM

## 2023-12-05 LAB — CMP (CANCER CENTER ONLY)
ALT: 38 U/L (ref 0–44)
AST: 42 U/L — ABNORMAL HIGH (ref 15–41)
Albumin: 3.6 g/dL (ref 3.5–5.0)
Alkaline Phosphatase: 68 U/L (ref 38–126)
Anion gap: 5 (ref 5–15)
BUN: 9 mg/dL (ref 8–23)
CO2: 25 mmol/L (ref 22–32)
Calcium: 9 mg/dL (ref 8.9–10.3)
Chloride: 107 mmol/L (ref 98–111)
Creatinine: 0.85 mg/dL (ref 0.61–1.24)
GFR, Estimated: >60 mL/min (ref >60)
Glucose, Bld: 228 mg/dL — ABNORMAL HIGH (ref 70–99)
Potassium: 3.7 mmol/L (ref 3.5–5.1)
Sodium: 137 mmol/L (ref 135–145)
Total Bilirubin: 1.6 mg/dL — ABNORMAL HIGH (ref 0.0–1.2)
Total Protein: 6.8 g/dL (ref 6.5–8.1)

## 2023-12-05 LAB — CBC WITH DIFFERENTIAL (CANCER CENTER ONLY)
Abs Immature Granulocytes: 0.01 10*3/uL (ref 0.00–0.07)
Basophils Absolute: 0 10*3/uL (ref 0.0–0.1)
Basophils Relative: 0 %
Eosinophils Absolute: 0 10*3/uL (ref 0.0–0.5)
Eosinophils Relative: 1 %
HCT: 31.9 % — ABNORMAL LOW (ref 39.0–52.0)
Hemoglobin: 11.3 g/dL — ABNORMAL LOW (ref 13.0–17.0)
Immature Granulocytes: 0 %
Lymphocytes Relative: 34 %
Lymphs Abs: 1.1 10*3/uL (ref 0.7–4.0)
MCH: 32.1 pg (ref 26.0–34.0)
MCHC: 35.4 g/dL (ref 30.0–36.0)
MCV: 90.6 fL (ref 80.0–100.0)
Monocytes Absolute: 0.4 10*3/uL (ref 0.1–1.0)
Monocytes Relative: 12 %
Neutro Abs: 1.7 10*3/uL (ref 1.7–7.7)
Neutrophils Relative %: 53 %
Platelet Count: 90 10*3/uL — ABNORMAL LOW (ref 150–400)
RBC: 3.52 MIL/uL — ABNORMAL LOW (ref 4.22–5.81)
RDW: 21.9 % — ABNORMAL HIGH (ref 11.5–15.5)
WBC Count: 3.3 10*3/uL — ABNORMAL LOW (ref 4.0–10.5)
nRBC: 0 % (ref 0.0–0.2)

## 2023-12-05 MED ORDER — DEXAMETHASONE SODIUM PHOSPHATE 10 MG/ML IJ SOLN
10.0000 mg | Freq: Once | INTRAMUSCULAR | Status: AC
  Administered 2023-12-05: 10 mg via INTRAVENOUS
  Filled 2023-12-05: qty 1

## 2023-12-05 MED ORDER — DEXTROSE 5 % IV SOLN
INTRAVENOUS | Status: DC
Start: 2023-12-05 — End: 2023-12-05

## 2023-12-05 MED ORDER — SODIUM CHLORIDE 0.9% FLUSH
10.0000 mL | Freq: Once | INTRAVENOUS | Status: AC
  Administered 2023-12-05: 10 mL

## 2023-12-05 MED ORDER — PALONOSETRON HCL INJECTION 0.25 MG/5ML
0.2500 mg | Freq: Once | INTRAVENOUS | Status: AC
  Administered 2023-12-05: 0.25 mg via INTRAVENOUS
  Filled 2023-12-05: qty 5

## 2023-12-05 MED ORDER — OXALIPLATIN CHEMO INJECTION 100 MG/20ML
130.0000 mg/m2 | Freq: Once | INTRAVENOUS | Status: AC
  Administered 2023-12-05: 300 mg via INTRAVENOUS
  Filled 2023-12-05: qty 60

## 2023-12-09 ENCOUNTER — Other Ambulatory Visit: Payer: Self-pay

## 2023-12-12 ENCOUNTER — Telehealth: Payer: Self-pay | Admitting: Nurse Practitioner

## 2023-12-12 NOTE — Telephone Encounter (Signed)
 Scheduled appointments per 2/3 los. Left VM with appointment details.

## 2023-12-13 ENCOUNTER — Other Ambulatory Visit: Payer: Self-pay

## 2023-12-15 ENCOUNTER — Other Ambulatory Visit: Payer: Self-pay

## 2023-12-16 ENCOUNTER — Other Ambulatory Visit (HOSPITAL_COMMUNITY): Payer: Self-pay

## 2023-12-17 ENCOUNTER — Encounter: Payer: Self-pay | Admitting: Hematology

## 2023-12-17 ENCOUNTER — Other Ambulatory Visit: Payer: Self-pay

## 2023-12-18 ENCOUNTER — Other Ambulatory Visit: Payer: Self-pay

## 2023-12-20 ENCOUNTER — Other Ambulatory Visit: Payer: Self-pay | Admitting: Nurse Practitioner

## 2023-12-20 ENCOUNTER — Other Ambulatory Visit: Payer: Self-pay

## 2023-12-20 DIAGNOSIS — C187 Malignant neoplasm of sigmoid colon: Secondary | ICD-10-CM

## 2023-12-20 MED ORDER — CAPECITABINE 500 MG PO TABS
ORAL_TABLET | ORAL | 1 refills | Status: DC
  Filled 2023-12-20: qty 98, 21d supply, fill #0
  Filled 2024-01-11 (×2): qty 98, 21d supply, fill #1

## 2023-12-20 NOTE — Progress Notes (Signed)
Specialty Pharmacy Refill Coordination Note  Duane [name] is a 63 y.o. male contacted today regarding refills of specialty medication(s) Capecitabine (XELODA)   Patient requested Delivery   Delivery date: 12/23/23   Verified address: [address] [name], [place]   Medication will be filled on 12/22/23.

## 2023-12-20 NOTE — Progress Notes (Signed)
Specialty Pharmacy Ongoing Clinical Assessment Note  Duane [name] is a 63 y.o. male who is being followed by the specialty pharmacy service for RxSp Oncology   Patient's specialty medication(s) reviewed today: Capecitabine (XELODA)   Missed doses in the last 4 weeks: 0   Patient/Caregiver did not have any additional questions or concerns.   Therapeutic benefit summary: Unable to assess   Adverse events/side effects summary: No adverse events/side effects   Patient's therapy is appropriate to: Continue    Goals Addressed             This Visit's Progress    Achieve a cure       Patient is unable to be assessed as therapy was recently initiated. Patient will maintain adherence          Follow up:  3 months  Otto Herb Specialty Pharmacist

## 2023-12-21 ENCOUNTER — Other Ambulatory Visit: Payer: Self-pay

## 2023-12-22 ENCOUNTER — Other Ambulatory Visit: Payer: Self-pay

## 2023-12-22 ENCOUNTER — Other Ambulatory Visit (HOSPITAL_COMMUNITY): Payer: Self-pay

## 2023-12-26 ENCOUNTER — Inpatient Hospital Stay (HOSPITAL_BASED_OUTPATIENT_CLINIC_OR_DEPARTMENT_OTHER): Payer: Commercial Managed Care - HMO | Admitting: Nurse Practitioner

## 2023-12-26 ENCOUNTER — Encounter: Payer: Self-pay | Admitting: Nurse Practitioner

## 2023-12-26 ENCOUNTER — Inpatient Hospital Stay: Payer: Commercial Managed Care - HMO

## 2023-12-26 ENCOUNTER — Other Ambulatory Visit: Payer: Self-pay

## 2023-12-26 ENCOUNTER — Encounter: Payer: Self-pay | Admitting: Hematology

## 2023-12-26 ENCOUNTER — Other Ambulatory Visit: Payer: Self-pay | Admitting: *Deleted

## 2023-12-26 ENCOUNTER — Other Ambulatory Visit: Payer: Self-pay | Admitting: Nurse Practitioner

## 2023-12-26 VITALS — BP 135/77 | HR 79 | Temp 98.2°F | Resp 18 | Ht 74.0 in | Wt 210.6 lb

## 2023-12-26 DIAGNOSIS — D500 Iron deficiency anemia secondary to blood loss (chronic): Secondary | ICD-10-CM

## 2023-12-26 DIAGNOSIS — Z5111 Encounter for antineoplastic chemotherapy: Secondary | ICD-10-CM | POA: Diagnosis not present

## 2023-12-26 DIAGNOSIS — C187 Malignant neoplasm of sigmoid colon: Secondary | ICD-10-CM

## 2023-12-26 DIAGNOSIS — Z95828 Presence of other vascular implants and grafts: Secondary | ICD-10-CM

## 2023-12-26 LAB — CMP (CANCER CENTER ONLY)
ALT: 37 U/L (ref 0–44)
AST: 43 U/L — ABNORMAL HIGH (ref 15–41)
Albumin: 3.5 g/dL (ref 3.5–5.0)
Alkaline Phosphatase: 69 U/L (ref 38–126)
Anion gap: 4 — ABNORMAL LOW (ref 5–15)
BUN: 9 mg/dL (ref 8–23)
CO2: 27 mmol/L (ref 22–32)
Calcium: 9.1 mg/dL (ref 8.9–10.3)
Chloride: 109 mmol/L (ref 98–111)
Creatinine: 0.87 mg/dL (ref 0.61–1.24)
GFR, Estimated: >60 mL/min (ref >60)
Glucose, Bld: 257 mg/dL — ABNORMAL HIGH (ref 70–99)
Potassium: 3.6 mmol/L (ref 3.5–5.1)
Sodium: 140 mmol/L (ref 135–145)
Total Bilirubin: 2.1 mg/dL — ABNORMAL HIGH (ref 0.0–1.2)
Total Protein: 6.8 g/dL (ref 6.5–8.1)

## 2023-12-26 LAB — CBC WITH DIFFERENTIAL (CANCER CENTER ONLY)
Abs Immature Granulocytes: 0.01 10*3/uL (ref 0.00–0.07)
Basophils Absolute: 0 10*3/uL (ref 0.0–0.1)
Basophils Relative: 0 %
Eosinophils Absolute: 0.1 10*3/uL (ref 0.0–0.5)
Eosinophils Relative: 2 %
HCT: 29.2 % — ABNORMAL LOW (ref 39.0–52.0)
Hemoglobin: 10.4 g/dL — ABNORMAL LOW (ref 13.0–17.0)
Immature Granulocytes: 0 %
Lymphocytes Relative: 31 %
Lymphs Abs: 1 10*3/uL (ref 0.7–4.0)
MCH: 33.7 pg (ref 26.0–34.0)
MCHC: 35.6 g/dL (ref 30.0–36.0)
MCV: 94.5 fL (ref 80.0–100.0)
Monocytes Absolute: 0.4 10*3/uL (ref 0.1–1.0)
Monocytes Relative: 12 %
Neutro Abs: 1.8 10*3/uL (ref 1.7–7.7)
Neutrophils Relative %: 55 %
Platelet Count: 77 10*3/uL — ABNORMAL LOW (ref 150–400)
RBC: 3.09 MIL/uL — ABNORMAL LOW (ref 4.22–5.81)
RDW: 20.4 % — ABNORMAL HIGH (ref 11.5–15.5)
WBC Count: 3.3 10*3/uL — ABNORMAL LOW (ref 4.0–10.5)
nRBC: 0 % (ref 0.0–0.2)

## 2023-12-26 LAB — CEA (ACCESS): CEA (CHCC): 6.65 ng/mL — ABNORMAL HIGH (ref 0.00–5.00)

## 2023-12-26 LAB — FERRITIN: Ferritin: 110 ng/mL (ref 24–336)

## 2023-12-26 MED ORDER — DEXAMETHASONE SODIUM PHOSPHATE 10 MG/ML IJ SOLN
10.0000 mg | Freq: Once | INTRAMUSCULAR | Status: AC
  Administered 2023-12-26: 10 mg via INTRAVENOUS
  Filled 2023-12-26: qty 1

## 2023-12-26 MED ORDER — SODIUM CHLORIDE 0.9% FLUSH
10.0000 mL | Freq: Once | INTRAVENOUS | Status: AC
Start: 2023-12-26 — End: 2023-12-26
  Administered 2023-12-26: 10 mL

## 2023-12-26 MED ORDER — OXALIPLATIN CHEMO INJECTION 100 MG/20ML
110.0000 mg/m2 | Freq: Once | INTRAVENOUS | Status: AC
  Administered 2023-12-26: 250 mg via INTRAVENOUS
  Filled 2023-12-26: qty 50

## 2023-12-26 MED ORDER — PALONOSETRON HCL INJECTION 0.25 MG/5ML
0.2500 mg | Freq: Once | INTRAVENOUS | Status: AC
Start: 2023-12-26 — End: 2023-12-26
  Administered 2023-12-26: 0.25 mg via INTRAVENOUS
  Filled 2023-12-26: qty 5

## 2023-12-26 MED ORDER — HEPARIN SOD (PORK) LOCK FLUSH 100 UNIT/ML IV SOLN
500.0000 [IU] | Freq: Once | INTRAVENOUS | Status: AC | PRN
  Administered 2023-12-26: 500 [IU]

## 2023-12-26 MED ORDER — DEXTROSE 5 % IV SOLN: INTRAVENOUS | Status: DC

## 2023-12-26 MED ORDER — SODIUM CHLORIDE 0.9% FLUSH
10.0000 mL | INTRAVENOUS | Status: DC | PRN
  Administered 2023-12-26 (×2): 10 mL

## 2023-12-26 NOTE — Progress Notes (Signed)
 Patient Care Team: [name], NP as PCP - General (Internal Medicine) Malachy Mood, MD as Consulting Physician (Hematology and Oncology)   CHIEF COMPLAINT: Follow-up sigmoid colon cancer  Oncology History  Cancer of sigmoid colon [name])  05/21/2023 Imaging   CT ABDOMEN PELVIS W CONTRAST   IMPRESSION: 1. Sigmoid colon bowel wall thickening may reflect a colonic mass or focal diverticulitis. Multiple rim enhancing fluid collections in the abdomen or pelvis likely reflect abscesses and suggest bowel perforation due to the colonic mass/focal diverticulitis. 2. Multiple loops of dilated small bowel with a possible transition to decompressed small bowel in the lower right abdomen. This may reflect ileus or small-bowel obstruction and is likely related to the multiple abdominal abscesses/intraperitoneal inflammatory process.   06/03/2023 Imaging   CT ABDOMEN PELVIS W CONTRAST    IMPRESSION: 1. Interval placement of midline anterior and left transgluteal approach pelvic drains. The abscesses have resolved. 2. Masslike thickening of the sigmoid colon remains highly suspicious for primary neoplasm. 3. Soft tissue nodular implant in the right lower quadrant measuring 1.5 cm is suspicious for metastatic implant.     06/08/2023 Pathology Results   Diagnosis 1. Colon, polyp(s), transverse and ascending (3) - TUBULAR ADENOMA, FRAGMENTS. 2. Colon, polyp(s), sigmoid and descending (2) - TUBULOVILLOUS/TUBULAR ADENOMA, FRAGMENTS. 3. 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 NOTE: IT IS NOTED THAT THIS IS SUBMITTED AS A MASS. WHILE THERE IS FOCAL SPLAYING OF SMOOTH MUSCLE DEFINITIVE EVIDENCE OF INVASION INCLUDING DESMOPLASIA OR THE PRESENCE OF SUBMUCOSAL TISSUE IS NOT PRESENT IN THESE SUPERFICIAL BIOPSIES. CLINICAL/ENDOSCOPIC CORRELATION NECESSARY. 4. Rectum, polyp(s), (2) - TUBULOVILLOUS ADENOMA, FRAGMENTS.    06/30/2023 PET scan   NM PET IMAGE initial (PI) SKULL BASE TO THIGH  IMPRESSION: 1. Hypermetabolic mass in the proximal sigmoid colon consistent with primary colorectal carcinoma. 2. No evidence of metastatic adenopathy or peritoneal metastasis. No liver metastasis or distant metastasis   07/10/2023 Initial Diagnosis   Cancer of sigmoid colon [name]    Imaging     08/15/2023 Cancer Staging   Staging form: Colon and Rectum, AJCC 8th Edition - Pathologic stage from 08/15/2023: Stage IIC (pT4b, pN0, cM0) - Signed by Malachy Mood, MD on 08/30/2023 Total positive nodes: 0 Histologic grading system: 4 grade system Histologic grade (G): G2 Residual tumor (R): R0 - None   09/12/2023 -  Chemotherapy   Patient is on Treatment Plan : COLORECTAL Xelox (Capeox)(130/850) q21d        CURRENT THERAPY: Adjuvant CapeOx x6 months, starting 09/12/23  INTERVAL HISTORY Duane [name] returns for follow-up and treatment as scheduled, last seen by Duane Gros, NP 12/05/2023 with cycle 5.  He tolerates treatment very well.  Cold sensitivity lasts 7-10 days then resolves without residual neuropathy in the absence of cold.  Hands are a little dark, but not red, painful, or cracking.  He is eating and drinking well with good energy.  Bowels moving normally, no nausea/vomiting or abdominal pain.  Denies bruising or bleeding, rash, mucositis, fever/chills, or any other new or specific complaints.  ROS  All other systems reviewed and negative  Past Medical History:  Diagnosis Date   Colon cancer (HCC)    Diabetes mellitus without complication (HCC) type 2    GERD (gastroesophageal reflux disease)    Hypertension      Past Surgical History:  Procedure Laterality Date   COLONOSCOPY     FLEXIBLE SIGMOIDOSCOPY N/A 08/15/2023   Procedure: FLEXIBLE SIGMOIDOSCOPY;  Surgeon: Cliffton Asters,  Stephanie Coup, MD;  Location: [facility];  Service: General;  Laterality: N/A;   IR RADIOLOGIST EVAL & MGMT  06/03/2023   LAPAROSCOPY N/A  08/15/2023   Procedure: LAPAROSCOPY DIAGNOSTIC;  Surgeon: Andria Meuse, MD;  Location: [facility];  Service: General;  Laterality: N/A;   PORTACATH PLACEMENT Right 09/09/2023   Procedure: INSERTION PORT-A-CATH ULTRASOUND FLUOROSCOPY;  Surgeon: Andria Meuse, MD;  Location: [name];  Service: General;  Laterality: Right;   XI ROBOTIC ASSISTED LOWER ANTERIOR RESECTION N/A 08/15/2023   Procedure: XI ROBOTIC ASSISTED LOWER ANTERIOR RESECTION, SMALL BOWEL RESECTION, LYSIS OF ADHESIONS, AND INTRAOPERATIVE ASSESSMENT OF PERFUSION USING ICG DYE;  Surgeon: Andria Meuse, MD;  Location: [facility];  Service: General;  Laterality: N/A;     Outpatient Encounter Medications as of 12/26/2023  Medication Sig   amLODipine (NORVASC) 10 MG tablet Take 1 tablet (10 mg total) by mouth daily.   capecitabine (XELODA) 500 MG tablet Take 3 tablets (1500 mg total) by mouth in morning and 4 tablets (2000 mg total) in evening. Take within 30 minutes after meals. Take for 14 days, then off for 7 days. Repeat every 21 days.   diclofenac Sodium (VOLTAREN) 1 % GEL Research Patient: Apply 0.5 grams (1 fingertip) to each hand and each foot twice daily for up to 12 weeks   lidocaine-prilocaine (EMLA) cream Apply to affected area once   metFORMIN (GLUCOPHAGE) 500 MG tablet TAKE 1 TABLET BY MOUTH TWICE DAILY WITH A MEAL   ondansetron (ZOFRAN) 8 MG tablet Take 1 tablet (8 mg total) by mouth every 8 (eight) hours as needed for nausea or vomiting. Start on the third day after chemotherapy.   pravastatin (PRAVACHOL) 20 MG tablet Take 1 tablet by mouth once daily   prochlorperazine (COMPAZINE) 10 MG tablet Take 1 tablet (10 mg total) by mouth every 6 (six) hours as needed for nausea or vomiting.   No facility-administered encounter medications on file as of 12/26/2023.     Today's Vitals   12/26/23 0851 12/26/23 0855  BP: 135/77   Pulse: 79   Resp: 18   Temp: 98.2 F (36.8 C)   TempSrc: Tympanic    SpO2: 99%   Weight: 210 lb 9.6 oz (95.5 kg)   Height: 6\' 2"  (1.88 m)   PainSc:  0-No pain   Body mass index is 27.04 kg/m.   PHYSICAL EXAM GENERAL:alert, no distress and comfortable SKIN: no rash  EYES: sclera clear NECK: without mass LYMPH:  no palpable cervical or supraclavicular lymphadenopathy  LUNGS: clear with normal breathing effort HEART: regular rate & rhythm, no lower extremity edema ABDOMEN: abdomen soft, non-tender and normal bowel sounds NEURO: alert & oriented x 3 with fluent speech, no focal motor/sensory deficits PAC without erythema    CBC    Component Value Date/Time   WBC 3.3 (L) 12/26/2023 0834   WBC 5.6 08/31/2023 1035   RBC 3.09 (L) 12/26/2023 0834   HGB 10.4 (L) 12/26/2023 0834   HGB 13.7 12/23/2022 1202   HCT 29.2 (L) 12/26/2023 0834   HCT 42.1 12/23/2022 1202   PLT 77 (L) 12/26/2023 0834   PLT 239 12/23/2022 1202   MCV 94.5 12/26/2023 0834   MCV 86 12/23/2022 1202   MCH 33.7 12/26/2023 0834   MCHC 35.6 12/26/2023 0834   RDW 20.4 (H) 12/26/2023 0834   RDW 16.3 (H) 12/23/2022 1202   LYMPHSABS 1.0 12/26/2023 0834   LYMPHSABS 2.5 12/23/2022 1202   MONOABS 0.4 12/26/2023 0834  EOSABS 0.1 12/26/2023 0834   EOSABS 0.1 12/23/2022 1202   BASOSABS 0.0 12/26/2023 0834   BASOSABS 0.1 12/23/2022 1202     CMP     Component Value Date/Time   NA 137 12/05/2023 0826   NA 141 06/13/2023 1440   K 3.7 12/05/2023 0826   CL 107 12/05/2023 0826   CO2 25 12/05/2023 0826   GLUCOSE 228 (H) 12/05/2023 0826   BUN 9 12/05/2023 0826   BUN 6 (L) 06/13/2023 1440   CREATININE 0.85 12/05/2023 0826   CALCIUM 9.0 12/05/2023 0826   PROT 6.8 12/05/2023 0826   PROT 6.8 06/13/2023 1440   ALBUMIN 3.6 12/05/2023 0826   ALBUMIN 3.8 (L) 06/13/2023 1440   AST 42 (H) 12/05/2023 0826   ALT 38 12/05/2023 0826   ALKPHOS 68 12/05/2023 0826   BILITOT 1.6 (H) 12/05/2023 0826   GFRNONAA >60 12/05/2023 0826     ASSESSMENT & PLAN: 63 yo male  Cancer of sigmoid colon,  pT4bN0M0, MMR normal, G2 -Patient presented with perforated sigmoid colon and a contained abscess required IR drainage tube placement. -S/p left hemicolectomy and partial small bowel resection, surgical path showed grade 2 adenocarcinoma in the sigmoid colon, was direct invasion into small intestine.  All surgical margins were negative. -Due to the high risk of recurrence given T4b lesion and bowel perforation, the recommendation is for adjuvant chemotherapy FOLFOX or CAPOX for 6 months if he can tolerate. He opted CAPOX and started on 09/12/2023, s/p 5 cycles -Duane [name] appears stable.  He is tolerating treatment very well overall with mild cold sensitivity and progressive thrombocytopenia without bruising/bleeding.  -He is able to recover and function well with good performance status.  There is no clinical evidence of disease recurrence.   -Labs reviewed, platelet 77K.  CMP is pending -Proceed with cycle 6 CapeOx, same dose Xeloda which he began today. Will dose reduce Oxaliplatin to 110 mg/m2 -Surveillance CT scheduled for 01/09/2024, then follow-up with cycle 7 a week later   PLAN: -Labs reviewed, platelet 77K.  CMP is pending -Proceed with cycle 6 CapeOx, same dose Xeloda which he began today  -Will dose reduce Oxaliplatin to 110 mg/m2 -Surveillance CT scheduled for 01/09/2024, then follow-up with cycle 7 in 3 weeks -Case discussed with Dr. Mosetta Putt   Orders Placed This Encounter  Procedures   CBC with Differential ([name] Only)    Standing Status:   Future    Expected Date:   01/16/2024    Expiration Date:   01/15/2025   CMP ([name] only)    Standing Status:   Future    Expected Date:   01/16/2024    Expiration Date:   01/15/2025   CBC with Differential ([name] Only)    Standing Status:   Future    Expected Date:   02/06/2024    Expiration Date:   02/05/2025   CMP ([name] only)    Standing Status:   Future    Expected Date:   02/06/2024    Expiration Date:    02/05/2025      All questions were answered. The patient knows to call the clinic with any problems, questions or concerns. No barriers to learning were detected.   Santiago Glad, NP-C 12/26/2023

## 2023-12-26 NOTE — Patient Instructions (Signed)
 [facility]  Discharge Instructions: Thank you for choosing [facility] to provide your oncology and hematology care.   If you have a lab appointment with the Cancer Center, please go directly to the Cancer Center and check in at the registration area.   Wear comfortable clothing and clothing appropriate for easy access to any Portacath or PICC line.   We strive to give you quality time with your provider. You may need to reschedule your appointment if you arrive late (15 or more minutes).  Arriving late affects you and other patients whose appointments are after yours.  Also, if you miss three or more appointments without notifying the office, you may be dismissed from the clinic at the provider's discretion.      For prescription refill requests, have your pharmacy contact our office and allow 72 hours for refills to be completed.    Today you received the following chemotherapy and/or immunotherapy agents: Oxaliplatin      To help prevent nausea and vomiting after your treatment, we encourage you to take your nausea medication as directed.  BELOW ARE SYMPTOMS THAT SHOULD BE REPORTED IMMEDIATELY: *FEVER GREATER THAN 100.4 F (38 C) OR HIGHER *CHILLS OR SWEATING *NAUSEA AND VOMITING THAT IS NOT CONTROLLED WITH YOUR NAUSEA MEDICATION *UNUSUAL SHORTNESS OF BREATH *UNUSUAL BRUISING OR BLEEDING *URINARY PROBLEMS (pain or burning when urinating, or frequent urination) *BOWEL PROBLEMS (unusual diarrhea, constipation, pain near the anus) TENDERNESS IN MOUTH AND THROAT WITH OR WITHOUT PRESENCE OF ULCERS (sore throat, sores in mouth, or a toothache) UNUSUAL RASH, SWELLING OR PAIN  UNUSUAL VAGINAL DISCHARGE OR ITCHING   Items with * indicate a potential emergency and should be followed up as soon as possible or go to the Emergency Department if any problems should occur.  Please show the CHEMOTHERAPY ALERT CARD or  IMMUNOTHERAPY ALERT CARD at check-in to the Emergency Department and triage nurse.  Should you have questions after your visit or need to cancel or reschedule your appointment, please contact [facility]  Dept: [phone]  and follow the prompts.  Office hours are 8:00 a.m. to 4:30 p.m. Monday - Friday. Please note that voicemails left after 4:00 p.m. may not be returned until the following business day.  We are closed weekends and major holidays. You have access to a nurse at all times for urgent questions. Please call the main number to the clinic Dept: [phone] and follow the prompts.   For any non-urgent questions, you may also contact your provider using MyChart. We now offer e-Visits for anyone 36 and older to request care online for non-urgent symptoms. For details visit [email or web address].   Also download the MyChart app! Go to the app store, search "MyChart", open the app, select [place], and log in with your MyChart username and password.

## 2024-01-02 ENCOUNTER — Other Ambulatory Visit (INDEPENDENT_AMBULATORY_CARE_PROVIDER_SITE_OTHER): Payer: Self-pay | Admitting: Primary Care

## 2024-01-02 DIAGNOSIS — I10 Essential (primary) hypertension: Secondary | ICD-10-CM

## 2024-01-02 DIAGNOSIS — Z760 Encounter for issue of repeat prescription: Secondary | ICD-10-CM

## 2024-01-03 ENCOUNTER — Other Ambulatory Visit (INDEPENDENT_AMBULATORY_CARE_PROVIDER_SITE_OTHER): Payer: Self-pay | Admitting: Primary Care

## 2024-01-03 DIAGNOSIS — I10 Essential (primary) hypertension: Secondary | ICD-10-CM

## 2024-01-03 DIAGNOSIS — Z760 Encounter for issue of repeat prescription: Secondary | ICD-10-CM

## 2024-01-05 ENCOUNTER — Other Ambulatory Visit: Payer: Self-pay

## 2024-01-09 ENCOUNTER — Other Ambulatory Visit (HOSPITAL_COMMUNITY): Payer: Self-pay

## 2024-01-09 ENCOUNTER — Ambulatory Visit (HOSPITAL_COMMUNITY)
Admission: RE | Admit: 2024-01-09 | Discharge: 2024-01-09 | Disposition: A | Discharge status: Home / Self Care | Payer: Commercial Managed Care - HMO | Source: Ambulatory Visit | Attending: Nurse Practitioner | Admitting: Nurse Practitioner

## 2024-01-09 ENCOUNTER — Encounter (HOSPITAL_COMMUNITY): Payer: Self-pay

## 2024-01-09 DIAGNOSIS — C187 Malignant neoplasm of sigmoid colon: Secondary | ICD-10-CM | POA: Insufficient documentation

## 2024-01-09 MED ORDER — IOHEXOL 300 MG/ML  SOLN
30.0000 mL | Freq: Once | INTRAMUSCULAR | Status: AC | PRN
  Administered 2024-01-09: 30 mL via ORAL

## 2024-01-09 MED ORDER — IOHEXOL 300 MG/ML  SOLN
100.0000 mL | Freq: Once | INTRAMUSCULAR | Status: AC | PRN
  Administered 2024-01-09: 100 mL via INTRAVENOUS

## 2024-01-11 ENCOUNTER — Other Ambulatory Visit (HOSPITAL_COMMUNITY): Payer: Self-pay

## 2024-01-11 ENCOUNTER — Other Ambulatory Visit: Payer: Self-pay

## 2024-01-11 NOTE — Progress Notes (Signed)
 Specialty Pharmacy Refill Coordination Note  Duane [name] is a 63 y.o. male contacted today regarding refills of specialty medication(s) Capecitabine (XELODA)   Patient requested Delivery   Delivery date: 01/13/24   Verified address: [address] [place],  [place] [address]   Medication will be filled on 01/12/24.

## 2024-01-12 ENCOUNTER — Other Ambulatory Visit (HOSPITAL_COMMUNITY): Payer: Self-pay

## 2024-01-15 NOTE — Assessment & Plan Note (Addendum)
 pT4bN0M0, MMR normal, G2 -Patient presented with perforated sigmoid colon and a contained abscess required IR drainage tube placement. -He underwent left hemicolectomy and partial small bowel resection.  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  All surgical margins were negative. -Due to the high risk of recurrence given T4b lesion and bowel perforation, I recommend adjuvant chemotherapy FOLFOX or CAPOX for 6 months if he can tolerate. He opted CAPOX and started on 09/12/2023.   -Surveillance CT scan on January 09, 2024 was negative for recurrence.

## 2024-01-16 ENCOUNTER — Inpatient Hospital Stay (HOSPITAL_BASED_OUTPATIENT_CLINIC_OR_DEPARTMENT_OTHER): Payer: Commercial Managed Care - HMO | Admitting: Hematology

## 2024-01-16 ENCOUNTER — Inpatient Hospital Stay: Payer: Commercial Managed Care - HMO | Attending: Hematology

## 2024-01-16 ENCOUNTER — Inpatient Hospital Stay: Payer: Commercial Managed Care - HMO

## 2024-01-16 ENCOUNTER — Encounter: Payer: Self-pay | Admitting: Hematology

## 2024-01-16 VITALS — BP 118/58 | HR 86 | Temp 97.8°F | Resp 20 | Ht 74.0 in | Wt 212.2 lb

## 2024-01-16 VITALS — BP 122/80 | HR 69 | Temp 98.1°F | Resp 16

## 2024-01-16 DIAGNOSIS — C187 Malignant neoplasm of sigmoid colon: Secondary | ICD-10-CM

## 2024-01-16 DIAGNOSIS — Z5111 Encounter for antineoplastic chemotherapy: Secondary | ICD-10-CM | POA: Diagnosis present

## 2024-01-16 DIAGNOSIS — Z79899 Other long term (current) drug therapy: Secondary | ICD-10-CM | POA: Diagnosis not present

## 2024-01-16 DIAGNOSIS — Z95828 Presence of other vascular implants and grafts: Secondary | ICD-10-CM

## 2024-01-16 LAB — CBC WITH DIFFERENTIAL (CANCER CENTER ONLY)
Abs Immature Granulocytes: 0.04 10*3/uL (ref 0.00–0.07)
Basophils Absolute: 0 10*3/uL (ref 0.0–0.1)
Basophils Relative: 0 %
Eosinophils Absolute: 0 10*3/uL (ref 0.0–0.5)
Eosinophils Relative: 1 %
HCT: 30 % — ABNORMAL LOW (ref 39.0–52.0)
Hemoglobin: 10.6 g/dL — ABNORMAL LOW (ref 13.0–17.0)
Immature Granulocytes: 1 %
Lymphocytes Relative: 33 %
Lymphs Abs: 1 10*3/uL (ref 0.7–4.0)
MCH: 34.4 pg — ABNORMAL HIGH (ref 26.0–34.0)
MCHC: 35.3 g/dL (ref 30.0–36.0)
MCV: 97.4 fL (ref 80.0–100.0)
Monocytes Absolute: 0.4 10*3/uL (ref 0.1–1.0)
Monocytes Relative: 13 %
Neutro Abs: 1.6 10*3/uL — ABNORMAL LOW (ref 1.7–7.7)
Neutrophils Relative %: 52 %
Platelet Count: 92 10*3/uL — ABNORMAL LOW (ref 150–400)
RBC: 3.08 MIL/uL — ABNORMAL LOW (ref 4.22–5.81)
RDW: 17.8 % — ABNORMAL HIGH (ref 11.5–15.5)
WBC Count: 3.1 10*3/uL — ABNORMAL LOW (ref 4.0–10.5)
nRBC: 0 % (ref 0.0–0.2)

## 2024-01-16 LAB — CMP (CANCER CENTER ONLY)
ALT: 35 U/L (ref 0–44)
AST: 42 U/L — ABNORMAL HIGH (ref 15–41)
Albumin: 3.6 g/dL (ref 3.5–5.0)
Alkaline Phosphatase: 79 U/L (ref 38–126)
Anion gap: 4 — ABNORMAL LOW (ref 5–15)
BUN: 8 mg/dL (ref 8–23)
CO2: 26 mmol/L (ref 22–32)
Calcium: 8.5 mg/dL — ABNORMAL LOW (ref 8.9–10.3)
Chloride: 107 mmol/L (ref 98–111)
Creatinine: 0.83 mg/dL (ref 0.61–1.24)
GFR, Estimated: >60 mL/min (ref >60)
Glucose, Bld: 253 mg/dL — ABNORMAL HIGH (ref 70–99)
Potassium: 3.6 mmol/L (ref 3.5–5.1)
Sodium: 137 mmol/L (ref 135–145)
Total Bilirubin: 1.7 mg/dL — ABNORMAL HIGH (ref 0.0–1.2)
Total Protein: 6.8 g/dL (ref 6.5–8.1)

## 2024-01-16 MED ORDER — PALONOSETRON HCL INJECTION 0.25 MG/5ML
0.2500 mg | Freq: Once | INTRAVENOUS | Status: AC
  Administered 2024-01-16: 0.25 mg via INTRAVENOUS
  Filled 2024-01-16: qty 5

## 2024-01-16 MED ORDER — DEXTROSE 5 % IV SOLN
INTRAVENOUS | Status: DC
Start: 2024-01-16 — End: 2024-01-16

## 2024-01-16 MED ORDER — HEPARIN SOD (PORK) LOCK FLUSH 100 UNIT/ML IV SOLN
500.0000 [IU] | Freq: Once | INTRAVENOUS | Status: AC | PRN
Start: 2024-01-16 — End: 2024-01-16
  Administered 2024-01-16: 500 [IU]

## 2024-01-16 MED ORDER — OXALIPLATIN CHEMO INJECTION 100 MG/20ML
110.0000 mg/m2 | Freq: Once | INTRAVENOUS | Status: AC
  Administered 2024-01-16: 250 mg via INTRAVENOUS
  Filled 2024-01-16: qty 20

## 2024-01-16 MED ORDER — DEXAMETHASONE SODIUM PHOSPHATE 10 MG/ML IJ SOLN
10.0000 mg | Freq: Once | INTRAMUSCULAR | Status: AC
  Administered 2024-01-16: 10 mg via INTRAVENOUS
  Filled 2024-01-16: qty 1

## 2024-01-16 MED ORDER — SODIUM CHLORIDE 0.9% FLUSH
10.0000 mL | Freq: Once | INTRAVENOUS | Status: AC
Start: 2024-01-16 — End: 2024-01-16
  Administered 2024-01-16: 10 mL

## 2024-01-16 MED ORDER — SODIUM CHLORIDE 0.9% FLUSH
10.0000 mL | INTRAVENOUS | Status: DC | PRN
  Administered 2024-01-16: 10 mL

## 2024-01-16 NOTE — Progress Notes (Signed)
 [facility]   Telephone:([phone]) [phone] Fax:([phone]   Clinic Follow up Note   Patient Care Team: [name], NP as PCP - General (Internal Medicine) Malachy Mood, MD as Consulting Physician (Hematology and Oncology)  Date of Service:  01/16/2024  CHIEF COMPLAINT: f/u of colon cancer  CURRENT THERAPY:  Adjuvant chemotherapy CapeOx  Oncology History   Cancer of sigmoid colon (HCC) pT4bN0M0, MMR normal, G2 -Patient presented with perforated sigmoid colon and a contained abscess required IR drainage tube placement. -He underwent left hemicolectomy and partial small bowel resection.  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  All surgical margins were negative. -Due to the high risk of recurrence given T4b lesion and bowel perforation, I recommend adjuvant chemotherapy FOLFOX or CAPOX for 6 months if he can tolerate. He opted CAPOX and started on 09/12/2023.   -Surveillance CT scan on January 09, 2024 was negative for recurrence.     Assessment and Plan    Colon cancer Undergoing adjuvant chemotherapy, currently on cycle seven with the next cycle in three weeks, expected to be the last. Reports mild neuropathy symptoms, such as numbness and tingling, without significant functional impairment. Blood counts are slightly low due to chemotherapy, which is expected. Elevated bilirubin levels present before chemotherapy. Recent CT scan shows no signs of recurrence, but there remains a high risk of recurrence. Post-chemotherapy, follow-up every three months with repeat scans every six months. Port to be flushed every six to eight weeks when not on chemotherapy, with consideration for removal after one to two years if scans remain clear. - Administer cycle seven of chemotherapy today. - Schedule the next chemotherapy cycle in three weeks. - Follow up every three months  post-chemotherapy. - Repeat CT scan every six months. - Perform tumor marker test and blood test to monitor disease. - Flush port every six to eight weeks when not on chemotherapy. - Consider port removal after one to two years if scans remain clear.  Neuropathy Mild neuropathy symptoms, including numbness and tingling, likely secondary to chemotherapy. No significant functional impairment, such as dropping objects or difficulty using hands. - Monitor neuropathy symptoms and assess for any progression or functional impairment.      Plan -Lab reviewed, adequate for treatment, will proceed to cycle 7 CapeOx today -Follow-up in 3 weeks before last cycle chemo   SUMMARY OF ONCOLOGIC HISTORY: Oncology History  Cancer of sigmoid colon (HCC)  05/21/2023 Imaging   CT ABDOMEN PELVIS W CONTRAST   IMPRESSION: 1. Sigmoid colon bowel wall thickening may reflect a colonic mass or focal diverticulitis. Multiple rim enhancing fluid collections in the abdomen or pelvis likely reflect abscesses and suggest bowel perforation due to the colonic mass/focal diverticulitis. 2. Multiple loops of dilated small bowel with a possible transition to decompressed small bowel in the lower right abdomen. This may reflect ileus or small-bowel obstruction and is likely related to the multiple abdominal abscesses/intraperitoneal inflammatory process.   06/03/2023 Imaging   CT ABDOMEN PELVIS W CONTRAST    IMPRESSION: 1. Interval placement of midline anterior and left transgluteal approach pelvic drains. The abscesses have resolved. 2. Masslike thickening of the sigmoid colon remains highly suspicious for primary neoplasm. 3. Soft tissue nodular implant in the right lower quadrant measuring 1.5 cm is suspicious for metastatic implant.     06/08/2023 Pathology Results   Diagnosis 1. Colon, polyp(s), transverse and ascending (3) - TUBULAR ADENOMA,  FRAGMENTS. 2. Colon, polyp(s), sigmoid and descending (2) -  TUBULOVILLOUS/TUBULAR ADENOMA, FRAGMENTS. 3. 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 NOTE: IT IS NOTED THAT THIS IS SUBMITTED AS A MASS. WHILE THERE IS FOCAL SPLAYING OF SMOOTH MUSCLE DEFINITIVE EVIDENCE OF INVASION INCLUDING DESMOPLASIA OR THE PRESENCE OF SUBMUCOSAL TISSUE IS NOT PRESENT IN THESE SUPERFICIAL BIOPSIES. CLINICAL/ENDOSCOPIC CORRELATION NECESSARY. 4. Rectum, polyp(s), (2) - TUBULOVILLOUS ADENOMA, FRAGMENTS.   06/30/2023 PET scan   NM PET IMAGE initial (PI) SKULL BASE TO THIGH  IMPRESSION: 1. Hypermetabolic mass in the proximal sigmoid colon consistent with primary colorectal carcinoma. 2. No evidence of metastatic adenopathy or peritoneal metastasis. No liver metastasis or distant metastasis   07/10/2023 Initial Diagnosis   Cancer of sigmoid colon [facility])    Imaging     08/15/2023 Cancer Staging   Staging form: Colon and Rectum, AJCC 8th Edition - Pathologic stage from 08/15/2023: Stage IIC (pT4b, pN0, cM0) - Signed by Malachy Mood, MD on 08/30/2023 Total positive nodes: 0 Histologic grading system: 4 grade system Histologic grade (G): G2 Residual tumor (R): R0 - None   09/12/2023 -  Chemotherapy   Patient is on Treatment Plan : COLORECTAL Xelox (Capeox)(130/850) q21d        Discussed the use of AI scribe software for clinical note transcription with the patient, who gave verbal consent to proceed.  History of Present Illness   The patient, a 63 year old with colon cancer, is currently undergoing adjuvant chemotherapy. He reports feeling well overall with no significant issues from the last cycle of chemotherapy. However, he does note mild neuropathy with numbness and tingling, but denies any functional impairment such as dropping items or difficulty using his hands. He reports good appetite and stable weight. He has a history of mildly elevated bilirubin levels, which were present even prior to the  initiation of chemotherapy.         All other systems were reviewed with the patient and are negative.  MEDICAL HISTORY:  Past Medical History:  Diagnosis Date   Colon cancer (HCC)    Diabetes mellitus without complication (HCC) type 2    GERD (gastroesophageal reflux disease)    Hypertension     SURGICAL HISTORY: Past Surgical History:  Procedure Laterality Date   COLONOSCOPY     FLEXIBLE SIGMOIDOSCOPY N/A 08/15/2023   Procedure: FLEXIBLE SIGMOIDOSCOPY;  Surgeon: Andria Meuse, MD;  Location: [facility];  Service: General;  Laterality: N/A;   IR RADIOLOGIST EVAL & MGMT  06/03/2023   LAPAROSCOPY N/A 08/15/2023   Procedure: LAPAROSCOPY DIAGNOSTIC;  Surgeon: Andria Meuse, MD;  Location: [facility];  Service: General;  Laterality: N/A;   PORTACATH PLACEMENT Right 09/09/2023   Procedure: INSERTION PORT-A-CATH ULTRASOUND FLUOROSCOPY;  Surgeon: Andria Meuse, MD;  Location: [facility];  Service: General;  Laterality: Right;   XI ROBOTIC ASSISTED LOWER ANTERIOR RESECTION N/A 08/15/2023   Procedure: XI ROBOTIC ASSISTED LOWER ANTERIOR RESECTION, SMALL BOWEL RESECTION, LYSIS OF ADHESIONS, AND INTRAOPERATIVE ASSESSMENT OF PERFUSION USING ICG DYE;  Surgeon: Andria Meuse, MD;  Location: [facility];  Service: General;  Laterality: N/A;    I have reviewed the social history and family history with the patient and they are unchanged from previous note.  ALLERGIES:  is allergic to codeine.  MEDICATIONS:  Current Outpatient Medications  Medication Sig Dispense Refill   amLODipine (NORVASC) 10 MG tablet Take 1 tablet by mouth once daily 90 tablet 0   capecitabine (XELODA) 500  MG tablet Take 3 tablets (1500 mg total) by mouth in morning and 4 tablets (2000 mg total) in evening. Take within 30 minutes after meals. Take for 14 days, then off for 7 days. Repeat every 21 days. 98 tablet 1   diclofenac Sodium (VOLTAREN) 1 % GEL Research Patient: Apply 0.5 grams (1  fingertip) to each hand and each foot twice daily for up to 12 weeks 400 g 0   lidocaine-prilocaine (EMLA) cream Apply to affected area once 30 g 3   metFORMIN (GLUCOPHAGE) 500 MG tablet TAKE 1 TABLET BY MOUTH TWICE DAILY WITH A MEAL 180 tablet 0   ondansetron (ZOFRAN) 8 MG tablet Take 1 tablet (8 mg total) by mouth every 8 (eight) hours as needed for nausea or vomiting. Start on the third day after chemotherapy. 30 tablet 1   pravastatin (PRAVACHOL) 20 MG tablet Take 1 tablet by mouth once daily 90 tablet 0   prochlorperazine (COMPAZINE) 10 MG tablet Take 1 tablet (10 mg total) by mouth every 6 (six) hours as needed for nausea or vomiting. 30 tablet 1   No current facility-administered medications for this visit.    PHYSICAL EXAMINATION: ECOG PERFORMANCE STATUS: 1 - Symptomatic but completely ambulatory  Vitals:   01/16/24 0918  BP: (!) 118/58  Pulse: 86  Resp: 20  Temp: 97.8 F (36.6 C)  SpO2: 99%   Wt Readings from Last 3 Encounters:  01/16/24 212 lb 3.2 oz (96.3 kg)  12/26/23 210 lb 9.6 oz (95.5 kg)  12/05/23 209 lb 6.4 oz (95 kg)     GENERAL:alert, no distress and comfortable SKIN: skin color, texture, turgor are normal, no rashes or significant lesions EYES: normal, Conjunctiva are pink and non-injected, sclera clear NECK: supple, thyroid normal size, non-tender, without nodularity LYMPH:  no palpable lymphadenopathy in the cervical, axillary  LUNGS: clear to auscultation and percussion with normal breathing effort HEART: regular rate & rhythm and no murmurs and no lower extremity edema ABDOMEN:abdomen soft, non-tender and normal bowel sounds Musculoskeletal:no cyanosis of digits and no clubbing  NEURO: alert & oriented x 3 with fluent speech, no focal motor/sensory deficits   LABORATORY DATA:  I have reviewed the data as listed    Latest Ref Rng & Units 01/16/2024    9:01 AM 12/26/2023    8:34 AM 12/05/2023    8:26 AM  CBC  WBC 4.0 - 10.5 K/uL 3.1  3.3  3.3    Hemoglobin 13.0 - 17.0 g/dL 21.3  08.6  57.8   Hematocrit 39.0 - 52.0 % 30.0  29.2  31.9   Platelets 150 - 400 K/uL 92  77  90         Latest Ref Rng & Units 01/16/2024    9:01 AM 12/26/2023    8:34 AM 12/05/2023    8:26 AM  CMP  Glucose 70 - 99 mg/dL 469  629  528   BUN 8 - 23 mg/dL 8  9  9    Creatinine 0.61 - 1.24 mg/dL 4.13  2.44  0.10   Sodium 135 - 145 mmol/L 137  140  137   Potassium 3.5 - 5.1 mmol/L 3.6  3.6  3.7   Chloride 98 - 111 mmol/L 107  109  107   CO2 22 - 32 mmol/L 26  27  25    Calcium 8.9 - 10.3 mg/dL 8.5  9.1  9.0   Total Protein 6.5 - 8.1 g/dL 6.8  6.8  6.8   Total Bilirubin 0.0 -  1.2 mg/dL 1.7  2.1  1.6   Alkaline Phos 38 - 126 U/L 79  69  68   AST 15 - 41 U/L 42  43  42   ALT 0 - 44 U/L 35  37  38       RADIOGRAPHIC STUDIES: I have personally reviewed the radiological images as listed and agreed with the findings in the report. No results found.    No orders of the defined types were placed in this encounter.  All questions were answered. The patient knows to call the clinic with any problems, questions or concerns. No barriers to learning was detected. The total time spent in the appointment was 25 minutes.     Malachy Mood, MD 01/16/2024

## 2024-01-16 NOTE — Patient Instructions (Signed)
 [facility] - A DEPT OF [facility]  Discharge Instructions: Thank you for choosing [facility] to provide your oncology and hematology care.   If you have a lab appointment with the Cancer Center, please go directly to the Cancer Center and check in at the registration area.   Wear comfortable clothing and clothing appropriate for easy access to any Portacath or PICC line.   We strive to give you quality time with your provider. You may need to reschedule your appointment if you arrive late (15 or more minutes).  Arriving late affects you and other patients whose appointments are after yours.  Also, if you miss three or more appointments without notifying the office, you may be dismissed from the clinic at the provider's discretion.      For prescription refill requests, have your pharmacy contact our office and allow 72 hours for refills to be completed.    Today you received the following chemotherapy and/or immunotherapy agent: Oxaliplatin      To help prevent nausea and vomiting after your treatment, we encourage you to take your nausea medication as directed.  BELOW ARE SYMPTOMS THAT SHOULD BE REPORTED IMMEDIATELY: *FEVER GREATER THAN 100.4 F (38 C) OR HIGHER *CHILLS OR SWEATING *NAUSEA AND VOMITING THAT IS NOT CONTROLLED WITH YOUR NAUSEA MEDICATION *UNUSUAL SHORTNESS OF BREATH *UNUSUAL BRUISING OR BLEEDING *URINARY PROBLEMS (pain or burning when urinating, or frequent urination) *BOWEL PROBLEMS (unusual diarrhea, constipation, pain near the anus) TENDERNESS IN MOUTH AND THROAT WITH OR WITHOUT PRESENCE OF ULCERS (sore throat, sores in mouth, or a toothache) UNUSUAL RASH, SWELLING OR PAIN  UNUSUAL VAGINAL DISCHARGE OR ITCHING   Items with * indicate a potential emergency and should be followed up as soon as possible or go to the Emergency Department if any problems should occur.  Please show the CHEMOTHERAPY ALERT CARD or IMMUNOTHERAPY  ALERT CARD at check-in to the Emergency Department and triage nurse.  Should you have questions after your visit or need to cancel or reschedule your appointment, please contact [facility] - A DEPT OF [facility]  Dept: [phone]  and follow the prompts.  Office hours are 8:00 a.m. to 4:30 p.m. Monday - Friday. Please note that voicemails left after 4:00 p.m. may not be returned until the following business day.  We are closed weekends and major holidays. You have access to a nurse at all times for urgent questions. Please call the main number to the clinic Dept: [phone] and follow the prompts.   For any non-urgent questions, you may also contact your provider using MyChart. We now offer e-Visits for anyone 66 and older to request care online for non-urgent symptoms. For details visit [email or web address].   Also download the MyChart app! Go to the app store, search "MyChart", open the app, select [facility], and log in with your MyChart username and password.  Oxaliplatin Injection What is this medication? OXALIPLATIN (ox AL i PLA tin) treats colorectal cancer. It works by slowing down the growth of cancer cells. This medicine may be used for other purposes; ask your health care provider or pharmacist if you have questions. COMMON BRAND NAME(S): Eloxatin What should I tell my care team before I take this medication? They need to know if you have any of these conditions: Heart disease History of irregular heartbeat or rhythm Liver disease Low blood cell levels (white cells, red cells, and platelets) Lung  or breathing disease, such as asthma Take medications that treat or prevent blood clots Tingling of the fingers, toes, or other nerve disorder An unusual or allergic reaction to oxaliplatin, other medications, foods, dyes, or preservatives If you or your partner are pregnant or trying to get pregnant Breast-feeding How should I use this  medication? This medication is injected into a vein. It is given by your care team in a hospital or clinic setting. Talk to your care team about the use of this medication in children. Special care may be needed. Overdosage: If you think you have taken too much of this medicine contact a poison control center or emergency room at once. NOTE: This medicine is only for you. Do not share this medicine with others. What if I miss a dose? Keep appointments for follow-up doses. It is important not to miss a dose. Call your care team if you are unable to keep an appointment. What may interact with this medication? Do not take this medication with any of the following: Cisapride Dronedarone Pimozide Thioridazine This medication may also interact with the following: Aspirin and aspirin-like medications Certain medications that treat or prevent blood clots, such as warfarin, apixaban, dabigatran, and rivaroxaban Cisplatin Cyclosporine Diuretics Medications for infection, such as acyclovir, adefovir, amphotericin B, bacitracin, cidofovir, foscarnet, ganciclovir, gentamicin, pentamidine, vancomycin NSAIDs, medications for pain and inflammation, such as ibuprofen or naproxen Other medications that cause heart rhythm changes Pamidronate Zoledronic acid This list may not describe all possible interactions. Give your health care provider a list of all the medicines, herbs, non-prescription drugs, or dietary supplements you use. Also tell them if you smoke, drink alcohol, or use illegal drugs. Some items may interact with your medicine. What should I watch for while using this medication? Your condition will be monitored carefully while you are receiving this medication. You may need blood work while taking this medication. This medication may make you feel generally unwell. This is not uncommon as chemotherapy can affect healthy cells as well as cancer cells. Report any side effects. Continue your course  of treatment even though you feel ill unless your care team tells you to stop. This medication may increase your risk of getting an infection. Call your care team for advice if you get a fever, chills, sore throat, or other symptoms of a cold or flu. Do not treat yourself. Try to avoid being around people who are sick. Avoid taking medications that contain aspirin, acetaminophen, ibuprofen, naproxen, or ketoprofen unless instructed by your care team. These medications may hide a fever. Be careful brushing or flossing your teeth or using a toothpick because you may get an infection or bleed more easily. If you have any dental work done, tell your dentist you are receiving this medication. This medication can make you more sensitive to cold. Do not drink cold drinks or use ice. Cover exposed skin before coming in contact with cold temperatures or cold objects. When out in cold weather wear warm clothing and cover your mouth and nose to warm the air that goes into your lungs. Tell your care team if you get sensitive to the cold. Talk to your care team if you or your partner are pregnant or think either of you might be pregnant. This medication can cause serious birth defects if taken during pregnancy and for 9 months after the last dose. A negative pregnancy test is required before starting this medication. A reliable form of contraception is recommended while taking this medication and for  9 months after the last dose. Talk to your care team about effective forms of contraception. Do not father a child while taking this medication and for 6 months after the last dose. Use a condom while having sex during this time period. Do not breastfeed while taking this medication and for 3 months after the last dose. This medication may cause infertility. Talk to your care team if you are concerned about your fertility. What side effects may I notice from receiving this medication? Side effects that you should report to  your care team as soon as possible: Allergic reactions--skin rash, itching, hives, swelling of the face, lips, tongue, or throat Bleeding--bloody or black, tar-like stools, vomiting blood or brown material that looks like coffee grounds, red or dark brown urine, small red or purple spots on skin, unusual bruising or bleeding Dry cough, shortness of breath or trouble breathing Heart rhythm changes--fast or irregular heartbeat, dizziness, feeling faint or lightheaded, chest pain, trouble breathing Infection--fever, chills, cough, sore throat, wounds that don't heal, pain or trouble when passing urine, general feeling of discomfort or being unwell Liver injury--right upper belly pain, loss of appetite, nausea, light-colored stool, dark yellow or brown urine, yellowing skin or eyes, unusual weakness or fatigue Low red blood cell level--unusual weakness or fatigue, dizziness, headache, trouble breathing Muscle injury--unusual weakness or fatigue, muscle pain, dark yellow or brown urine, decrease in amount of urine Pain, tingling, or numbness in the hands or feet Sudden and severe headache, confusion, change in vision, seizures, which may be signs of posterior reversible encephalopathy syndrome (PRES) Unusual bruising or bleeding Side effects that usually do not require medical attention (report to your care team if they continue or are bothersome): Diarrhea Nausea Pain, redness, or swelling with sores inside the mouth or throat Unusual weakness or fatigue Vomiting This list may not describe all possible side effects. Call your doctor for medical advice about side effects. You may report side effects to FDA at 1-800-FDA-1088. Where should I keep my medication? This medication is given in a hospital or clinic. It will not be stored at home. NOTE: This sheet is a summary. It may not cover all possible information. If you have questions about this medicine, talk to your doctor, pharmacist, or health care  provider.  2024 Elsevier/Gold Standard (2023-09-30 00:00:00)

## 2024-01-30 ENCOUNTER — Other Ambulatory Visit: Payer: Self-pay

## 2024-01-30 ENCOUNTER — Other Ambulatory Visit: Payer: Self-pay | Admitting: Nurse Practitioner

## 2024-01-30 ENCOUNTER — Other Ambulatory Visit: Payer: Self-pay | Admitting: Pharmacy Technician

## 2024-01-30 DIAGNOSIS — C187 Malignant neoplasm of sigmoid colon: Secondary | ICD-10-CM

## 2024-01-30 MED ORDER — CAPECITABINE 500 MG PO TABS
ORAL_TABLET | ORAL | 1 refills | Status: DC
  Filled 2024-01-30: qty 98, 21d supply, fill #0

## 2024-01-30 NOTE — Progress Notes (Signed)
 Specialty Pharmacy Refill Coordination Note  Duane [name] is a 63 y.o. male contacted today regarding refills of specialty medication(s) Capecitabine (XELODA)   Patient requested Delivery   Delivery date: 02/02/24   Verified address: [address] [name], [place]   Medication will be filled on 02/01/24.  RR sent to MD

## 2024-02-05 ENCOUNTER — Other Ambulatory Visit: Payer: Self-pay | Admitting: Nurse Practitioner

## 2024-02-05 DIAGNOSIS — C187 Malignant neoplasm of sigmoid colon: Secondary | ICD-10-CM

## 2024-02-05 NOTE — Progress Notes (Unsigned)
 Patient Care Team: [name], NP as PCP - General (Internal Medicine) Malachy Mood, MD as Consulting Physician (Hematology and Oncology)  Clinic Day:  02/06/2024  Referring physician: [name], NP  ASSESSMENT & PLAN:   Assessment & Plan: Cancer of sigmoid colon (HCC) pT4bN0M0, MMR normal, G2 -Patient presented with perforated sigmoid colon and a contained abscess required IR drainage tube placement. -He underwent left hemicolectomy and partial small bowel resection.  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  All surgical margins were negative. -Due to the high risk of recurrence given T4b lesion and bowel perforation, I recommend adjuvant chemotherapy FOLFOX or CAPOX for 6 months if he can tolerate. He opted CAPOX and started on 09/12/2023.   -Surveillance CT scan on January 09, 2024 was negative for recurrence. -02/06/2024 - today is to be last treatment with chemotherapy.  Will finish current cycle of capecitabine.  Continue with flush appointments every 6 weeks.  Follow-up with labs, including CEA, every 3 months.  Repeat scan every 6 months.  Patient is agreeable to this plan.   Plan:  Labs reviewed. -Stable and mild anemia. -Improving liver functions and T. bili. - CEA slightly elevated at 6.25, though improved from check 1 month prior. Proceed with final dose oxaliplatin.  Complete current cycle capecitabine. Continue with port flush every 6 to 8 weeks. Labs with port flush and follow-up in 3 months. -Monitor CEA tumor marker with lab visits. Repeat CT CAP in 6 months (07/2024).   The patient understands the plans discussed today and is in agreement with them.  He knows to contact our office if he develops concerns prior to his next appointment.  I provided 25 minutes of face-to-face time during this encounter and > 50% was spent counseling as documented under my assessment and  plan.    Carlean Jews, NP  [facility] - A DEPT OF [facility] [address] [place] [address] Dept: [phone] Dept Fax: [phone]   No orders of the defined types were placed in this encounter.     CHIEF COMPLAINT:  CC: f/u colon cancer  Current Treatment:  adjuvant chemotherapy CapeOx.  INTERVAL HISTORY:  Duane [name] is here today for repeat clinical assessment. He was last seen 01/16/2024. Negative restaging CT on 01/09/2024. Today, 02/06/2024 - is to be last cycle chemotherapy.  He reports baseline neuropathy in his feet.  He also had some swelling in his lower legs and feet.  Started wearing compression socks and swelling has resolved.  He also has some cold sensitivity especially with cold liquids and cold foods.  This usually resolves after 1 to 2 weeks. He denies chest pain, chest pressure, or shortness of breath. He denies headaches or visual disturbances. He denies abdominal pain, nausea, vomiting, or changes in bowel or bladder habits.   He denies fevers or chills. He denies pain. His appetite is good. His weight has been stable.  I have reviewed the past medical history, past surgical history, social history and family history with the patient and they are unchanged from previous note.  ALLERGIES:  is allergic to codeine.  MEDICATIONS:  Current Outpatient Medications  Medication Sig Dispense Refill   amLODipine (NORVASC) 10 MG tablet Take 1 tablet by mouth once daily 90 tablet 0   capecitabine (XELODA) 500 MG tablet Take 3 tablets (1500 mg total) by  mouth in morning and 4 tablets (2000 mg total) in evening. Take within 30 minutes after meals. Take for 14 days, then off for 7 days. Repeat every 21 days. 98 tablet 1   diclofenac Sodium (VOLTAREN) 1 % GEL Research Patient: Apply 0.5 grams (1 fingertip) to each hand and each foot twice daily for up to 12 weeks 400 g 0   lidocaine-prilocaine (EMLA) cream  Apply to affected area once 30 g 3   metFORMIN (GLUCOPHAGE) 500 MG tablet TAKE 1 TABLET BY MOUTH TWICE DAILY WITH A MEAL 180 tablet 0   ondansetron (ZOFRAN) 8 MG tablet Take 1 tablet (8 mg total) by mouth every 8 (eight) hours as needed for nausea or vomiting. Start on the third day after chemotherapy. 30 tablet 1   pravastatin (PRAVACHOL) 20 MG tablet Take 1 tablet by mouth once daily 90 tablet 0   prochlorperazine (COMPAZINE) 10 MG tablet Take 1 tablet (10 mg total) by mouth every 6 (six) hours as needed for nausea or vomiting. 30 tablet 1   No current facility-administered medications for this visit.   Facility-Administered Medications Ordered in Other Visits  Medication Dose Route Frequency Provider Last Rate Last Admin   dextrose 5 % solution   Intravenous Continuous Malachy Mood, MD   Stopped at 02/06/24 1350   sodium chloride flush (NS) 0.9 % injection 10 mL  10 mL Intracatheter PRN Malachy Mood, MD   10 mL at 02/06/24 1355    HISTORY OF PRESENT ILLNESS:   Oncology History  Cancer of sigmoid colon (HCC)  05/21/2023 Imaging   CT ABDOMEN PELVIS W CONTRAST   IMPRESSION: 1. Sigmoid colon bowel wall thickening may reflect a colonic mass or focal diverticulitis. Multiple rim enhancing fluid collections in the abdomen or pelvis likely reflect abscesses and suggest bowel perforation due to the colonic mass/focal diverticulitis. 2. Multiple loops of dilated small bowel with a possible transition to decompressed small bowel in the lower right abdomen. This may reflect ileus or small-bowel obstruction and is likely related to the multiple abdominal abscesses/intraperitoneal inflammatory process.   06/03/2023 Imaging   CT ABDOMEN PELVIS W CONTRAST    IMPRESSION: 1. Interval placement of midline anterior and left transgluteal approach pelvic drains. The abscesses have resolved. 2. Masslike thickening of the sigmoid colon remains highly suspicious for primary neoplasm. 3. Soft tissue  nodular implant in the right lower quadrant measuring 1.5 cm is suspicious for metastatic implant.     06/08/2023 Pathology Results   Diagnosis 1. Colon, polyp(s), transverse and ascending (3) - TUBULAR ADENOMA, FRAGMENTS. 2. Colon, polyp(s), sigmoid and descending (2) - TUBULOVILLOUS/TUBULAR ADENOMA, FRAGMENTS. 3. 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 NOTE: IT IS NOTED THAT THIS IS SUBMITTED AS A MASS. WHILE THERE IS FOCAL SPLAYING OF SMOOTH MUSCLE DEFINITIVE EVIDENCE OF INVASION INCLUDING DESMOPLASIA OR THE PRESENCE OF SUBMUCOSAL TISSUE IS NOT PRESENT IN THESE SUPERFICIAL BIOPSIES. CLINICAL/ENDOSCOPIC CORRELATION NECESSARY. 4. Rectum, polyp(s), (2) - TUBULOVILLOUS ADENOMA, FRAGMENTS.   06/30/2023 PET scan   NM PET IMAGE initial (PI) SKULL BASE TO THIGH  IMPRESSION: 1. Hypermetabolic mass in the proximal sigmoid colon consistent with primary colorectal carcinoma. 2. No evidence of metastatic adenopathy or peritoneal metastasis. No liver metastasis or distant metastasis   07/10/2023 Initial Diagnosis   Cancer of sigmoid colon [facility])    Imaging     08/15/2023 Cancer Staging   Staging form: Colon and Rectum, AJCC 8th Edition - Pathologic stage from 08/15/2023: Stage IIC (pT4b,  pN0, cM0) - Signed by Malachy Mood, MD on 08/30/2023 Total positive nodes: 0 Histologic grading system: 4 grade system Histologic grade (G): G2 Residual tumor (R): R0 - None   09/12/2023 -  Chemotherapy   Patient is on Treatment Plan : COLORECTAL Xelox (Capeox)(130/850) q21d         REVIEW OF SYSTEMS:   Constitutional: Denies fevers, chills or abnormal weight loss Eyes: Denies blurriness of vision Ears, nose, mouth, throat, and face: Denies mucositis or sore throat Respiratory: Denies cough, dyspnea or wheezes Cardiovascular: Denies palpitation, chest discomfort or lower extremity swelling Gastrointestinal:  Denies nausea, heartburn or  change in bowel habits Skin: Denies abnormal skin rashes Lymphatics: Denies new lymphadenopathy or easy bruising Neurological:Denies numbness, tingling or new weaknesses Behavioral/Psych: Mood is stable, no new changes  All other systems were reviewed with the patient and are negative.   VITALS:   Today's Vitals   02/06/24 0929  BP: 120/62  Pulse: 78  Resp: 18  Temp: 98.6 F (37 C)  TempSrc: Temporal  SpO2: 99%  Weight: 211 lb 11.2 oz (96 kg)  Height: 6\' 2"  (1.88 m)  PainSc: 0-No pain   Body mass index is 27.18 kg/m.   Wt Readings from Last 3 Encounters:  02/06/24 211 lb 11.2 oz (96 kg)  01/16/24 212 lb 3.2 oz (96.3 kg)  12/26/23 210 lb 9.6 oz (95.5 kg)    Body mass index is 27.18 kg/m.  Performance status (ECOG): 1 - Symptomatic but completely ambulatory  PHYSICAL EXAM:   GENERAL:alert, no distress and comfortable SKIN: skin color, texture, turgor are normal, no rashes or significant lesions EYES: normal, Conjunctiva are pink and non-injected, sclera clear OROPHARYNX:no exudate, no erythema and lips, buccal mucosa, and tongue normal  NECK: supple, thyroid normal size, non-tender, without nodularity LYMPH:  no palpable lymphadenopathy in the cervical, axillary or inguinal LUNGS: clear to auscultation and percussion with normal breathing effort HEART: regular rate & rhythm and no murmurs and no lower extremity edema ABDOMEN:abdomen soft, non-tender and normal bowel sounds Musculoskeletal:no cyanosis of digits and no clubbing  NEURO: alert & oriented x 3 with fluent speech, no focal motor/sensory deficits  LABORATORY DATA:  I have reviewed the data as listed    Component Value Date/Time   NA 139 02/06/2024 0854   NA 141 06/13/2023 1440   K 3.6 02/06/2024 0854   CL 108 02/06/2024 0854   CO2 26 02/06/2024 0854   GLUCOSE 185 (H) 02/06/2024 0854   BUN 9 02/06/2024 0854   BUN 6 (L) 06/13/2023 1440   CREATININE 0.93 02/06/2024 0854   CALCIUM 9.3 02/06/2024 0854    PROT 7.2 02/06/2024 0854   PROT 6.8 06/13/2023 1440   ALBUMIN 3.7 02/06/2024 0854   ALBUMIN 3.8 (L) 06/13/2023 1440   AST 46 (H) 02/06/2024 0854   ALT 35 02/06/2024 0854   ALKPHOS 76 02/06/2024 0854   BILITOT 1.9 (H) 02/06/2024 0854   GFRNONAA >60 02/06/2024 0854    Lab Results  Component Value Date   WBC 3.6 (L) 02/06/2024   NEUTROABS 1.9 02/06/2024   HGB 11.1 (L) 02/06/2024   HCT 31.5 (L) 02/06/2024   MCV 98.1 02/06/2024   PLT 92 (L) 02/06/2024    RADIOGRAPHIC STUDIES: CT CHEST ABDOMEN PELVIS W CONTRAST Result Date: 01/11/2024 CLINICAL DATA:  Colon cancer, assess treatment response. EXAM: CT CHEST, ABDOMEN, AND PELVIS WITH CONTRAST TECHNIQUE: Multidetector CT imaging of the chest, abdomen and pelvis was performed following the standard protocol during bolus administration of  intravenous contrast. RADIATION DOSE REDUCTION: This exam was performed according to the departmental dose-optimization program which includes automated exposure control, adjustment of the mA and/or kV according to patient size and/or use of iterative reconstruction technique. CONTRAST:  OMNIPAQUE IOHEXOL 300 MG/ML  SOLN COMPARISON:  PET-CT from 06/30/2023. FINDINGS: CHEST: Lungs/pleura: No worrisome nodule is identified. No pleural effusion. No evidence of pneumonia. Patent central airways. Mediastinum: No enlarged lymph nodes. Thoracic aorta normal in caliber. Heart size normal. No significant pericardial effusion. ABDOMEN/PELVIS: Liver/gallbladder: Normal hepatic morphology. No suspicious hepatic lesion. Gallbladder unremarkable. Adrenal: No adrenal nodule. Renal: No hydronephrosis. No suspicious renal lesion. Spleen: Normal in size. Pancreas: Unremarkable without surrounding infiltration or fluid. Vascular: Aorta and common iliac arteries normal in caliber. Urinary bladder: Mostly decompressed. GI: Postop change from partial distal colonic resection. No suspicious finding at the anastomosis. No colonic wall  thickening of reading:. No dilated small bowel loops. Appendix normal. Other: No drainable fluid collection. No free air. SKELETON: No suspicious osseous lesion. No acute fracture identified. IMPRESSION: *No CT evidence of local tumor recurrence or metastatic disease. * Please see above for more details. Electronically Signed   By: Ala Bent M.D.   On: 01/11/2024 20:00

## 2024-02-05 NOTE — Assessment & Plan Note (Signed)
 pT4bN0M0, MMR normal, G2 -Patient presented with perforated sigmoid colon and a contained abscess required IR drainage tube placement. -He underwent left hemicolectomy and partial small bowel resection.  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  All surgical margins were negative. -Due to the high risk of recurrence given T4b lesion and bowel perforation, I recommend adjuvant chemotherapy FOLFOX or CAPOX for 6 months if he can tolerate. He opted CAPOX and started on 09/12/2023.   -Surveillance CT scan on January 09, 2024 was negative for recurrence. -02/06/2024 - today is to be last treatment with chemotherapy.  Will finish current cycle of capecitabine.  Continue with flush appointments every 6 weeks.  Follow-up with labs, including CEA, every 3 months.  Repeat scan every 6 months.  Patient is agreeable to this plan.

## 2024-02-06 ENCOUNTER — Inpatient Hospital Stay: Payer: Commercial Managed Care - HMO | Attending: Hematology

## 2024-02-06 ENCOUNTER — Inpatient Hospital Stay: Payer: Commercial Managed Care - HMO

## 2024-02-06 ENCOUNTER — Inpatient Hospital Stay (HOSPITAL_BASED_OUTPATIENT_CLINIC_OR_DEPARTMENT_OTHER): Payer: Commercial Managed Care - HMO | Attending: Hematology | Admitting: Nurse Practitioner

## 2024-02-06 ENCOUNTER — Encounter: Payer: Self-pay | Admitting: Nurse Practitioner

## 2024-02-06 VITALS — BP 123/78 | HR 67 | Temp 97.6°F | Resp 18

## 2024-02-06 VITALS — BP 120/62 | HR 78 | Temp 98.6°F | Resp 18 | Ht 74.0 in | Wt 211.7 lb

## 2024-02-06 DIAGNOSIS — Z79899 Other long term (current) drug therapy: Secondary | ICD-10-CM | POA: Insufficient documentation

## 2024-02-06 DIAGNOSIS — C187 Malignant neoplasm of sigmoid colon: Secondary | ICD-10-CM | POA: Diagnosis present

## 2024-02-06 DIAGNOSIS — D500 Iron deficiency anemia secondary to blood loss (chronic): Secondary | ICD-10-CM

## 2024-02-06 DIAGNOSIS — Z5111 Encounter for antineoplastic chemotherapy: Secondary | ICD-10-CM | POA: Diagnosis present

## 2024-02-06 DIAGNOSIS — Z95828 Presence of other vascular implants and grafts: Secondary | ICD-10-CM

## 2024-02-06 LAB — CBC WITH DIFFERENTIAL (CANCER CENTER ONLY)
Abs Immature Granulocytes: 0.01 10*3/uL (ref 0.00–0.07)
Basophils Absolute: 0 10*3/uL (ref 0.0–0.1)
Basophils Relative: 0 %
Eosinophils Absolute: 0 10*3/uL (ref 0.0–0.5)
Eosinophils Relative: 1 %
HCT: 31.5 % — ABNORMAL LOW (ref 39.0–52.0)
Hemoglobin: 11.1 g/dL — ABNORMAL LOW (ref 13.0–17.0)
Immature Granulocytes: 0 %
Lymphocytes Relative: 32 %
Lymphs Abs: 1.2 10*3/uL (ref 0.7–4.0)
MCH: 34.6 pg — ABNORMAL HIGH (ref 26.0–34.0)
MCHC: 35.2 g/dL (ref 30.0–36.0)
MCV: 98.1 fL (ref 80.0–100.0)
Monocytes Absolute: 0.5 10*3/uL (ref 0.1–1.0)
Monocytes Relative: 13 %
Neutro Abs: 1.9 10*3/uL (ref 1.7–7.7)
Neutrophils Relative %: 54 %
Platelet Count: 92 10*3/uL — ABNORMAL LOW (ref 150–400)
RBC: 3.21 MIL/uL — ABNORMAL LOW (ref 4.22–5.81)
RDW: 17.4 % — ABNORMAL HIGH (ref 11.5–15.5)
WBC Count: 3.6 10*3/uL — ABNORMAL LOW (ref 4.0–10.5)
nRBC: 0 % (ref 0.0–0.2)

## 2024-02-06 LAB — CMP (CANCER CENTER ONLY)
ALT: 35 U/L (ref 0–44)
AST: 46 U/L — ABNORMAL HIGH (ref 15–41)
Albumin: 3.7 g/dL (ref 3.5–5.0)
Alkaline Phosphatase: 76 U/L (ref 38–126)
Anion gap: 5 (ref 5–15)
BUN: 9 mg/dL (ref 8–23)
CO2: 26 mmol/L (ref 22–32)
Calcium: 9.3 mg/dL (ref 8.9–10.3)
Chloride: 108 mmol/L (ref 98–111)
Creatinine: 0.93 mg/dL (ref 0.61–1.24)
GFR, Estimated: >60 mL/min (ref >60)
Glucose, Bld: 185 mg/dL — ABNORMAL HIGH (ref 70–99)
Potassium: 3.6 mmol/L (ref 3.5–5.1)
Sodium: 139 mmol/L (ref 135–145)
Total Bilirubin: 1.9 mg/dL — ABNORMAL HIGH (ref 0.0–1.2)
Total Protein: 7.2 g/dL (ref 6.5–8.1)

## 2024-02-06 LAB — CEA (ACCESS): CEA (CHCC): 6.25 ng/mL — ABNORMAL HIGH (ref 0.00–5.00)

## 2024-02-06 LAB — FERRITIN: Ferritin: 88 ng/mL (ref 24–336)

## 2024-02-06 MED ORDER — DEXTROSE 5 % IV SOLN: INTRAVENOUS | Status: DC

## 2024-02-06 MED ORDER — SODIUM CHLORIDE 0.9% FLUSH
10.0000 mL | INTRAVENOUS | Status: DC | PRN
  Administered 2024-02-06: 10 mL

## 2024-02-06 MED ORDER — HEPARIN SOD (PORK) LOCK FLUSH 100 UNIT/ML IV SOLN
500.0000 [IU] | Freq: Once | INTRAVENOUS | Status: AC | PRN
  Administered 2024-02-06: 500 [IU]

## 2024-02-06 MED ORDER — PALONOSETRON HCL INJECTION 0.25 MG/5ML
0.2500 mg | Freq: Once | INTRAVENOUS | Status: AC
  Administered 2024-02-06: 0.25 mg via INTRAVENOUS
  Filled 2024-02-06: qty 5

## 2024-02-06 MED ORDER — SODIUM CHLORIDE 0.9% FLUSH
10.0000 mL | Freq: Once | INTRAVENOUS | Status: AC
  Administered 2024-02-06: 10 mL

## 2024-02-06 MED ORDER — OXALIPLATIN CHEMO INJECTION 100 MG/20ML
110.0000 mg/m2 | Freq: Once | INTRAVENOUS | Status: AC
  Administered 2024-02-06: 250 mg via INTRAVENOUS
  Filled 2024-02-06: qty 10

## 2024-02-06 MED ORDER — DEXAMETHASONE SODIUM PHOSPHATE 10 MG/ML IJ SOLN
10.0000 mg | Freq: Once | INTRAMUSCULAR | Status: AC
  Administered 2024-02-06: 10 mg via INTRAVENOUS
  Filled 2024-02-06: qty 1

## 2024-02-06 NOTE — Patient Instructions (Signed)
 [facility] - A DEPT OF [facility]  Discharge Instructions: Thank you for choosing [facility] to provide your oncology and hematology care.   If you have a lab appointment with the Cancer Center, please go directly to the Cancer Center and check in at the registration area.   Wear comfortable clothing and clothing appropriate for easy access to any Portacath or PICC line.   We strive to give you quality time with your provider. You may need to reschedule your appointment if you arrive late (15 or more minutes).  Arriving late affects you and other patients whose appointments are after yours.  Also, if you miss three or more appointments without notifying the office, you may be dismissed from the clinic at the provider's discretion.      For prescription refill requests, have your pharmacy contact our office and allow 72 hours for refills to be completed.    Today you received the following chemotherapy and/or immunotherapy agent: Oxaliplatin      To help prevent nausea and vomiting after your treatment, we encourage you to take your nausea medication as directed.  BELOW ARE SYMPTOMS THAT SHOULD BE REPORTED IMMEDIATELY: *FEVER GREATER THAN 100.4 F (38 C) OR HIGHER *CHILLS OR SWEATING *NAUSEA AND VOMITING THAT IS NOT CONTROLLED WITH YOUR NAUSEA MEDICATION *UNUSUAL SHORTNESS OF BREATH *UNUSUAL BRUISING OR BLEEDING *URINARY PROBLEMS (pain or burning when urinating, or frequent urination) *BOWEL PROBLEMS (unusual diarrhea, constipation, pain near the anus) TENDERNESS IN MOUTH AND THROAT WITH OR WITHOUT PRESENCE OF ULCERS (sore throat, sores in mouth, or a toothache) UNUSUAL RASH, SWELLING OR PAIN  UNUSUAL VAGINAL DISCHARGE OR ITCHING   Items with * indicate a potential emergency and should be followed up as soon as possible or go to the Emergency Department if any problems should occur.  Please show the CHEMOTHERAPY ALERT CARD or IMMUNOTHERAPY  ALERT CARD at check-in to the Emergency Department and triage nurse.  Should you have questions after your visit or need to cancel or reschedule your appointment, please contact [facility] - A DEPT OF [facility]  Dept: [phone]  and follow the prompts.  Office hours are 8:00 a.m. to 4:30 p.m. Monday - Friday. Please note that voicemails left after 4:00 p.m. may not be returned until the following business day.  We are closed weekends and major holidays. You have access to a nurse at all times for urgent questions. Please call the main number to the clinic Dept: [phone] and follow the prompts.   For any non-urgent questions, you may also contact your provider using MyChart. We now offer e-Visits for anyone 66 and older to request care online for non-urgent symptoms. For details visit [email or web address].   Also download the MyChart app! Go to the app store, search "MyChart", open the app, select [facility], and log in with your MyChart username and password.  Oxaliplatin Injection What is this medication? OXALIPLATIN (ox AL i PLA tin) treats colorectal cancer. It works by slowing down the growth of cancer cells. This medicine may be used for other purposes; ask your health care provider or pharmacist if you have questions. COMMON BRAND NAME(S): Eloxatin What should I tell my care team before I take this medication? They need to know if you have any of these conditions: Heart disease History of irregular heartbeat or rhythm Liver disease Low blood cell levels (white cells, red cells, and platelets) Lung  or breathing disease, such as asthma Take medications that treat or prevent blood clots Tingling of the fingers, toes, or other nerve disorder An unusual or allergic reaction to oxaliplatin, other medications, foods, dyes, or preservatives If you or your partner are pregnant or trying to get pregnant Breast-feeding How should I use this  medication? This medication is injected into a vein. It is given by your care team in a hospital or clinic setting. Talk to your care team about the use of this medication in children. Special care may be needed. Overdosage: If you think you have taken too much of this medicine contact a poison control center or emergency room at once. NOTE: This medicine is only for you. Do not share this medicine with others. What if I miss a dose? Keep appointments for follow-up doses. It is important not to miss a dose. Call your care team if you are unable to keep an appointment. What may interact with this medication? Do not take this medication with any of the following: Cisapride Dronedarone Pimozide Thioridazine This medication may also interact with the following: Aspirin and aspirin-like medications Certain medications that treat or prevent blood clots, such as warfarin, apixaban, dabigatran, and rivaroxaban Cisplatin Cyclosporine Diuretics Medications for infection, such as acyclovir, adefovir, amphotericin B, bacitracin, cidofovir, foscarnet, ganciclovir, gentamicin, pentamidine, vancomycin NSAIDs, medications for pain and inflammation, such as ibuprofen or naproxen Other medications that cause heart rhythm changes Pamidronate Zoledronic acid This list may not describe all possible interactions. Give your health care provider a list of all the medicines, herbs, non-prescription drugs, or dietary supplements you use. Also tell them if you smoke, drink alcohol, or use illegal drugs. Some items may interact with your medicine. What should I watch for while using this medication? Your condition will be monitored carefully while you are receiving this medication. You may need blood work while taking this medication. This medication may make you feel generally unwell. This is not uncommon as chemotherapy can affect healthy cells as well as cancer cells. Report any side effects. Continue your course  of treatment even though you feel ill unless your care team tells you to stop. This medication may increase your risk of getting an infection. Call your care team for advice if you get a fever, chills, sore throat, or other symptoms of a cold or flu. Do not treat yourself. Try to avoid being around people who are sick. Avoid taking medications that contain aspirin, acetaminophen, ibuprofen, naproxen, or ketoprofen unless instructed by your care team. These medications may hide a fever. Be careful brushing or flossing your teeth or using a toothpick because you may get an infection or bleed more easily. If you have any dental work done, tell your dentist you are receiving this medication. This medication can make you more sensitive to cold. Do not drink cold drinks or use ice. Cover exposed skin before coming in contact with cold temperatures or cold objects. When out in cold weather wear warm clothing and cover your mouth and nose to warm the air that goes into your lungs. Tell your care team if you get sensitive to the cold. Talk to your care team if you or your partner are pregnant or think either of you might be pregnant. This medication can cause serious birth defects if taken during pregnancy and for 9 months after the last dose. A negative pregnancy test is required before starting this medication. A reliable form of contraception is recommended while taking this medication and for  9 months after the last dose. Talk to your care team about effective forms of contraception. Do not father a child while taking this medication and for 6 months after the last dose. Use a condom while having sex during this time period. Do not breastfeed while taking this medication and for 3 months after the last dose. This medication may cause infertility. Talk to your care team if you are concerned about your fertility. What side effects may I notice from receiving this medication? Side effects that you should report to  your care team as soon as possible: Allergic reactions--skin rash, itching, hives, swelling of the face, lips, tongue, or throat Bleeding--bloody or black, tar-like stools, vomiting blood or brown material that looks like coffee grounds, red or dark brown urine, small red or purple spots on skin, unusual bruising or bleeding Dry cough, shortness of breath or trouble breathing Heart rhythm changes--fast or irregular heartbeat, dizziness, feeling faint or lightheaded, chest pain, trouble breathing Infection--fever, chills, cough, sore throat, wounds that don't heal, pain or trouble when passing urine, general feeling of discomfort or being unwell Liver injury--right upper belly pain, loss of appetite, nausea, light-colored stool, dark yellow or brown urine, yellowing skin or eyes, unusual weakness or fatigue Low red blood cell level--unusual weakness or fatigue, dizziness, headache, trouble breathing Muscle injury--unusual weakness or fatigue, muscle pain, dark yellow or brown urine, decrease in amount of urine Pain, tingling, or numbness in the hands or feet Sudden and severe headache, confusion, change in vision, seizures, which may be signs of posterior reversible encephalopathy syndrome (PRES) Unusual bruising or bleeding Side effects that usually do not require medical attention (report to your care team if they continue or are bothersome): Diarrhea Nausea Pain, redness, or swelling with sores inside the mouth or throat Unusual weakness or fatigue Vomiting This list may not describe all possible side effects. Call your doctor for medical advice about side effects. You may report side effects to FDA at 1-800-FDA-1088. Where should I keep my medication? This medication is given in a hospital or clinic. It will not be stored at home. NOTE: This sheet is a summary. It may not cover all possible information. If you have questions about this medicine, talk to your doctor, pharmacist, or health care  provider.  2024 Elsevier/Gold Standard (2023-09-30 00:00:00)

## 2024-02-07 ENCOUNTER — Other Ambulatory Visit: Payer: Self-pay

## 2024-02-17 ENCOUNTER — Other Ambulatory Visit: Payer: Self-pay

## 2024-02-17 NOTE — Progress Notes (Signed)
 Disenrolling patient, Therapy completed.

## 2024-02-21 ENCOUNTER — Other Ambulatory Visit: Payer: Self-pay

## 2024-03-19 ENCOUNTER — Telehealth: Payer: Self-pay | Admitting: Nurse Practitioner

## 2024-03-19 ENCOUNTER — Other Ambulatory Visit: Payer: Self-pay | Admitting: Nurse Practitioner

## 2024-03-19 ENCOUNTER — Inpatient Hospital Stay: Attending: Hematology

## 2024-03-19 VITALS — BP 152/80 | HR 75 | Temp 98.2°F | Resp 17

## 2024-03-19 DIAGNOSIS — C187 Malignant neoplasm of sigmoid colon: Secondary | ICD-10-CM | POA: Insufficient documentation

## 2024-03-19 DIAGNOSIS — Z95828 Presence of other vascular implants and grafts: Secondary | ICD-10-CM

## 2024-03-19 DIAGNOSIS — D500 Iron deficiency anemia secondary to blood loss (chronic): Secondary | ICD-10-CM

## 2024-03-19 LAB — COMPREHENSIVE METABOLIC PANEL WITH GFR
ALT: 27 U/L (ref 0–44)
AST: 35 U/L (ref 15–41)
Albumin: 3.8 g/dL (ref 3.5–5.0)
Alkaline Phosphatase: 89 U/L (ref 38–126)
Anion gap: 4 — ABNORMAL LOW (ref 5–15)
BUN: 8 mg/dL (ref 8–23)
CO2: 26 mmol/L (ref 22–32)
Calcium: 9.1 mg/dL (ref 8.9–10.3)
Chloride: 108 mmol/L (ref 98–111)
Creatinine, Ser: 0.85 mg/dL (ref 0.61–1.24)
GFR, Estimated: >60 mL/min (ref >60)
Glucose, Bld: 148 mg/dL — ABNORMAL HIGH (ref 70–99)
Potassium: 3.6 mmol/L (ref 3.5–5.1)
Sodium: 138 mmol/L (ref 135–145)
Total Bilirubin: 1.3 mg/dL — ABNORMAL HIGH (ref 0.0–1.2)
Total Protein: 7.4 g/dL (ref 6.5–8.1)

## 2024-03-19 LAB — CEA (ACCESS): CEA (CHCC): 4.09 ng/mL (ref 0.00–5.00)

## 2024-03-19 LAB — CBC WITH DIFFERENTIAL/PLATELET
Abs Immature Granulocytes: 0.01 10*3/uL (ref 0.00–0.07)
Basophils Absolute: 0 10*3/uL (ref 0.0–0.1)
Basophils Relative: 0 %
Eosinophils Absolute: 0.1 10*3/uL (ref 0.0–0.5)
Eosinophils Relative: 2 %
HCT: 33.7 % — ABNORMAL LOW (ref 39.0–52.0)
Hemoglobin: 12.1 g/dL — ABNORMAL LOW (ref 13.0–17.0)
Immature Granulocytes: 0 %
Lymphocytes Relative: 31 %
Lymphs Abs: 1.2 10*3/uL (ref 0.7–4.0)
MCH: 33.1 pg (ref 26.0–34.0)
MCHC: 35.9 g/dL (ref 30.0–36.0)
MCV: 92.1 fL (ref 80.0–100.0)
Monocytes Absolute: 0.4 10*3/uL (ref 0.1–1.0)
Monocytes Relative: 10 %
Neutro Abs: 2.3 10*3/uL (ref 1.7–7.7)
Neutrophils Relative %: 57 %
Platelets: 108 10*3/uL — ABNORMAL LOW (ref 150–400)
RBC: 3.66 MIL/uL — ABNORMAL LOW (ref 4.22–5.81)
RDW: 15.1 % (ref 11.5–15.5)
WBC: 4.1 10*3/uL (ref 4.0–10.5)
nRBC: 0 % (ref 0.0–0.2)

## 2024-03-19 LAB — FERRITIN: Ferritin: 62 ng/mL (ref 24–336)

## 2024-03-19 MED ORDER — SODIUM CHLORIDE 0.9% FLUSH
10.0000 mL | Freq: Once | INTRAVENOUS | Status: AC
  Administered 2024-03-19: 10 mL

## 2024-03-19 MED ORDER — HEPARIN SOD (PORK) LOCK FLUSH 100 UNIT/ML IV SOLN
500.0000 [IU] | Freq: Once | INTRAVENOUS | Status: AC
  Administered 2024-03-19: 500 [IU]

## 2024-03-19 MED ORDER — GABAPENTIN 100 MG PO CAPS
ORAL_CAPSULE | ORAL | 1 refills | Status: AC
Start: 2024-03-19 — End: ?

## 2024-03-19 NOTE — Progress Notes (Signed)
 Pt is here for port flush only. Reports numbness and tingling in feet and fingers bilaterally along with swelling/"fluid on my legs". He's in the chair with me. VS charted. Providers notified via secure chat. Heather NP saw pt chair side and ordered labs: cbc, cmp, cea, ferritin. Port deaccessed. No distress noted. Pt ambulated, self-transport.

## 2024-03-19 NOTE — Telephone Encounter (Signed)
 The patient was being seen for port flush only. Reports numbness and tingling in feet and fingers bilaterally along with swelling/"fluid on my legs". Labs ordered include CBC, CMP, Ferritin, and CEA. Added gabapentin  100 mg at night. If tolerated well, without negative side effects, may increase this to 2 times daily or 2 capsules at night. Reviewed negative side effects with the patient which, most commonly, are dizziness and drowsiness. He was advised not to drive or work after taking this medication until he knows how it makes him feel. He voiced understanding. I also encouraged him to wear compression socks when he will be up and on his feet. Swelling in the lower extremities is more likely to occur in hot and humid summer months, so this is especially important now. He should also elevate his feet when possible, at or above heart level, to encourage blood return to the heart. Will see the patient back in 4 weeks for repeat labs/port flush, and follow up.

## 2024-04-15 ENCOUNTER — Other Ambulatory Visit: Payer: Self-pay | Admitting: Nurse Practitioner

## 2024-04-15 DIAGNOSIS — C187 Malignant neoplasm of sigmoid colon: Secondary | ICD-10-CM

## 2024-04-15 NOTE — Assessment & Plan Note (Addendum)
 pT4bN0M0, MMR normal, G2 -Patient presented with perforated sigmoid colon and a contained abscess required IR drainage tube placement. -He underwent left hemicolectomy and partial small bowel resection.  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  All surgical margins were negative. -Due to the high risk of recurrence given T4b lesion and bowel perforation, I recommend adjuvant chemotherapy FOLFOX or CAPOX for 6 months if he can tolerate. He opted CAPOX and started on 09/12/2023.   -Surveillance CT scan on January 09, 2024 was negative for recurrence. -02/06/2024 - today is to be last treatment with chemotherapy.  Will finish current cycle of capecitabine .  Continue with flush appointments every 6 weeks.   - 04/16/2024 -CEA levels have been trending down.  Today, his CEA is 3.65. -- New CT CAP for restaging ordered during today's visit.

## 2024-04-15 NOTE — Progress Notes (Signed)
 Patient Care Team: Celestia Rosaline SQUIBB, NP as PCP - General (Internal Medicine) Lanny Callander, MD as Consulting Physician (Hematology and Oncology)  Clinic Day:  04/16/2024  Referring physician: Celestia Rosaline SQUIBB, NP  ASSESSMENT & PLAN:   Assessment & Plan: Cancer of sigmoid colon (HCC) pT4bN0M0, MMR normal, G2 -Patient presented with perforated sigmoid colon and a contained abscess required IR drainage tube placement. -He underwent left hemicolectomy and partial small bowel resection.  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  All surgical margins were negative. -Due to the high risk of recurrence given T4b lesion and bowel perforation, I recommend adjuvant chemotherapy FOLFOX or CAPOX for 6 months if he can tolerate. He opted CAPOX and started on 09/12/2023.   -Surveillance CT scan on January 09, 2024 was negative for recurrence. -02/06/2024 - today is to be last treatment with chemotherapy.  Will finish current cycle of capecitabine .  Continue with flush appointments every 6 weeks.   - 04/16/2024 -CEA levels have been trending down.  Today, his CEA is 3.65. -- New CT CAP for restaging ordered during today's visit.   Peripheral neuropathy Started gabapentin  at most recent visit.  Patient reports this is improving, but not as much as desired.  Will increase dose of gabapentin  due to persistent neuropathy.  New prescription is for 1 capsule in the a.m. and 2 capsules in p.m.  Will reassess at next visit.  Advised him he may also message or call if new doses still not enough to improve symptoms.  Plan Labs reviewed. - CBC and CMP are essentially unremarkable. -Ferritin equals 34. - CEA continues to trend downward and is normal at 3.65. Increased dose gabapentin  to 1 capsule in a.m. and 2 capsules in p.m. to help with persistent peripheral neuropathy. New restaging CT CAP ordered during today's  visit. Labs/flush and follow-up in 4 weeks.   The patient understands the plans discussed today and is in agreement with them.  He knows to contact our office if he develops concerns prior to his next appointment.  I provided 25 minutes of face-to-face time during this encounter and > 50% was spent counseling as documented under my assessment and plan.    Powell FORBES Lessen, NP  [facility] - A DEPT OF [facility] [address] [place] [address] Dept: [phone] Dept Fax: [phone]   Orders Placed This Encounter  Procedures   CT CHEST ABDOMEN PELVIS W CONTRAST    Standing Status:   Future    Expected Date:   05/07/2024    Expiration Date:   04/16/2025    If indicated for the ordered procedure, I authorize the administration of contrast media per Radiology protocol:   Yes    Does the patient have a contrast media/X-ray dye allergy?:   No    Preferred imaging location?:   [facility]    If indicated for the ordered procedure, I authorize the administration of oral contrast media per Radiology protocol:   Yes      CHIEF COMPLAINT:  CC: Cancer of sigmoid colon  Current Treatment: Adjuvant chemotherapy CapeOx (completed 02/06/2024)  INTERVAL HISTORY:  [facility] is here today for repeat clinical assessment.  He was last seen by me on 02/06/2024.  That was his last scheduled dose of chemotherapy.  Did have baseline neuropathy in his feet.  Started gabapentin , but has  not been as effective as he had hoped.  Overall, he reports doing well. He denies chest pain, chest pressure, or shortness of breath. He denies headaches or visual disturbances. He denies abdominal pain, nausea, vomiting, or changes in bowel or bladder habits.   He denies fevers or chills. He denies pain. His appetite is good and improving. His fatigue is also improving.  His weight has been stable.  I have reviewed the past medical history, past  surgical history, social history and family history with the patient and they are unchanged from previous note.  ALLERGIES:  is allergic to codeine.  MEDICATIONS:  Current Outpatient Medications  Medication Sig Dispense Refill   amLODipine  (NORVASC ) 10 MG tablet Take 1 tablet by mouth once daily 90 tablet 0   capecitabine  (XELODA ) 500 MG tablet Take 3 tablets (1500 mg total) by mouth in morning and 4 tablets (2000 mg total) in evening. Take within 30 minutes after meals. Take for 14 days, then off for 7 days. Repeat every 21 days. 98 tablet 1   diclofenac  Sodium (VOLTAREN ) 1 % GEL Research Patient: Apply 0.5 grams (1 fingertip) to each hand and each foot twice daily for up to 12 weeks 400 g 0   gabapentin  (NEURONTIN ) 100 MG capsule Take 1 capsule q am and 2 capsules po QPM 60 capsule 1   lidocaine -prilocaine  (EMLA ) cream Apply to affected area once 30 g 3   metFORMIN  (GLUCOPHAGE ) 500 MG tablet TAKE 1 TABLET BY MOUTH TWICE DAILY WITH A MEAL 180 tablet 0   ondansetron  (ZOFRAN ) 8 MG tablet Take 1 tablet (8 mg total) by mouth every 8 (eight) hours as needed for nausea or vomiting. Start on the third day after chemotherapy. 30 tablet 1   pravastatin  (PRAVACHOL ) 20 MG tablet Take 1 tablet by mouth once daily 90 tablet 0   prochlorperazine  (COMPAZINE ) 10 MG tablet Take 1 tablet (10 mg total) by mouth every 6 (six) hours as needed for nausea or vomiting. 30 tablet 1   No current facility-administered medications for this visit.    HISTORY OF PRESENT ILLNESS:   Oncology History  Cancer of sigmoid colon (HCC)  05/21/2023 Imaging   CT ABDOMEN PELVIS W CONTRAST   IMPRESSION: 1. Sigmoid colon bowel wall thickening may reflect a colonic mass or focal diverticulitis. Multiple rim enhancing fluid collections in the abdomen or pelvis likely reflect abscesses and suggest bowel perforation due to the colonic mass/focal diverticulitis. 2. Multiple loops of dilated small bowel with a possible  transition to decompressed small bowel in the lower right abdomen. This may reflect ileus or small-bowel obstruction and is likely related to the multiple abdominal abscesses/intraperitoneal inflammatory process.   06/03/2023 Imaging   CT ABDOMEN PELVIS W CONTRAST    IMPRESSION: 1. Interval placement of midline anterior and left transgluteal approach pelvic drains. The abscesses have resolved. 2. Masslike thickening of the sigmoid colon remains highly suspicious for primary neoplasm. 3. Soft tissue nodular implant in the right lower quadrant measuring 1.5 cm is suspicious for metastatic implant.     06/08/2023 Pathology Results   Diagnosis 1. Colon, polyp(s), transverse and ascending (3) - TUBULAR ADENOMA, FRAGMENTS. 2. Colon, polyp(s), sigmoid and descending (2) - TUBULOVILLOUS/TUBULAR ADENOMA, FRAGMENTS. 3. 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 NOTE: IT IS NOTED THAT THIS IS SUBMITTED AS A MASS. WHILE THERE IS FOCAL SPLAYING OF SMOOTH MUSCLE DEFINITIVE EVIDENCE OF INVASION INCLUDING DESMOPLASIA OR THE PRESENCE OF SUBMUCOSAL TISSUE IS NOT PRESENT  IN THESE SUPERFICIAL BIOPSIES. CLINICAL/ENDOSCOPIC CORRELATION NECESSARY. 4. Rectum, polyp(s), (2) - TUBULOVILLOUS ADENOMA, FRAGMENTS.   06/30/2023 PET scan   NM PET IMAGE initial (PI) SKULL BASE TO THIGH  IMPRESSION: 1. Hypermetabolic mass in the proximal sigmoid colon consistent with primary colorectal carcinoma. 2. No evidence of metastatic adenopathy or peritoneal metastasis. No liver metastasis or distant metastasis   07/10/2023 Initial Diagnosis   Cancer of sigmoid colon [facility])    Imaging     08/15/2023 Cancer Staging   Staging form: Colon and Rectum, AJCC 8th Edition - Pathologic stage from 08/15/2023: Stage IIC (pT4b, pN0, cM0) - Signed by Lanny Callander, MD on 08/30/2023 Total positive nodes: 0 Histologic grading system: 4 grade system Histologic grade (G):  G2 Residual tumor (R): R0 - None   09/12/2023 -  Chemotherapy   Patient is on Treatment Plan : COLORECTAL Xelox (Capeox)(130/850) q21d         REVIEW OF SYSTEMS:   Constitutional: Denies fevers, chills or abnormal weight loss. Improved fatigue.  Eyes: Denies blurriness of vision Ears, nose, mouth, throat, and face: Denies mucositis or sore throat Respiratory: Denies cough, dyspnea or wheezes Cardiovascular: Denies palpitation, chest discomfort or lower extremity swelling Gastrointestinal:  Denies nausea, heartburn or change in bowel habits Skin: Denies abnormal skin rashes Lymphatics: Denies new lymphadenopathy or easy bruising Neurological:Denies numbness, tingling or new weaknesses Behavioral/Psych: Mood is stable, no new changes  All other systems were reviewed with the patient and are negative.   VITALS:   Today's Vitals   04/16/24 1330 04/16/24 1331  BP: 134/62   Pulse: 91   Resp: 17   Temp: 98.6 F (37 C)   TempSrc: Temporal   SpO2: 98%   Weight: 212 lb 11.2 oz (96.5 kg)   Height: 6' 2 (1.88 m)   PainSc:  0-No pain   Body mass index is 27.31 kg/m.   Wt Readings from Last 3 Encounters:  04/16/24 212 lb 11.2 oz (96.5 kg)  02/06/24 211 lb 11.2 oz (96 kg)  01/16/24 212 lb 3.2 oz (96.3 kg)    Body mass index is 27.31 kg/m.  Performance status (ECOG): 1 - Symptomatic but completely ambulatory  PHYSICAL EXAM:   GENERAL:alert, no distress and comfortable SKIN: skin color, texture, turgor are normal, no rashes or significant lesions EYES: normal, Conjunctiva are pink and non-injected, sclera clear OROPHARYNX:no exudate, no erythema and lips, buccal mucosa, and tongue normal  NECK: supple, thyroid normal size, non-tender, without nodularity LYMPH:  no palpable lymphadenopathy in the cervical, axillary or inguinal LUNGS: clear to auscultation and percussion with normal breathing effort HEART: regular rate & rhythm and no murmurs and no lower extremity  edema ABDOMEN:abdomen soft, non-tender and normal bowel sounds Musculoskeletal:no cyanosis of digits and no clubbing  NEURO: alert & oriented x 3 with fluent speech, no focal motor/sensory deficits  LABORATORY DATA:  I have reviewed the data as listed    Component Value Date/Time   NA 140 04/16/2024 1256   NA 141 06/13/2023 1440   K 3.7 04/16/2024 1256   CL 109 04/16/2024 1256   CO2 26 04/16/2024 1256   GLUCOSE 176 (H) 04/16/2024 1256   BUN 12 04/16/2024 1256   BUN 6 (L) 06/13/2023 1440   CREATININE 0.93 04/16/2024 1256   CALCIUM 9.3 04/16/2024 1256   PROT 7.6 04/16/2024 1256   PROT 6.8 06/13/2023 1440   ALBUMIN 4.0 04/16/2024 1256   ALBUMIN 3.8 (L) 06/13/2023 1440   AST 27 04/16/2024 1256   ALT  22 04/16/2024 1256   ALKPHOS 78 04/16/2024 1256   BILITOT 1.3 (H) 04/16/2024 1256   GFRNONAA >60 04/16/2024 1256     Lab Results  Component Value Date   WBC 5.1 04/16/2024   NEUTROABS 3.0 04/16/2024   HGB 12.6 (L) 04/16/2024   HCT 35.0 (L) 04/16/2024   MCV 89.3 04/16/2024   PLT 121 (L) 04/16/2024

## 2024-04-16 ENCOUNTER — Inpatient Hospital Stay: Attending: Hematology

## 2024-04-16 ENCOUNTER — Inpatient Hospital Stay (HOSPITAL_BASED_OUTPATIENT_CLINIC_OR_DEPARTMENT_OTHER): Admitting: Nurse Practitioner

## 2024-04-16 VITALS — BP 134/62 | HR 91 | Temp 98.6°F | Resp 17 | Ht 74.0 in | Wt 212.7 lb

## 2024-04-16 DIAGNOSIS — C187 Malignant neoplasm of sigmoid colon: Secondary | ICD-10-CM | POA: Diagnosis not present

## 2024-04-16 DIAGNOSIS — Z95828 Presence of other vascular implants and grafts: Secondary | ICD-10-CM

## 2024-04-16 DIAGNOSIS — G629 Polyneuropathy, unspecified: Secondary | ICD-10-CM | POA: Diagnosis not present

## 2024-04-16 DIAGNOSIS — R970 Elevated carcinoembryonic antigen [CEA]: Secondary | ICD-10-CM | POA: Insufficient documentation

## 2024-04-16 DIAGNOSIS — Z79899 Other long term (current) drug therapy: Secondary | ICD-10-CM | POA: Insufficient documentation

## 2024-04-16 LAB — CBC WITH DIFFERENTIAL (CANCER CENTER ONLY)
Abs Immature Granulocytes: 0.01 10*3/uL (ref 0.00–0.07)
Basophils Absolute: 0 10*3/uL (ref 0.0–0.1)
Basophils Relative: 1 %
Eosinophils Absolute: 0.1 10*3/uL (ref 0.0–0.5)
Eosinophils Relative: 1 %
HCT: 35 % — ABNORMAL LOW (ref 39.0–52.0)
Hemoglobin: 12.6 g/dL — ABNORMAL LOW (ref 13.0–17.0)
Immature Granulocytes: 0 %
Lymphocytes Relative: 31 %
Lymphs Abs: 1.6 10*3/uL (ref 0.7–4.0)
MCH: 32.1 pg (ref 26.0–34.0)
MCHC: 36 g/dL (ref 30.0–36.0)
MCV: 89.3 fL (ref 80.0–100.0)
Monocytes Absolute: 0.4 10*3/uL (ref 0.1–1.0)
Monocytes Relative: 9 %
Neutro Abs: 3 10*3/uL (ref 1.7–7.7)
Neutrophils Relative %: 58 %
Platelet Count: 121 10*3/uL — ABNORMAL LOW (ref 150–400)
RBC: 3.92 MIL/uL — ABNORMAL LOW (ref 4.22–5.81)
RDW: 13.8 % (ref 11.5–15.5)
WBC Count: 5.1 10*3/uL (ref 4.0–10.5)
nRBC: 0 % (ref 0.0–0.2)

## 2024-04-16 LAB — CMP (CANCER CENTER ONLY)
ALT: 22 U/L (ref 0–44)
AST: 27 U/L (ref 15–41)
Albumin: 4 g/dL (ref 3.5–5.0)
Alkaline Phosphatase: 78 U/L (ref 38–126)
Anion gap: 5 (ref 5–15)
BUN: 12 mg/dL (ref 8–23)
CO2: 26 mmol/L (ref 22–32)
Calcium: 9.3 mg/dL (ref 8.9–10.3)
Chloride: 109 mmol/L (ref 98–111)
Creatinine: 0.93 mg/dL (ref 0.61–1.24)
GFR, Estimated: >60 mL/min (ref >60)
Glucose, Bld: 176 mg/dL — ABNORMAL HIGH (ref 70–99)
Potassium: 3.7 mmol/L (ref 3.5–5.1)
Sodium: 140 mmol/L (ref 135–145)
Total Bilirubin: 1.3 mg/dL — ABNORMAL HIGH (ref 0.0–1.2)
Total Protein: 7.6 g/dL (ref 6.5–8.1)

## 2024-04-16 LAB — CEA (ACCESS): CEA (CHCC): 3.65 ng/mL (ref 0.00–5.00)

## 2024-04-16 LAB — FERRITIN: Ferritin: 34 ng/mL (ref 24–336)

## 2024-04-16 MED ORDER — SODIUM CHLORIDE 0.9% FLUSH
10.0000 mL | Freq: Once | INTRAVENOUS | Status: AC
  Administered 2024-04-16: 10 mL

## 2024-04-16 MED ORDER — HEPARIN SOD (PORK) LOCK FLUSH 100 UNIT/ML IV SOLN
500.0000 [IU] | Freq: Once | INTRAVENOUS | Status: AC
  Administered 2024-04-16: 500 [IU]

## 2024-04-16 MED ORDER — GABAPENTIN 100 MG PO CAPS: ORAL_CAPSULE | ORAL | 1 refills | Status: DC

## 2024-04-25 ENCOUNTER — Encounter: Payer: Self-pay | Admitting: Nurse Practitioner

## 2024-04-25 ENCOUNTER — Encounter: Payer: Self-pay | Admitting: Hematology

## 2024-05-07 ENCOUNTER — Ambulatory Visit (HOSPITAL_COMMUNITY)
Admission: RE | Admit: 2024-05-07 | Discharge: 2024-05-07 | Disposition: A | Discharge status: Home / Self Care | Source: Ambulatory Visit | Attending: Nurse Practitioner | Admitting: Nurse Practitioner

## 2024-05-07 DIAGNOSIS — C187 Malignant neoplasm of sigmoid colon: Secondary | ICD-10-CM | POA: Diagnosis present

## 2024-05-07 MED ORDER — IOHEXOL 300 MG/ML  SOLN
100.0000 mL | Freq: Once | INTRAMUSCULAR | Status: AC | PRN
  Administered 2024-05-07: 100 mL via INTRAVENOUS

## 2024-05-07 MED ORDER — IOHEXOL 9 MG/ML PO SOLN
1000.0000 mL | Freq: Once | ORAL | Status: AC
  Administered 2024-05-07: 1000 mL via ORAL

## 2024-05-13 NOTE — Assessment & Plan Note (Addendum)
 pT4bN0M0, MMR normal, G2 -Patient presented with perforated sigmoid colon and a contained abscess required IR drainage tube placement. -He underwent left hemicolectomy and partial small bowel resection.  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  All surgical margins were negative. -Due to the high risk of recurrence given T4b lesion and bowel perforation, I recommend adjuvant chemotherapy FOLFOX or CAPOX for 6 months if he can tolerate. He opted CAPOX and started on 09/12/2023 and completed in April 2025. -Surveillance CT scan on January 09, 2024 and 05/07/2024 were negative for recurrence.

## 2024-05-14 ENCOUNTER — Inpatient Hospital Stay (HOSPITAL_BASED_OUTPATIENT_CLINIC_OR_DEPARTMENT_OTHER): Attending: Hematology | Admitting: Hematology

## 2024-05-14 ENCOUNTER — Inpatient Hospital Stay: Attending: Hematology

## 2024-05-14 VITALS — BP 120/66 | HR 80 | Temp 98.8°F | Resp 15 | Ht 74.0 in | Wt 213.0 lb

## 2024-05-14 DIAGNOSIS — Z95828 Presence of other vascular implants and grafts: Secondary | ICD-10-CM

## 2024-05-14 DIAGNOSIS — T451X5A Adverse effect of antineoplastic and immunosuppressive drugs, initial encounter: Secondary | ICD-10-CM | POA: Diagnosis not present

## 2024-05-14 DIAGNOSIS — Z79899 Other long term (current) drug therapy: Secondary | ICD-10-CM | POA: Insufficient documentation

## 2024-05-14 DIAGNOSIS — C187 Malignant neoplasm of sigmoid colon: Secondary | ICD-10-CM | POA: Diagnosis present

## 2024-05-14 DIAGNOSIS — G620 Drug-induced polyneuropathy: Secondary | ICD-10-CM | POA: Diagnosis not present

## 2024-05-14 DIAGNOSIS — D500 Iron deficiency anemia secondary to blood loss (chronic): Secondary | ICD-10-CM

## 2024-05-14 LAB — CBC WITH DIFFERENTIAL/PLATELET
Abs Immature Granulocytes: 0.01 K/uL (ref 0.00–0.07)
Basophils Absolute: 0 K/uL (ref 0.0–0.1)
Basophils Relative: 0 %
Eosinophils Absolute: 0.1 K/uL (ref 0.0–0.5)
Eosinophils Relative: 1 %
HCT: 36 % — ABNORMAL LOW (ref 39.0–52.0)
Hemoglobin: 13.1 g/dL (ref 13.0–17.0)
Immature Granulocytes: 0 %
Lymphocytes Relative: 27 %
Lymphs Abs: 1.5 K/uL (ref 0.7–4.0)
MCH: 31.1 pg (ref 26.0–34.0)
MCHC: 36.4 g/dL — ABNORMAL HIGH (ref 30.0–36.0)
MCV: 85.5 fL (ref 80.0–100.0)
Monocytes Absolute: 0.4 K/uL (ref 0.1–1.0)
Monocytes Relative: 8 %
Neutro Abs: 3.4 K/uL (ref 1.7–7.7)
Neutrophils Relative %: 64 %
Platelets: 136 K/uL — ABNORMAL LOW (ref 150–400)
RBC: 4.21 MIL/uL — ABNORMAL LOW (ref 4.22–5.81)
RDW: 13.2 % (ref 11.5–15.5)
WBC: 5.5 K/uL (ref 4.0–10.5)
nRBC: 0 % (ref 0.0–0.2)

## 2024-05-14 LAB — COMPREHENSIVE METABOLIC PANEL WITH GFR
ALT: 22 U/L (ref 0–44)
AST: 26 U/L (ref 15–41)
Albumin: 4 g/dL (ref 3.5–5.0)
Alkaline Phosphatase: 91 U/L (ref 38–126)
Anion gap: 4 — ABNORMAL LOW (ref 5–15)
BUN: 15 mg/dL (ref 8–23)
CO2: 28 mmol/L (ref 22–32)
Calcium: 9.6 mg/dL (ref 8.9–10.3)
Chloride: 108 mmol/L (ref 98–111)
Creatinine, Ser: 1 mg/dL (ref 0.61–1.24)
GFR, Estimated: >60 mL/min (ref >60)
Glucose, Bld: 107 mg/dL — ABNORMAL HIGH (ref 70–99)
Potassium: 3.8 mmol/L (ref 3.5–5.1)
Sodium: 140 mmol/L (ref 135–145)
Total Bilirubin: 1.3 mg/dL — ABNORMAL HIGH (ref 0.0–1.2)
Total Protein: 7.5 g/dL (ref 6.5–8.1)

## 2024-05-14 LAB — CEA (ACCESS): CEA (CHCC): 3.72 ng/mL (ref 0.00–5.00)

## 2024-05-14 LAB — FERRITIN: Ferritin: 32 ng/mL (ref 24–336)

## 2024-05-14 MED ORDER — HEPARIN SOD (PORK) LOCK FLUSH 100 UNIT/ML IV SOLN
500.0000 [IU] | Freq: Once | INTRAVENOUS | Status: AC
Start: 2024-05-14 — End: 2024-05-14
  Administered 2024-05-14: 500 [IU]

## 2024-05-14 MED ORDER — SODIUM CHLORIDE 0.9% FLUSH
10.0000 mL | Freq: Once | INTRAVENOUS | Status: AC
Start: 2024-05-14 — End: 2024-05-14
  Administered 2024-05-14: 10 mL

## 2024-05-14 NOTE — Progress Notes (Signed)
 [facility]   Telephone:([phone] [phone]   Clinic Follow up Note   Patient Care Team: Celestia Rosaline SQUIBB, NP as PCP - General (Internal Medicine) Lanny Callander, MD as Consulting Physician (Hematology and Oncology)  Date of Service:  05/14/2024  CHIEF COMPLAINT: f/u of his colon cancer  CURRENT THERAPY:  Cancer surveillance  Oncology History   Cancer of sigmoid colon (HCC) pT4bN0M0, MMR normal, G2 -Patient presented with perforated sigmoid colon and a contained abscess required IR drainage tube placement. -He underwent left hemicolectomy and partial small bowel resection.  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  All surgical margins were negative. -Due to the high risk of recurrence given T4b lesion and bowel perforation, I recommend adjuvant chemotherapy FOLFOX or CAPOX for 6 months if he can tolerate. He opted CAPOX and started on 09/12/2023 and completed in April 2025. -Surveillance CT scan on January 09, 2024 and 05/07/2024 were negative for recurrence.  Assessment & Plan Stage II colon cancer Completed chemotherapy in June 2025. Recent CT scan on May 07, 2024, showed no evidence of recurrence. Continued surveillance is necessary due to the advanced nature of the initial disease. - Repeat CT scan every six months for surveillance. - Perform Signatera test at the next visit in six weeks to monitor for circulating tumor DNA. - Schedule port flush every six to eight weeks. - Schedule follow-up appointments every three months.  Peripheral neuropathy secondary to chemotherapy Peripheral neuropathy affecting the feet with symptoms of pain and tingling, impacting daily activities such as walking. Current gabapentin  regimen is not adequately controlling symptoms. - Increase gabapentin  dosage to two capsules in the morning and two in the afternoon, with the option to  increase to three capsules if needed. - Monitor symptoms and adjust gabapentin  dosage as necessary. - Advise to contact the office for a gabapentin  refill if needed due to increased dosage.  Plan - Lab and CT scan reviewed, NED - Continue cancer surveillance.  Port flush every 6 weeks, next Signatera in 6 weeks - Follow-up in 3 months lab and follow-up in 3 months   SUMMARY OF ONCOLOGIC HISTORY: Oncology History  Cancer of sigmoid colon (HCC)  05/21/2023 Imaging   CT ABDOMEN PELVIS W CONTRAST   IMPRESSION: 1. Sigmoid colon bowel wall thickening may reflect a colonic mass or focal diverticulitis. Multiple rim enhancing fluid collections in the abdomen or pelvis likely reflect abscesses and suggest bowel perforation due to the colonic mass/focal diverticulitis. 2. Multiple loops of dilated small bowel with a possible transition to decompressed small bowel in the lower right abdomen. This may reflect ileus or small-bowel obstruction and is likely related to the multiple abdominal abscesses/intraperitoneal inflammatory process.   06/03/2023 Imaging   CT ABDOMEN PELVIS W CONTRAST    IMPRESSION: 1. Interval placement of midline anterior and left transgluteal approach pelvic drains. The abscesses have resolved. 2. Masslike thickening of the sigmoid colon remains highly suspicious for primary neoplasm. 3. Soft tissue nodular implant in the right lower quadrant measuring 1.5 cm is suspicious for metastatic implant.     06/08/2023 Pathology Results   Diagnosis 1. Colon, polyp(s), transverse and ascending (3) - TUBULAR ADENOMA, FRAGMENTS. 2. Colon, polyp(s), sigmoid and descending (2) - TUBULOVILLOUS/TUBULAR ADENOMA, FRAGMENTS. 3. 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 NOTE: IT IS NOTED THAT THIS IS SUBMITTED AS A MASS. WHILE THERE IS FOCAL SPLAYING  OF SMOOTH MUSCLE DEFINITIVE EVIDENCE OF INVASION INCLUDING DESMOPLASIA  OR THE PRESENCE OF SUBMUCOSAL TISSUE IS NOT PRESENT IN THESE SUPERFICIAL BIOPSIES. CLINICAL/ENDOSCOPIC CORRELATION NECESSARY. 4. Rectum, polyp(s), (2) - TUBULOVILLOUS ADENOMA, FRAGMENTS.   06/30/2023 PET scan   NM PET IMAGE initial (PI) SKULL BASE TO THIGH  IMPRESSION: 1. Hypermetabolic mass in the proximal sigmoid colon consistent with primary colorectal carcinoma. 2. No evidence of metastatic adenopathy or peritoneal metastasis. No liver metastasis or distant metastasis   07/10/2023 Initial Diagnosis   Cancer of sigmoid colon [name])    Imaging     08/15/2023 Cancer Staging   Staging form: Colon and Rectum, AJCC 8th Edition - Pathologic stage from 08/15/2023: Stage IIC (pT4b, pN0, cM0) - Signed by Lanny Callander, MD on 08/30/2023 Total positive nodes: 0 Histologic grading system: 4 grade system Histologic grade (G): G2 Residual tumor (R): R0 - None   09/12/2023 -  Chemotherapy   Patient is on Treatment Plan : COLORECTAL Xelox (Capeox)(130/850) q21d        Discussed the use of AI scribe software for clinical note transcription with the patient, who gave verbal consent to proceed.  History of Present Illness Duane [name] is a 63 year old male with colon cancer who presents for follow-up after completing chemotherapy.  He experiences ongoing neuropathy in his feet, characterized by tingling and numbness, affecting daily activities and sleep. He can walk about a mile despite the discomfort.  He takes gabapentin , one capsule in the morning and two at night, but finds it insufficiently effective, as it does not alleviate numbness and only partially suppresses tingling and pain.  He completed chemotherapy with capecitabine  in June 2025 and has a port in place.     All other systems were reviewed with the patient and are negative.  MEDICAL HISTORY:  Past Medical History:  Diagnosis Date   Colon cancer (HCC)    Diabetes mellitus without complication (HCC) type 2    GERD  (gastroesophageal reflux disease)    Hypertension     SURGICAL HISTORY: Past Surgical History:  Procedure Laterality Date   COLONOSCOPY     FLEXIBLE SIGMOIDOSCOPY N/A 08/15/2023   Procedure: FLEXIBLE SIGMOIDOSCOPY;  Surgeon: Teresa Lonni HERO, MD;  Location: [facility];  Service: General;  Laterality: N/A;   IR RADIOLOGIST EVAL & MGMT  06/03/2023   LAPAROSCOPY N/A 08/15/2023   Procedure: LAPAROSCOPY DIAGNOSTIC;  Surgeon: Teresa Lonni HERO, MD;  Location: [facility];  Service: General;  Laterality: N/A;   PORTACATH PLACEMENT Right 09/09/2023   Procedure: INSERTION PORT-A-CATH ULTRASOUND FLUOROSCOPY;  Surgeon: Teresa Lonni HERO, MD;  Location: [facility];  Service: General;  Laterality: Right;   XI ROBOTIC ASSISTED LOWER ANTERIOR RESECTION N/A 08/15/2023   Procedure: XI ROBOTIC ASSISTED LOWER ANTERIOR RESECTION, SMALL BOWEL RESECTION, LYSIS OF ADHESIONS, AND INTRAOPERATIVE ASSESSMENT OF PERFUSION USING ICG DYE;  Surgeon: Teresa Lonni HERO, MD;  Location: [facility];  Service: General;  Laterality: N/A;    I have reviewed the social history and family history with the patient and they are unchanged from previous note.  ALLERGIES:  is allergic to codeine.  MEDICATIONS:  Current Outpatient Medications  Medication Sig Dispense Refill   amLODipine  (NORVASC ) 10 MG tablet Take 1 tablet by mouth once daily 90 tablet 0   diclofenac  Sodium (VOLTAREN ) 1 % GEL Research Patient: Apply 0.5 grams (1 fingertip) to each hand and each foot twice daily for up to 12 weeks 400 g 0   gabapentin  (NEURONTIN ) 100 MG capsule  Take 1 capsule q am and 2 capsules po QPM 60 capsule 1   lidocaine -prilocaine  (EMLA ) cream Apply to affected area once 30 g 3   metFORMIN  (GLUCOPHAGE ) 500 MG tablet TAKE 1 TABLET BY MOUTH TWICE DAILY WITH A MEAL 180 tablet 0   ondansetron  (ZOFRAN ) 8 MG tablet Take 1 tablet (8 mg total) by mouth every 8 (eight) hours as needed for nausea or vomiting. Start on the third day after  chemotherapy. 30 tablet 1   pravastatin  (PRAVACHOL ) 20 MG tablet Take 1 tablet by mouth once daily 90 tablet 0   prochlorperazine  (COMPAZINE ) 10 MG tablet Take 1 tablet (10 mg total) by mouth every 6 (six) hours as needed for nausea or vomiting. 30 tablet 1   No current facility-administered medications for this visit.    PHYSICAL EXAMINATION: ECOG PERFORMANCE STATUS: 1 - Symptomatic but completely ambulatory  Vitals:   05/14/24 1314  BP: 120/66  Pulse: 80  Resp: 15  Temp: 98.8 F (37.1 C)  SpO2: 99%   Wt Readings from Last 3 Encounters:  05/14/24 96.6 kg (213 lb)  04/16/24 96.5 kg (212 lb 11.2 oz)  02/06/24 96 kg (211 lb 11.2 oz)     GENERAL:alert, no distress and comfortable SKIN: skin color, texture, turgor are normal, no rashes or significant lesions EYES: normal, Conjunctiva are pink and non-injected, sclera clear NECK: supple, thyroid normal size, non-tender, without nodularity LYMPH:  no palpable lymphadenopathy in the cervical, axillary  LUNGS: clear to auscultation and percussion with normal breathing effort HEART: regular rate & rhythm and no murmurs and no lower extremity edema ABDOMEN:abdomen soft, non-tender and normal bowel sounds Musculoskeletal:no cyanosis of digits and no clubbing  NEURO: alert & oriented x 3 with fluent speech, no focal motor/sensory deficits  Physical Exam    LABORATORY DATA:  I have reviewed the data as listed    Latest Ref Rng & Units 05/14/2024   12:58 PM 04/16/2024   12:56 PM 03/19/2024   11:49 AM  CBC  WBC 4.0 - 10.5 K/uL 5.5  5.1  4.1   Hemoglobin 13.0 - 17.0 g/dL 86.8  87.3  87.8   Hematocrit 39.0 - 52.0 % 36.0  35.0  33.7   Platelets 150 - 400 K/uL 136  121  108         Latest Ref Rng & Units 05/14/2024   12:58 PM 04/16/2024   12:56 PM 03/19/2024   11:49 AM  CMP  Glucose 70 - 99 mg/dL 892  823  851   BUN 8 - 23 mg/dL 15  12  8    Creatinine 0.61 - 1.24 mg/dL 8.99  9.06  9.14   Sodium 135 - 145 mmol/L 140  140  138    Potassium 3.5 - 5.1 mmol/L 3.8  3.7  3.6   Chloride 98 - 111 mmol/L 108  109  108   CO2 22 - 32 mmol/L 28  26  26    Calcium 8.9 - 10.3 mg/dL 9.6  9.3  9.1   Total Protein 6.5 - 8.1 g/dL 7.5  7.6  7.4   Total Bilirubin 0.0 - 1.2 mg/dL 1.3  1.3  1.3   Alkaline Phos 38 - 126 U/L 91  78  89   AST 15 - 41 U/L 26  27  35   ALT 0 - 44 U/L 22  22  27        RADIOGRAPHIC STUDIES: I have personally reviewed the radiological images as listed and agreed with  the findings in the report. No results found.    No orders of the defined types were placed in this encounter.  All questions were answered. The patient knows to call the clinic with any problems, questions or concerns. No barriers to learning was detected. The total time spent in the appointment was 25 minutes, including review of chart and various tests results, discussions about plan of care and coordination of care plan     Onita Mattock, MD 05/14/2024

## 2024-05-15 ENCOUNTER — Other Ambulatory Visit: Payer: Self-pay

## 2024-05-15 DIAGNOSIS — C187 Malignant neoplasm of sigmoid colon: Secondary | ICD-10-CM

## 2024-05-15 NOTE — Progress Notes (Unsigned)
 Verbal order w/readback from Dr. Lanny for Signatera Q70mths w/1st draw 06/25/2024.  Order placed in EPIC and on [facility] Portal.  Signatera kit along with requisition given to [employer].

## 2024-06-01 ENCOUNTER — Other Ambulatory Visit: Payer: Self-pay

## 2024-06-25 ENCOUNTER — Inpatient Hospital Stay: Attending: Hematology

## 2024-06-25 DIAGNOSIS — C187 Malignant neoplasm of sigmoid colon: Secondary | ICD-10-CM | POA: Insufficient documentation

## 2024-06-25 DIAGNOSIS — D500 Iron deficiency anemia secondary to blood loss (chronic): Secondary | ICD-10-CM

## 2024-06-25 DIAGNOSIS — G629 Polyneuropathy, unspecified: Secondary | ICD-10-CM | POA: Insufficient documentation

## 2024-06-25 DIAGNOSIS — Z95828 Presence of other vascular implants and grafts: Secondary | ICD-10-CM

## 2024-06-25 LAB — CBC WITH DIFFERENTIAL/PLATELET
Abs Immature Granulocytes: 0.02 K/uL (ref 0.00–0.07)
Basophils Absolute: 0 K/uL (ref 0.0–0.1)
Basophils Relative: 0 %
Eosinophils Absolute: 0.1 K/uL (ref 0.0–0.5)
Eosinophils Relative: 1 %
HCT: 36.8 % — ABNORMAL LOW (ref 39.0–52.0)
Hemoglobin: 13 g/dL (ref 13.0–17.0)
Immature Granulocytes: 0 %
Lymphocytes Relative: 31 %
Lymphs Abs: 1.7 K/uL (ref 0.7–4.0)
MCH: 29.7 pg (ref 26.0–34.0)
MCHC: 35.3 g/dL (ref 30.0–36.0)
MCV: 84 fL (ref 80.0–100.0)
Monocytes Absolute: 0.4 K/uL (ref 0.1–1.0)
Monocytes Relative: 8 %
Neutro Abs: 3.2 K/uL (ref 1.7–7.7)
Neutrophils Relative %: 60 %
Platelets: 135 K/uL — ABNORMAL LOW (ref 150–400)
RBC: 4.38 MIL/uL (ref 4.22–5.81)
RDW: 13.6 % (ref 11.5–15.5)
WBC: 5.4 K/uL (ref 4.0–10.5)
nRBC: 0 % (ref 0.0–0.2)

## 2024-06-25 LAB — COMPREHENSIVE METABOLIC PANEL WITH GFR
ALT: 21 U/L (ref 0–44)
AST: 23 U/L (ref 15–41)
Albumin: 4 g/dL (ref 3.5–5.0)
Alkaline Phosphatase: 77 U/L (ref 38–126)
Anion gap: 5 (ref 5–15)
BUN: 12 mg/dL (ref 8–23)
CO2: 27 mmol/L (ref 22–32)
Calcium: 9.4 mg/dL (ref 8.9–10.3)
Chloride: 107 mmol/L (ref 98–111)
Creatinine, Ser: 0.88 mg/dL (ref 0.61–1.24)
GFR, Estimated: >60 mL/min (ref >60)
Glucose, Bld: 161 mg/dL — ABNORMAL HIGH (ref 70–99)
Potassium: 3.9 mmol/L (ref 3.5–5.1)
Sodium: 139 mmol/L (ref 135–145)
Total Bilirubin: 1.1 mg/dL (ref 0.0–1.2)
Total Protein: 7.1 g/dL (ref 6.5–8.1)

## 2024-06-25 LAB — CEA (ACCESS): CEA (CHCC): 3.61 ng/mL (ref 0.00–5.00)

## 2024-06-25 LAB — GENETIC SCREENING ORDER

## 2024-06-25 LAB — FERRITIN: Ferritin: 35 ng/mL (ref 24–336)

## 2024-06-25 MED ORDER — SODIUM CHLORIDE 0.9% FLUSH
10.0000 mL | Freq: Once | INTRAVENOUS | Status: AC
  Administered 2024-06-25: 10 mL

## 2024-06-29 ENCOUNTER — Other Ambulatory Visit (INDEPENDENT_AMBULATORY_CARE_PROVIDER_SITE_OTHER): Payer: Self-pay | Admitting: Primary Care

## 2024-06-29 DIAGNOSIS — I10 Essential (primary) hypertension: Secondary | ICD-10-CM

## 2024-06-29 DIAGNOSIS — Z760 Encounter for issue of repeat prescription: Secondary | ICD-10-CM

## 2024-07-04 ENCOUNTER — Other Ambulatory Visit: Payer: Self-pay | Admitting: Nurse Practitioner

## 2024-07-04 ENCOUNTER — Other Ambulatory Visit (INDEPENDENT_AMBULATORY_CARE_PROVIDER_SITE_OTHER): Payer: Self-pay | Admitting: Primary Care

## 2024-07-04 DIAGNOSIS — Z760 Encounter for issue of repeat prescription: Secondary | ICD-10-CM

## 2024-07-04 DIAGNOSIS — E119 Type 2 diabetes mellitus without complications: Secondary | ICD-10-CM

## 2024-07-04 DIAGNOSIS — I10 Essential (primary) hypertension: Secondary | ICD-10-CM

## 2024-07-05 ENCOUNTER — Encounter: Payer: Self-pay | Admitting: *Deleted

## 2024-07-05 ENCOUNTER — Other Ambulatory Visit (INDEPENDENT_AMBULATORY_CARE_PROVIDER_SITE_OTHER): Payer: Self-pay | Admitting: Primary Care

## 2024-07-05 DIAGNOSIS — I10 Essential (primary) hypertension: Secondary | ICD-10-CM

## 2024-07-05 DIAGNOSIS — E119 Type 2 diabetes mellitus without complications: Secondary | ICD-10-CM

## 2024-07-05 DIAGNOSIS — Z760 Encounter for issue of repeat prescription: Secondary | ICD-10-CM

## 2024-07-05 NOTE — Telephone Encounter (Signed)
 Requested medication (s) are due for refill today:   yes for all 3  Requested medication (s) are on the active medication list:   Yes for all 3  Future visit scheduled:   No.    LOV 06/13/2023  MyChart message sent to call and make an appt. For refills   Last ordered: amlodipine  01/03/2024 #90, 0 refills;   metformin  10/05/2023 #180, 0 refills;   pravastatin  01/03/2024 #90, 0 refills  Unable to refill because OV needed and labs due   Requested Prescriptions  Pending Prescriptions Disp Refills   amLODipine  (NORVASC ) 10 MG tablet [Pharmacy Med Name: amLODIPine  Besylate 10 MG Oral Tablet] 90 tablet 0    Sig: Take 1 tablet by mouth once daily     Cardiovascular: Calcium Channel Blockers 2 Failed - 07/05/2024  3:49 PM      Failed - Valid encounter within last 6 months    Recent Outpatient Visits           1 year ago Essential hypertension   [place] [facility] Celestia Rosaline SQUIBB, NP   1 year ago Essential hypertension   [place] [facility] Celestia Rosaline SQUIBB, NP   1 year ago Type 2 diabetes mellitus without complication, without long-term current use of insulin  (HCC)   [place] [facility] Celestia Rosaline SQUIBB, NP   2 years ago Essential hypertension   [place] [facility] Celestia Rosaline SQUIBB, NP   2 years ago Colon cancer screening   [place] [facility] Celestia Rosaline SQUIBB, NP              Passed - Last BP in normal range    BP Readings from Last 1 Encounters:  05/14/24 120/66         Passed - Last Heart Rate in normal range    Pulse Readings from Last 1 Encounters:  05/14/24 80          metFORMIN  (GLUCOPHAGE ) 500 MG tablet [Pharmacy Med Name: metFORMIN  HCl 500 MG Oral Tablet] 180 tablet 0    Sig: TAKE 1 TABLET BY MOUTH TWICE DAILY WITH A MEAL     Endocrinology:  Diabetes - Biguanides Failed - 07/05/2024  3:49 PM      Failed - HBA1C is between 0 and 7.9 and within 180 days     HbA1c, POC (controlled diabetic range)  Date Value Ref Range Status  09/07/2022 6.3 0.0 - 7.0 % Final   Hgb A1c MFr Bld  Date Value Ref Range Status  08/12/2023 5.5 4.8 - 5.6 % Final    Comment:    (NOTE) Pre diabetes:          5.7%-6.4%  Diabetes:              >6.4%  Glycemic control for   <7.0% adults with diabetes          Failed - B12 Level in normal range and within 720 days    No results found for: VITAMINB12       Failed - Valid encounter within last 6 months    Recent Outpatient Visits           1 year ago Essential hypertension   [place] [facility] Celestia Rosaline SQUIBB, NP   1 year ago Essential hypertension   [place] [facility] Celestia Rosaline SQUIBB, NP   1 year ago Type 2 diabetes mellitus without complication, without long-term current use of insulin  (  HCC)   [place] [facility] Celestia Rosaline SQUIBB, NP   2 years ago Essential hypertension   [place] [facility] Celestia Rosaline SQUIBB, NP   2 years ago Colon cancer screening   [place] [facility] Celestia Rosaline SQUIBB, NP              Failed - CBC within normal limits and completed in the last 12 months    WBC  Date Value Ref Range Status  06/25/2024 5.4 4.0 - 10.5 K/uL Final   RBC  Date Value Ref Range Status  06/25/2024 4.38 4.22 - 5.81 MIL/uL Final   Hemoglobin  Date Value Ref Range Status  06/25/2024 13.0 13.0 - 17.0 g/dL Final  93/83/7974 87.3 (L) 13.0 - 17.0 g/dL Final  97/77/7975 86.2 13.0 - 17.7 g/dL Final   HCT  Date Value Ref Range Status  06/25/2024 36.8 (L) 39.0 - 52.0 % Final   Hematocrit  Date Value Ref Range Status  12/23/2022 42.1 37.5 - 51.0 % Final   MCHC  Date Value Ref Range Status  06/25/2024 35.3 30.0 - 36.0 g/dL Final   [facility]  Date Value Ref Range Status  06/25/2024 29.7 26.0 - 34.0 pg Final   MCV  Date Value Ref Range Status  06/25/2024 84.0 80.0 - 100.0 fL Final   12/23/2022 86 79 - 97 fL Final   No results found for: PLTCOUNTKUC, LABPLAT, POCPLA RDW  Date Value Ref Range Status  06/25/2024 13.6 11.5 - 15.5 % Final  12/23/2022 16.3 (H) 11.6 - 15.4 % Final         Passed - Cr in normal range and within 360 days    Creatinine  Date Value Ref Range Status  04/16/2024 0.93 0.61 - 1.24 mg/dL Final   Creatinine, Ser  Date Value Ref Range Status  06/25/2024 0.88 0.61 - 1.24 mg/dL Final         Passed - eGFR in normal range and within 360 days    GFR, Estimated  Date Value Ref Range Status  06/25/2024 >60 >60 mL/min Final    Comment:    (NOTE) Calculated using the CKD-EPI Creatinine Equation (2021)   04/16/2024 >60 >60 mL/min Final    Comment:    (NOTE) Calculated using the CKD-EPI Creatinine Equation (2021)    eGFR  Date Value Ref Range Status  06/13/2023 97 >59 mL/min/1.73 Final          pravastatin  (PRAVACHOL ) 20 MG tablet [Pharmacy Med Name: PRAVASTATIN  20MG     TAB] 90 tablet 0    Sig: Take 1 tablet by mouth once daily     Cardiovascular:  Antilipid - Statins Failed - 07/05/2024  3:49 PM      Failed - Valid encounter within last 12 months    Recent Outpatient Visits           1 year ago Essential hypertension   [place] [facility] Celestia Rosaline SQUIBB, NP   1 year ago Essential hypertension   [place] [facility] Celestia Rosaline SQUIBB, NP   1 year ago Type 2 diabetes mellitus without complication, without long-term current use of insulin  [facility])   [place] [facility] Celestia Rosaline SQUIBB, NP   2 years ago Essential hypertension   [place] [facility] Celestia Rosaline SQUIBB, NP   2 years ago Colon cancer screening   [place] [facility] Celestia Rosaline SQUIBB, NP  Failed - Lipid Panel in normal range within the last 12 months    Cholesterol, Total  Date Value Ref Range Status  06/13/2023 144 100 - 199 mg/dL Final    LDL Chol Calc (NIH)  Date Value Ref Range Status  06/13/2023 76 0 - 99 mg/dL Final   HDL  Date Value Ref Range Status  06/13/2023 50 >39 mg/dL Final   Triglycerides  Date Value Ref Range Status  06/13/2023 95 0 - 149 mg/dL Final         Passed - Patient is not pregnant

## 2024-07-06 ENCOUNTER — Telehealth (INDEPENDENT_AMBULATORY_CARE_PROVIDER_SITE_OTHER): Payer: Self-pay | Admitting: Primary Care

## 2024-07-06 ENCOUNTER — Other Ambulatory Visit (INDEPENDENT_AMBULATORY_CARE_PROVIDER_SITE_OTHER): Payer: Self-pay | Admitting: Primary Care

## 2024-07-06 DIAGNOSIS — E119 Type 2 diabetes mellitus without complications: Secondary | ICD-10-CM

## 2024-07-06 DIAGNOSIS — I10 Essential (primary) hypertension: Secondary | ICD-10-CM

## 2024-07-06 DIAGNOSIS — Z760 Encounter for issue of repeat prescription: Secondary | ICD-10-CM

## 2024-07-06 NOTE — Telephone Encounter (Signed)
 Copied from CRM [id number]. Topic: Clinical - Medication Question >> Jul 06, 2024  9:28 AM Myrick T wrote: Reason for CRM: patient has an appt scheduled for 9/29 at 2:10pm. He only has 1 pill left and would need enough of the amLODipine , pravastatin  and metFORMIN  to get his to his appt.

## 2024-07-06 NOTE — Telephone Encounter (Signed)
**Note De-identified  [place] Obfuscation** Please advise 

## 2024-07-08 ENCOUNTER — Other Ambulatory Visit (INDEPENDENT_AMBULATORY_CARE_PROVIDER_SITE_OTHER): Payer: Self-pay | Admitting: Primary Care

## 2024-07-09 ENCOUNTER — Other Ambulatory Visit (INDEPENDENT_AMBULATORY_CARE_PROVIDER_SITE_OTHER): Payer: Self-pay | Admitting: Primary Care

## 2024-07-09 DIAGNOSIS — Z760 Encounter for issue of repeat prescription: Secondary | ICD-10-CM

## 2024-07-09 DIAGNOSIS — I10 Essential (primary) hypertension: Secondary | ICD-10-CM

## 2024-07-09 DIAGNOSIS — E119 Type 2 diabetes mellitus without complications: Secondary | ICD-10-CM

## 2024-07-09 NOTE — Telephone Encounter (Unsigned)
 Copied from CRM [id number]. Topic: Clinical - Medication Refill >> Jul 09, 2024 10:28 AM Edsel HERO wrote: Medication:  amLODipine  (NORVASC ) 10 MG tablet metFORMIN  (GLUCOPHAGE ) 500 MG tablet pravastatin  (PRAVACHOL ) 20 MG tablet  Has the patient contacted their pharmacy? Yes  This is the patient's preferred pharmacy:  [facility] [address] ([address]), [place] - [address] [place] ([place]) [place] [address] Phone: [phone] Fax: [phone]  Is this the correct pharmacy for this prescription? Yes  Has the prescription been filled recently? Yes  Is the patient out of the medication? Yes  Has the patient been seen for an appointment in the last year OR does the patient have an upcoming appointment? Yes. Next OV 07/30/24.   Can we respond through MyChart? Yes

## 2024-07-09 NOTE — Telephone Encounter (Signed)
 Per supervisor, pt call recording noted that [facility] is preferred pharmacy. Routing to [place] as appropriate.

## 2024-07-09 NOTE — Telephone Encounter (Unsigned)
 Copied from CRM [id number]. Topic: Clinical - Medication Refill >> Jul 09, 2024  1:34 PM Everette C wrote: Medication: amLODipine  (NORVASC ) 10 MG tablet  metFORMIN  (GLUCOPHAGE ) 500 MG tablet  pravastatin  (PRAVACHOL ) 20 MG tablet  Has the patient contacted their pharmacy? Yes (Agent: If no, request that the patient contact the pharmacy for the refill. If patient does not wish to contact the pharmacy document the reason why and proceed with request.) (Agent: If yes, when and what did the pharmacy advise?)  This is the patient's preferred pharmacy:  [facility] [address] ([address] [place] - [address] [place] ([place]) [place] [address] Phone: [phone] Fax: [phone]  [facility] [address] [place] [address] Phone: [phone] Fax: [phone]  [facility] [address] [place] [address] Phone: [phone] Fax: [phone]  Is this the correct pharmacy for this prescription? Yes If no, delete pharmacy and type the correct one.   Has the prescription been filled recently? Yes  Is the patient out of the medication? Yes  Has the patient been seen for an appointment in the last year OR does the patient have an upcoming appointment? Yes  Can we respond through MyChart? No  Agent: Please be advised that Rx refills may take up to 3 business days. We ask that you follow-up with your pharmacy.

## 2024-07-10 ENCOUNTER — Encounter: Payer: Self-pay | Admitting: Hematology

## 2024-07-10 ENCOUNTER — Other Ambulatory Visit (INDEPENDENT_AMBULATORY_CARE_PROVIDER_SITE_OTHER): Payer: Self-pay | Admitting: Primary Care

## 2024-07-10 ENCOUNTER — Telehealth: Payer: Self-pay

## 2024-07-10 DIAGNOSIS — Z760 Encounter for issue of repeat prescription: Secondary | ICD-10-CM

## 2024-07-10 DIAGNOSIS — E119 Type 2 diabetes mellitus without complications: Secondary | ICD-10-CM

## 2024-07-10 DIAGNOSIS — I10 Essential (primary) hypertension: Secondary | ICD-10-CM

## 2024-07-10 MED ORDER — AMLODIPINE BESYLATE 10 MG PO TABS: 10.0000 mg | ORAL_TABLET | Freq: Every day | ORAL | 0 refills | Status: DC

## 2024-07-10 MED ORDER — METFORMIN HCL 500 MG PO TABS: 500.0000 mg | ORAL_TABLET | Freq: Two times a day (BID) | ORAL | 0 refills | Status: DC

## 2024-07-10 NOTE — Telephone Encounter (Signed)
 Requested medications are due for refill today.  yes  Requested medications are on the active medications list.  yes  Last refill. 01/03/2024  Future visit scheduled.   yes  Notes to clinic.  Labs are expired.    Requested Prescriptions  Pending Prescriptions Disp Refills   pravastatin  (PRAVACHOL ) 20 MG tablet 90 tablet 0    Sig: Take 1 tablet (20 mg total) by mouth daily.     Cardiovascular:  Antilipid - Statins Failed - 07/10/2024  1:46 PM      Failed - Valid encounter within last 12 months    Recent Outpatient Visits           1 year ago Essential hypertension   [place] [facility] Celestia Rosaline SQUIBB, NP   1 year ago Essential hypertension   [place] [facility] Celestia Rosaline SQUIBB, NP   1 year ago Type 2 diabetes mellitus without complication, without long-term current use of insulin  [facility])   [place] [facility] Celestia Rosaline SQUIBB, NP   2 years ago Essential hypertension   [place] [facility] Celestia Rosaline SQUIBB, NP   2 years ago Colon cancer screening   [place] [facility] Celestia Rosaline SQUIBB, NP              Failed - Lipid Panel in normal range within the last 12 months    Cholesterol, Total  Date Value Ref Range Status  06/13/2023 144 100 - 199 mg/dL Final   LDL Chol Calc (NIH)  Date Value Ref Range Status  06/13/2023 76 0 - 99 mg/dL Final   HDL  Date Value Ref Range Status  06/13/2023 50 >39 mg/dL Final   Triglycerides  Date Value Ref Range Status  06/13/2023 95 0 - 149 mg/dL Final         Passed - Patient is not pregnant

## 2024-07-10 NOTE — Telephone Encounter (Signed)
 Copied from CRM [id number]. Topic: Clinical - Medication Question >> Jul 10, 2024 10:12 AM Sophia H wrote: Reason for CRM: Patient is calling in again regarding refills that he requested, states he is out of meds. Just wants enough to get him to his next appt 09/29. # [phone]  amLODipine  (NORVASC ) 10 MG tablet metFORMIN  (GLUCOPHAGE ) 500 MG tablet pravastatin  (PRAVACHOL ) 20 MG tablet   [id number] [place] ([place]), [place] - [address] [place] ([place]) [place] [address] Phone: [phone] Fax: [phone]

## 2024-07-10 NOTE — Telephone Encounter (Signed)
 Requested medications are due for refill today.  yes  Requested medications are on the active medications list.  yes  Last refill. 01/03/2024 #90 0 rf  Future visit scheduled.   yes  Notes to clinic.  Labs are expired.    Requested Prescriptions  Pending Prescriptions Disp Refills   pravastatin  (PRAVACHOL ) 20 MG tablet 90 tablet 0    Sig: Take 1 tablet (20 mg total) by mouth daily.     Cardiovascular:  Antilipid - Statins Failed - 07/10/2024  3:16 PM      Failed - Valid encounter within last 12 months    Recent Outpatient Visits           1 year ago Essential hypertension   [place] [facility] Celestia Rosaline SQUIBB, NP   1 year ago Essential hypertension   [place] [facility] Celestia Rosaline SQUIBB, NP   1 year ago Type 2 diabetes mellitus without complication, without long-term current use of insulin  [facility])   [place] [facility] Celestia Rosaline SQUIBB, NP   2 years ago Essential hypertension   [place] [facility] Celestia Rosaline SQUIBB, NP   2 years ago Colon cancer screening    [facility] Rosaline SQUIBB, NP              Failed - Lipid Panel in normal range within the last 12 months    Cholesterol, Total  Date Value Ref Range Status  06/13/2023 144 100 - 199 mg/dL Final   LDL Chol Calc (NIH)  Date Value Ref Range Status  06/13/2023 76 0 - 99 mg/dL Final   HDL  Date Value Ref Range Status  06/13/2023 50 >39 mg/dL Final   Triglycerides  Date Value Ref Range Status  06/13/2023 95 0 - 149 mg/dL Final         Passed - Patient is not pregnant

## 2024-07-11 MED ORDER — PRAVASTATIN SODIUM 20 MG PO TABS: 20.0000 mg | ORAL_TABLET | Freq: Every day | ORAL | 0 refills | Status: DC

## 2024-07-20 LAB — SIGNATERA
SIGNATERA MTM READOUT: 0 MTM/ml
SIGNATERA TEST RESULT: NEGATIVE

## 2024-07-23 ENCOUNTER — Encounter (HOSPITAL_COMMUNITY): Payer: Self-pay | Admitting: Hematology

## 2024-07-24 ENCOUNTER — Other Ambulatory Visit: Payer: Self-pay

## 2024-07-25 ENCOUNTER — Telehealth: Payer: Self-pay | Admitting: Primary Care

## 2024-07-25 NOTE — Telephone Encounter (Signed)
 Contacted pt resch appt. Pt requesting refills on AmLODipine  , Metformin , Pravastatin 

## 2024-07-26 ENCOUNTER — Other Ambulatory Visit: Payer: Self-pay

## 2024-07-27 NOTE — Telephone Encounter (Addendum)
 Medications were refilled on 07/10/2024.  Patient canceled appt for 07/31/2024.  Next appt 08/23/2024.   Attempt to call patient to reschedule appt before 08/23/2024 to refill medication. Last OV was 06/2023.   Left message on voicemail to return call at schedule sooner appointment at office.

## 2024-07-30 ENCOUNTER — Ambulatory Visit (INDEPENDENT_AMBULATORY_CARE_PROVIDER_SITE_OTHER): Admitting: Primary Care

## 2024-07-31 ENCOUNTER — Ambulatory Visit: Admitting: Family Medicine

## 2024-08-05 ENCOUNTER — Other Ambulatory Visit (INDEPENDENT_AMBULATORY_CARE_PROVIDER_SITE_OTHER): Payer: Self-pay | Admitting: Primary Care

## 2024-08-05 DIAGNOSIS — E119 Type 2 diabetes mellitus without complications: Secondary | ICD-10-CM

## 2024-08-05 DIAGNOSIS — Z760 Encounter for issue of repeat prescription: Secondary | ICD-10-CM

## 2024-08-05 DIAGNOSIS — I10 Essential (primary) hypertension: Secondary | ICD-10-CM

## 2024-08-05 NOTE — Assessment & Plan Note (Signed)
 [id number], MMR normal, G2 -Patient presented with perforated sigmoid colon and a contained abscess required IR drainage tube placement. -He underwent left hemicolectomy and partial small bowel resection.  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  All surgical margins were negative. -Due to the high risk of recurrence given T4b lesion and bowel perforation, I recommend adjuvant chemotherapy FOLFOX or CAPOX for 6 months if he can tolerate. He opted CAPOX and started on 09/12/2023 and completed in April 2025. -Surveillance CT scan on January 09, 2024 and 05/07/2024 were negative for recurrence.

## 2024-08-06 ENCOUNTER — Inpatient Hospital Stay: Attending: Hematology

## 2024-08-06 ENCOUNTER — Inpatient Hospital Stay (HOSPITAL_BASED_OUTPATIENT_CLINIC_OR_DEPARTMENT_OTHER): Admitting: Hematology

## 2024-08-06 VITALS — BP 138/82 | HR 72 | Temp 98.4°F | Resp 18 | Ht 74.0 in | Wt 219.0 lb

## 2024-08-06 DIAGNOSIS — T451X5A Adverse effect of antineoplastic and immunosuppressive drugs, initial encounter: Secondary | ICD-10-CM | POA: Diagnosis not present

## 2024-08-06 DIAGNOSIS — C187 Malignant neoplasm of sigmoid colon: Secondary | ICD-10-CM | POA: Insufficient documentation

## 2024-08-06 DIAGNOSIS — G620 Drug-induced polyneuropathy: Secondary | ICD-10-CM | POA: Insufficient documentation

## 2024-08-06 DIAGNOSIS — Z79899 Other long term (current) drug therapy: Secondary | ICD-10-CM | POA: Diagnosis not present

## 2024-08-06 DIAGNOSIS — D500 Iron deficiency anemia secondary to blood loss (chronic): Secondary | ICD-10-CM

## 2024-08-06 LAB — CBC WITH DIFFERENTIAL/PLATELET
Abs Immature Granulocytes: 0.04 K/uL (ref 0.00–0.07)
Basophils Absolute: 0 K/uL (ref 0.0–0.1)
Basophils Relative: 0 %
Eosinophils Absolute: 0.1 K/uL (ref 0.0–0.5)
Eosinophils Relative: 1 %
HCT: 37.2 % — ABNORMAL LOW (ref 39.0–52.0)
Hemoglobin: 13.6 g/dL (ref 13.0–17.0)
Immature Granulocytes: 1 %
Lymphocytes Relative: 27 %
Lymphs Abs: 1.5 K/uL (ref 0.7–4.0)
MCH: 30.6 pg (ref 26.0–34.0)
MCHC: 36.6 g/dL — ABNORMAL HIGH (ref 30.0–36.0)
MCV: 83.6 fL (ref 80.0–100.0)
Monocytes Absolute: 0.4 K/uL (ref 0.1–1.0)
Monocytes Relative: 8 %
Neutro Abs: 3.5 K/uL (ref 1.7–7.7)
Neutrophils Relative %: 63 %
Platelets: 151 K/uL (ref 150–400)
RBC: 4.45 MIL/uL (ref 4.22–5.81)
RDW: 13.6 % (ref 11.5–15.5)
WBC: 5.6 K/uL (ref 4.0–10.5)
nRBC: 0 % (ref 0.0–0.2)

## 2024-08-06 LAB — COMPREHENSIVE METABOLIC PANEL WITH GFR
ALT: 18 U/L (ref 0–44)
AST: 21 U/L (ref 15–41)
Albumin: 4.2 g/dL (ref 3.5–5.0)
Alkaline Phosphatase: 72 U/L (ref 38–126)
Anion gap: 4 — ABNORMAL LOW (ref 5–15)
BUN: 11 mg/dL (ref 8–23)
CO2: 28 mmol/L (ref 22–32)
Calcium: 9.8 mg/dL (ref 8.9–10.3)
Chloride: 107 mmol/L (ref 98–111)
Creatinine, Ser: 0.99 mg/dL (ref 0.61–1.24)
GFR, Estimated: >60 mL/min (ref >60)
Glucose, Bld: 128 mg/dL — ABNORMAL HIGH (ref 70–99)
Potassium: 3.7 mmol/L (ref 3.5–5.1)
Sodium: 139 mmol/L (ref 135–145)
Total Bilirubin: 1.4 mg/dL — ABNORMAL HIGH (ref 0.0–1.2)
Total Protein: 7.6 g/dL (ref 6.5–8.1)

## 2024-08-06 LAB — FERRITIN: Ferritin: 43 ng/mL (ref 24–336)

## 2024-08-06 LAB — CEA (ACCESS): CEA (CHCC): 3.43 ng/mL (ref 0.00–5.00)

## 2024-08-06 MED ORDER — GABAPENTIN 100 MG PO CAPS: ORAL_CAPSULE | ORAL | 5 refills | Status: AC

## 2024-08-06 NOTE — Progress Notes (Signed)
 [facility]   [phone] [phone]   Clinic Follow up Note   Patient Care Team: Celestia Rosaline SQUIBB, NP as PCP - General (Internal Medicine) Lanny Callander, MD as Consulting Physician (Hematology and Oncology)  Date of Service:  08/06/2024  CHIEF COMPLAINT: f/u of colon cancer  CURRENT THERAPY:  Cancer surveillance  Oncology History   Cancer of sigmoid colon (HCC) pT4bN0M0, MMR normal, G2 -Patient presented with perforated sigmoid colon and a contained abscess required IR drainage tube placement. -He underwent left hemicolectomy and partial small bowel resection.  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  All surgical margins were negative. -Due to the high risk of recurrence given T4b lesion and bowel perforation, I recommend adjuvant chemotherapy FOLFOX or CAPOX for 6 months if he can tolerate. He opted CAPOX and started on 09/12/2023 and completed in April 2025. -Surveillance CT scan on January 09, 2024 and 05/07/2024 were negative for recurrence.  Assessment & Plan Colon cancer, status post chemotherapy, under surveillance Status post completion of chemotherapy in June 2025. Recent CT scan in July 2025 showed no signs of recurrence. Blood counts and Signatera test in September 2025 were normal and negative for recurrence, respectively. - Continue surveillance with follow-up every three months. - Order CT scan to be done one week before the next follow-up appointment in January. - Schedule lab work including Signatera test to be done one week before the next follow-up appointment in January. - Coordinate with Dr. Federico or his partners for a colonoscopy in November, as it is due one year post-surgery.  Chemotherapy-induced peripheral neuropathy Experiencing peripheral neuropathy, likely secondary to chemotherapy. Symptoms have improved with medication, but he  stopped taking it and now requires a refill. - Prescribe medication for neuropathy with three refills, instructing to take two in the morning and two in the evening. - Advise gradual reduction of medication to one in the morning and one in the evening if symptoms improve, with the goal of potentially discontinuing in six months to a year.  Plan - He is clinically doing well, no concern for recurrence - Continue cancer surveillance - Follow-up in 3 months with lab and surveillance CT scan 1 week before  SUMMARY OF ONCOLOGIC HISTORY: Oncology History  Cancer of sigmoid colon (HCC)  05/21/2023 Imaging   CT ABDOMEN PELVIS W CONTRAST   IMPRESSION: 1. Sigmoid colon bowel wall thickening may reflect a colonic mass or focal diverticulitis. Multiple rim enhancing fluid collections in the abdomen or pelvis likely reflect abscesses and suggest bowel perforation due to the colonic mass/focal diverticulitis. 2. Multiple loops of dilated small bowel with a possible transition to decompressed small bowel in the lower right abdomen. This may reflect ileus or small-bowel obstruction and is likely related to the multiple abdominal abscesses/intraperitoneal inflammatory process.   06/03/2023 Imaging   CT ABDOMEN PELVIS W CONTRAST    IMPRESSION: 1. Interval placement of midline anterior and left transgluteal approach pelvic drains. The abscesses have resolved. 2. Masslike thickening of the sigmoid colon remains highly suspicious for primary neoplasm. 3. Soft tissue nodular implant in the right lower quadrant measuring 1.5 cm is suspicious for metastatic implant.     06/08/2023 Pathology Results   Diagnosis 1. Colon, polyp(s), transverse and ascending (3) - TUBULAR ADENOMA, FRAGMENTS. 2. Colon, polyp(s), sigmoid and descending (2) - TUBULOVILLOUS/TUBULAR ADENOMA, FRAGMENTS. 3. Sigmoid Colon Biopsy, mass - SUPERFICIAL FRAGMENTS OF TUBULOVILLOUS ADENOMA WITH HIGH-GRADE  DYSPLASIA. NOTE: IT IS  NOTED THAT THIS IS SUBMITTED AS A MASS. WHILE THERE IS FOCAL SPLAYING OF SMOOTH MUSCLE DEFINITIVE EVIDENCE OF INVASION INCLUDING DESMOPLASIA OR THE PRESENCE OF SUBMUCOSAL TISSUE IS NOT PRESENT IN THESE SUPERFICIAL BIOPSIES. CLINICAL/ENDOSCOPIC CORRELATION NECESSARY. 4. Rectum, polyp(s), (2) - TUBULOVILLOUS ADENOMA, FRAGMENTS.   06/30/2023 PET scan   NM PET IMAGE initial (PI) SKULL BASE TO THIGH  IMPRESSION: 1. Hypermetabolic mass in the proximal sigmoid colon consistent with primary colorectal carcinoma. 2. No evidence of metastatic adenopathy or peritoneal metastasis. No liver metastasis or distant metastasis   07/10/2023 Initial Diagnosis   Cancer of sigmoid colon [facility])    Imaging     08/15/2023 Cancer Staging   Staging form: Colon and Rectum, AJCC 8th Edition - Pathologic stage from 08/15/2023: Stage IIC (pT4b, pN0, cM0) - Signed by Lanny Callander, MD on 08/30/2023 Total positive nodes: 0 Histologic grading system: 4 grade system Histologic grade (G): G2 Residual tumor (R): R0 - None   09/12/2023 -  Chemotherapy   Patient is on Treatment Plan : COLORECTAL Xelox (Capeox)(130/850) q21d        Discussed the use of AI scribe software for clinical note transcription with the patient, who gave verbal consent to proceed.  History of Present Illness Duane [name] is a 63 year old male with colon cancer who presents for follow-up.  He was diagnosed with colon cancer in late 2024 and completed chemotherapy in June 2025. His last CT scan in July 2025 showed no signs of recurrence. He is due for a colonoscopy around October or November 2025. Neuropathy symptoms have improved with medication. He requests a refill for a medication he previously stopped because his symptoms had improved. He takes two pills in the morning and two in the evening, totaling 120 pills a month.     All other systems were reviewed with the patient and are negative.  MEDICAL HISTORY:  Past Medical History:   Diagnosis Date   Colon cancer (HCC)    Diabetes mellitus without complication (HCC) type 2    GERD (gastroesophageal reflux disease)    Hypertension     SURGICAL HISTORY: Past Surgical History:  Procedure Laterality Date   COLONOSCOPY     FLEXIBLE SIGMOIDOSCOPY N/A 08/15/2023   Procedure: FLEXIBLE SIGMOIDOSCOPY;  Surgeon: Teresa Lonni HERO, MD;  Location: [facility];  Service: General;  Laterality: N/A;   IR RADIOLOGIST EVAL & MGMT  06/03/2023   LAPAROSCOPY N/A 08/15/2023   Procedure: LAPAROSCOPY DIAGNOSTIC;  Surgeon: Teresa Lonni HERO, MD;  Location: [facility];  Service: General;  Laterality: N/A;   PORTACATH PLACEMENT Right 09/09/2023   Procedure: INSERTION PORT-A-CATH ULTRASOUND FLUOROSCOPY;  Surgeon: Teresa Lonni HERO, MD;  Location: [facility];  Service: General;  Laterality: Right;   XI ROBOTIC ASSISTED LOWER ANTERIOR RESECTION N/A 08/15/2023   Procedure: XI ROBOTIC ASSISTED LOWER ANTERIOR RESECTION, SMALL BOWEL RESECTION, LYSIS OF ADHESIONS, AND INTRAOPERATIVE ASSESSMENT OF PERFUSION USING ICG DYE;  Surgeon: Teresa Lonni HERO, MD;  Location: [facility];  Service: General;  Laterality: N/A;    I have reviewed the social history and family history with the patient and they are unchanged from previous note.  ALLERGIES:  is allergic to codeine.  MEDICATIONS:  Current Outpatient Medications  Medication Sig Dispense Refill   amLODipine  (NORVASC ) 10 MG tablet Take 1 tablet (10 mg total) by mouth daily. 30 tablet 0   diclofenac  Sodium (VOLTAREN ) 1 % GEL Research Patient: Apply 0.5 grams (1 fingertip) to each hand  and each foot twice daily for up to 12 weeks 400 g 0   gabapentin  (NEURONTIN ) 100 MG capsule TAKE 2 CAPSULE BY MOUTH TWICE DAILY 120 capsule 5   lidocaine -prilocaine  (EMLA ) cream Apply to affected area once 30 g 3   metFORMIN  (GLUCOPHAGE ) 500 MG tablet Take 1 tablet (500 mg total) by mouth 2 (two) times daily with a meal. 60 tablet 0   ondansetron  (ZOFRAN )  8 MG tablet Take 1 tablet (8 mg total) by mouth every 8 (eight) hours as needed for nausea or vomiting. Start on the third day after chemotherapy. 30 tablet 1   pravastatin  (PRAVACHOL ) 20 MG tablet Take 1 tablet (20 mg total) by mouth daily. 90 tablet 0   prochlorperazine  (COMPAZINE ) 10 MG tablet Take 1 tablet (10 mg total) by mouth every 6 (six) hours as needed for nausea or vomiting. 30 tablet 1   No current facility-administered medications for this visit.    PHYSICAL EXAMINATION: ECOG PERFORMANCE STATUS: 0 - Asymptomatic  Vitals:   08/06/24 1200  BP: 138/82  Pulse: 72  Resp: 18  Temp: 98.4 F (36.9 C)  SpO2: 100%   Wt Readings from Last 3 Encounters:  08/06/24 219 lb (99.3 kg)  05/14/24 213 lb (96.6 kg)  04/16/24 212 lb 11.2 oz (96.5 kg)     GENERAL:alert, no distress and comfortable SKIN: skin color, texture, turgor are normal, no rashes or significant lesions EYES: normal, Conjunctiva are pink and non-injected, sclera clear NECK: supple, thyroid normal size, non-tender, without nodularity LYMPH:  no palpable lymphadenopathy in the cervical, axillary  LUNGS: clear to auscultation and percussion with normal breathing effort HEART: regular rate & rhythm and no murmurs and no lower extremity edema ABDOMEN:abdomen soft, non-tender and normal bowel sounds Musculoskeletal:no cyanosis of digits and no clubbing  NEURO: alert & oriented x 3 with fluent speech, no focal motor/sensory deficits  Physical Exam ABDOMEN: Abdomen non-tender.  LABORATORY DATA:  I have reviewed the data as listed    Latest Ref Rng & Units 08/06/2024   12:35 PM 06/25/2024   12:56 PM 05/14/2024   12:58 PM  CBC  WBC 4.0 - 10.5 K/uL 5.6  5.4  5.5   Hemoglobin 13.0 - 17.0 g/dL 86.3  86.9  86.8   Hematocrit 39.0 - 52.0 % 37.2  36.8  36.0   Platelets 150 - 400 K/uL 151  135  136         Latest Ref Rng & Units 08/06/2024   12:35 PM 06/25/2024   12:56 PM 05/14/2024   12:58 PM  CMP  Glucose 70 - 99  mg/dL 871  838  892   BUN 8 - 23 mg/dL 11  12  15    Creatinine 0.61 - 1.24 mg/dL 9.00  9.11  8.99   Sodium 135 - 145 mmol/L 139  139  140   Potassium 3.5 - 5.1 mmol/L 3.7  3.9  3.8   Chloride 98 - 111 mmol/L 107  107  108   CO2 22 - 32 mmol/L 28  27  28    Calcium 8.9 - 10.3 mg/dL 9.8  9.4  9.6   Total Protein 6.5 - 8.1 g/dL 7.6  7.1  7.5   Total Bilirubin 0.0 - 1.2 mg/dL 1.4  1.1  1.3   Alkaline Phos 38 - 126 U/L 72  77  91   AST 15 - 41 U/L 21  23  26    ALT 0 - 44 U/L 18  21  22  RADIOGRAPHIC STUDIES: I have personally reviewed the radiological images as listed and agreed with the findings in the report. No results found.    Orders Placed This Encounter  Procedures   CT CHEST ABDOMEN PELVIS W CONTRAST    Standing Status:   Future    Expected Date:   10/31/2024    Expiration Date:   08/06/2025    If indicated for the ordered procedure, I authorize the administration of contrast media per Radiology protocol:   Yes    Does the patient have a contrast media/X-ray dye allergy?:   No    Preferred imaging location?:   [facility]    If indicated for the ordered procedure, I authorize the administration of oral contrast media per Radiology protocol:   Yes   All questions were answered. The patient knows to call the clinic with any problems, questions or concerns. No barriers to learning was detected. The total time spent in the appointment was 25 minutes, including review of chart and various tests results, discussions about plan of care and coordination of care plan     Onita Mattock, MD 08/06/2024

## 2024-08-07 ENCOUNTER — Other Ambulatory Visit: Payer: Self-pay

## 2024-08-07 NOTE — Telephone Encounter (Signed)
 Requested Prescriptions  Pending Prescriptions Disp Refills   metFORMIN  (GLUCOPHAGE ) 500 MG tablet [Pharmacy Med Name: metFORMIN  HCl 500 MG Oral Tablet] 180 tablet 0    Sig: TAKE 1 TABLET BY MOUTH TWICE DAILY WITH A MEAL     Endocrinology:  Diabetes - Biguanides Failed - 08/07/2024 10:03 AM      Failed - HBA1C is between 0 and 7.9 and within 180 days    HbA1c, POC (controlled diabetic range)  Date Value Ref Range Status  09/07/2022 6.3 0.0 - 7.0 % Final   Hgb A1c MFr Bld  Date Value Ref Range Status  08/12/2023 5.5 4.8 - 5.6 % Final    Comment:    (NOTE) Pre diabetes:          5.7%-6.4%  Diabetes:              >6.4%  Glycemic control for   <7.0% adults with diabetes          Failed - B12 Level in normal range and within 720 days    No results found for: VITAMINB12       Failed - Valid encounter within last 6 months    Recent Outpatient Visits           1 year ago Essential hypertension   [place] [facility] Celestia Rosaline SQUIBB, NP   1 year ago Essential hypertension   [place] [facility] Celestia Rosaline SQUIBB, NP   1 year ago Type 2 diabetes mellitus without complication, without long-term current use of insulin  (HCC)   [place] [facility] Celestia Rosaline SQUIBB, NP   2 years ago Essential hypertension   [place] [facility] Celestia Rosaline SQUIBB, NP   2 years ago Colon cancer screening   [place] [facility] Celestia Rosaline SQUIBB, NP              Failed - CBC within normal limits and completed in the last 12 months    WBC  Date Value Ref Range Status  08/06/2024 5.6 4.0 - 10.5 K/uL Final   RBC  Date Value Ref Range Status  08/06/2024 4.45 4.22 - 5.81 MIL/uL Final   Hemoglobin  Date Value Ref Range Status  08/06/2024 13.6 13.0 - 17.0 g/dL Final  93/83/7974 87.3 (L) 13.0 - 17.0 g/dL Final  97/77/7975 86.2 13.0 - 17.7 g/dL Final   HCT  Date Value Ref Range Status   08/06/2024 37.2 (L) 39.0 - 52.0 % Final   Hematocrit  Date Value Ref Range Status  12/23/2022 42.1 37.5 - 51.0 % Final   MCHC  Date Value Ref Range Status  08/06/2024 36.6 (H) 30.0 - 36.0 g/dL Final   [facility]  Date Value Ref Range Status  08/06/2024 30.6 26.0 - 34.0 pg Final   MCV  Date Value Ref Range Status  08/06/2024 83.6 80.0 - 100.0 fL Final  12/23/2022 86 79 - 97 fL Final   No results found for: PLTCOUNTKUC, LABPLAT, POCPLA RDW  Date Value Ref Range Status  08/06/2024 13.6 11.5 - 15.5 % Final  12/23/2022 16.3 (H) 11.6 - 15.4 % Final         Passed - Cr in normal range and within 360 days    Creatinine  Date Value Ref Range Status  04/16/2024 0.93 0.61 - 1.24 mg/dL Final   Creatinine, Ser  Date Value Ref Range Status  08/06/2024 0.99 0.61 - 1.24 mg/dL Final  Passed - eGFR in normal range and within 360 days    GFR, Estimated  Date Value Ref Range Status  08/06/2024 >60 >60 mL/min Final    Comment:    (NOTE) Calculated using the CKD-EPI Creatinine Equation (2021)   04/16/2024 >60 >60 mL/min Final    Comment:    (NOTE) Calculated using the CKD-EPI Creatinine Equation (2021)    eGFR  Date Value Ref Range Status  06/13/2023 97 >59 mL/min/1.73 Final          amLODipine  (NORVASC ) 10 MG tablet [Pharmacy Med Name: amLODIPine  Besylate 10 MG Oral Tablet] 90 tablet 0    Sig: Take 1 tablet by mouth once daily     Cardiovascular: Calcium Channel Blockers 2 Failed - 08/07/2024 10:03 AM      Failed - Valid encounter within last 6 months    Recent Outpatient Visits           1 year ago Essential hypertension   [place] [facility] Celestia Rosaline SQUIBB, NP   1 year ago Essential hypertension   [place] [facility] Celestia Rosaline SQUIBB, NP   1 year ago Type 2 diabetes mellitus without complication, without long-term current use of insulin  [facility])   [place] [facility] Celestia Rosaline SQUIBB, NP    2 years ago Essential hypertension   [place] [facility] Celestia Rosaline SQUIBB, NP   2 years ago Colon cancer screening   [place] [facility] Celestia Rosaline SQUIBB, NP              Passed - Last BP in normal range    BP Readings from Last 1 Encounters:  08/06/24 138/82         Passed - Last Heart Rate in normal range    Pulse Readings from Last 1 Encounters:  08/06/24 72

## 2024-08-09 ENCOUNTER — Other Ambulatory Visit: Payer: Self-pay

## 2024-08-10 ENCOUNTER — Telehealth: Payer: Self-pay | Admitting: Hematology

## 2024-08-10 NOTE — Telephone Encounter (Signed)
 I LVM informing Duane [name] of his Lab appointment scheduled for 11/5 at 12:30pm. I asked that he return my call if he needs to reschedule.

## 2024-08-23 ENCOUNTER — Ambulatory Visit (INDEPENDENT_AMBULATORY_CARE_PROVIDER_SITE_OTHER): Admitting: Primary Care

## 2024-08-23 ENCOUNTER — Encounter (INDEPENDENT_AMBULATORY_CARE_PROVIDER_SITE_OTHER): Payer: Self-pay | Admitting: Primary Care

## 2024-08-23 VITALS — BP 134/78 | HR 73 | Resp 16 | Wt 217.4 lb

## 2024-08-23 DIAGNOSIS — E119 Type 2 diabetes mellitus without complications: Secondary | ICD-10-CM | POA: Diagnosis not present

## 2024-08-23 DIAGNOSIS — I10 Essential (primary) hypertension: Secondary | ICD-10-CM | POA: Diagnosis not present

## 2024-08-23 DIAGNOSIS — E785 Hyperlipidemia, unspecified: Secondary | ICD-10-CM | POA: Diagnosis not present

## 2024-08-23 NOTE — Progress Notes (Signed)
 Subjective:  Patient ID: Duane [name], male    DOB: [birth year]  Age: 63 y.o. MRN: [id number]  CC: Diabetes and Hypertension   Duane [name] presents for follow-up of diabetes. Patient does not check blood sugar at home Diabetes  Hypertension    Compliant with meds - Yes Checking CBGs? No  Fasting avg -   Postprandial average -  Exercising regularly? - Yes Watching carbohydrate intake? - Yes Neuropathy ? - Yes (chemo)  Hypoglycemic events - No  - Recovers with :   Pertinent ROS:  Polyuria - Yes Polydipsia - No Vision problems - No  Medications as noted below. Taking them regularly without complication/adverse reaction being reported today.  Hypertension - Patient has No headache, No chest pain, No abdominal pain - No Nausea, No new weakness tingling or numbness, No Cough - shortness of breath/s  History Sheldon has a past medical history of Colon cancer (HCC), Diabetes mellitus without complication (HCC) type 2, GERD (gastroesophageal reflux disease), and Hypertension.   He has a past surgical history that includes IR Radiologist Eval & Mgmt (06/03/2023); Colonoscopy; XI robotic assisted lower anterior resection (N/A, 08/15/2023); laparoscopy (N/A, 08/15/2023); Flexible sigmoidoscopy (N/A, 08/15/2023); and Portacath placement (Right, 09/09/2023).   His family history is not on file.He reports that he has quit smoking. His smoking use included cigarettes. He started smoking about 50 years ago. He has a 50.8 pack-year smoking history. He has never used smokeless tobacco. He reports that he does not currently use alcohol. He reports that he does not use drugs.  Current Outpatient Medications on File Prior to Visit  Medication Sig Dispense Refill   amLODipine  (NORVASC ) 10 MG tablet Take 1 tablet by mouth once daily 90 tablet 0   diclofenac  Sodium (VOLTAREN ) 1 % GEL Research Patient: Apply 0.5 grams (1 fingertip) to each hand and each foot twice daily for up to 12 weeks 400  g 0   gabapentin  (NEURONTIN ) 100 MG capsule TAKE 2 CAPSULE BY MOUTH TWICE DAILY 120 capsule 5   lidocaine -prilocaine  (EMLA ) cream Apply to affected area once 30 g 3   metFORMIN  (GLUCOPHAGE ) 500 MG tablet TAKE 1 TABLET BY MOUTH TWICE DAILY WITH A MEAL 180 tablet 0   ondansetron  (ZOFRAN ) 8 MG tablet Take 1 tablet (8 mg total) by mouth every 8 (eight) hours as needed for nausea or vomiting. Start on the third day after chemotherapy. 30 tablet 1   pravastatin  (PRAVACHOL ) 20 MG tablet Take 1 tablet (20 mg total) by mouth daily. 90 tablet 0   prochlorperazine  (COMPAZINE ) 10 MG tablet Take 1 tablet (10 mg total) by mouth every 6 (six) hours as needed for nausea or vomiting. 30 tablet 1   No current facility-administered medications on file prior to visit.    Review of Systems Comprehensive ROS Pertinent positive and negative noted in HPI   Objective:  BP 134/78 (Cuff Size: Normal)   Pulse 73   Resp 16   Wt 217 lb 6.4 oz (98.6 kg)   SpO2 98%   BMI 27.91 kg/m   BP Readings from Last 3 Encounters:  08/23/24 134/78  08/06/24 138/82  05/14/24 120/66    Wt Readings from Last 3 Encounters:  08/23/24 217 lb 6.4 oz (98.6 kg)  08/06/24 219 lb (99.3 kg)  05/14/24 213 lb (96.6 kg)    Physical Exam Vitals reviewed.  HENT:     Head: Normocephalic and atraumatic.     Right Ear: Tympanic membrane, ear canal and external ear normal.  Left Ear: Tympanic membrane, ear canal and external ear normal.     Nose: Nose normal.  Eyes:     Extraocular Movements: Extraocular movements intact.     Pupils: Pupils are equal, round, and reactive to light.  Cardiovascular:     Rate and Rhythm: Normal rate and regular rhythm.  Pulmonary:     Effort: Pulmonary effort is normal.     Breath sounds: Normal breath sounds.  Abdominal:     General: Bowel sounds are normal. There is distension.     Palpations: Abdomen is soft.  Musculoskeletal:        General: Normal range of motion.     Cervical back:  Normal range of motion.  Skin:    General: Skin is warm and dry.  Neurological:     Mental Status: He is oriented to person, place, and time.  Psychiatric:        Mood and Affect: Mood normal.        Behavior: Behavior normal.        Thought Content: Thought content normal.        Judgment: Judgment normal.     Lab Results  Component Value Date   HGBA1C 5.5 08/12/2023   HGBA1C 6.4 (H) 12/23/2022   HGBA1C 6.3 09/07/2022    Lab Results  Component Value Date   WBC 5.6 08/06/2024   HGB 13.6 08/06/2024   HCT 37.2 (L) 08/06/2024   PLT 151 08/06/2024   GLUCOSE 128 (H) 08/06/2024   CHOL 144 06/13/2023   TRIG 95 06/13/2023   HDL 50 06/13/2023   LDLCALC 76 06/13/2023   ALT 18 08/06/2024   AST 21 08/06/2024   NA 139 08/06/2024   K 3.7 08/06/2024   CL 107 08/06/2024   CREATININE 0.99 08/06/2024   BUN 11 08/06/2024   CO2 28 08/06/2024   INR 1.2 05/22/2023   HGBA1C 5.5 08/12/2023    Title   Diabetic Foot Exam - detailed    Semmes-Weinstein Monofilament Test + means has sensation and - means no sensation      Image components are not supported.   Image components are not supported. Image components are not supported.  [place] Comments      Assessment & Plan:  [name] was seen today for diabetes and hypertension.  Diagnoses and all orders for this visit:  Type 2 diabetes mellitus without complication, without long-term current use of insulin  (HCC) - educated on lifestyle modifications, including but not limited to diet choices and adding exercise to daily routine.   -     Hemoglobin A1c -     Lipid panel -     Microalbumin / creatinine urine ratio  Essential hypertension Well controlled BP goal - < 140/90 DIET: Limit salt intake, read nutrition labels to check salt content, limit fried and high fatty foods  Avoid using multisymptom OTC cold preparations that generally contain sudafed which can rise BP. Consult with pharmacist on best cold relief  products to use for persons with HTN EXERCISE Discussed incorporating exercise such as walking - 30 minutes most days of the week and can do in 10 minute intervals    Dyslipidemia -     Lipid panel     Follow-up:  Return in about 6 months (around 02/21/2025) for fasting labs.  The above assessment and management plan was discussed with the patient. The patient verbalized understanding of and has agreed to the management plan. Patient is aware to call the clinic if  symptoms fail to improve or worsen. Patient is aware when to return to the clinic for a follow-up visit. Patient educated on when it is appropriate to go to the emergency department.   Rosaline Bohr, NP-C

## 2024-08-24 LAB — LIPID PANEL
Chol/HDL Ratio: 2.3 ratio (ref 0.0–5.0)
Cholesterol, Total: 131 mg/dL (ref 100–199)
HDL: 56 mg/dL (ref >39)
LDL Chol Calc (NIH): 61 mg/dL (ref 0–99)
Triglycerides: 71 mg/dL (ref 0–149)
VLDL Cholesterol Cal: 14 mg/dL (ref 5–40)

## 2024-08-24 LAB — HEMOGLOBIN A1C
Est. average glucose Bld gHb Est-mCnc: 131 mg/dL
Hgb A1c MFr Bld: 6.2 % — ABNORMAL HIGH (ref 4.8–5.6)

## 2024-08-27 ENCOUNTER — Ambulatory Visit (INDEPENDENT_AMBULATORY_CARE_PROVIDER_SITE_OTHER): Payer: Self-pay | Admitting: Primary Care

## 2024-09-04 ENCOUNTER — Other Ambulatory Visit: Payer: Self-pay

## 2024-09-05 ENCOUNTER — Inpatient Hospital Stay

## 2024-09-05 ENCOUNTER — Inpatient Hospital Stay: Attending: Hematology

## 2024-09-05 DIAGNOSIS — C187 Malignant neoplasm of sigmoid colon: Secondary | ICD-10-CM | POA: Insufficient documentation

## 2024-09-05 LAB — CBC WITH DIFFERENTIAL/PLATELET
Abs Immature Granulocytes: 0.04 K/uL (ref 0.00–0.07)
Basophils Absolute: 0 K/uL (ref 0.0–0.1)
Basophils Relative: 1 %
Eosinophils Absolute: 0.1 K/uL (ref 0.0–0.5)
Eosinophils Relative: 1 %
HCT: 37.8 % — ABNORMAL LOW (ref 39.0–52.0)
Hemoglobin: 13.4 g/dL (ref 13.0–17.0)
Immature Granulocytes: 1 %
Lymphocytes Relative: 28 %
Lymphs Abs: 1.6 K/uL (ref 0.7–4.0)
MCH: 30 pg (ref 26.0–34.0)
MCHC: 35.4 g/dL (ref 30.0–36.0)
MCV: 84.6 fL (ref 80.0–100.0)
Monocytes Absolute: 0.4 K/uL (ref 0.1–1.0)
Monocytes Relative: 7 %
Neutro Abs: 3.5 K/uL (ref 1.7–7.7)
Neutrophils Relative %: 62 %
Platelets: 143 K/uL — ABNORMAL LOW (ref 150–400)
RBC: 4.47 MIL/uL (ref 4.22–5.81)
RDW: 13.5 % (ref 11.5–15.5)
WBC: 5.6 K/uL (ref 4.0–10.5)
nRBC: 0 % (ref 0.0–0.2)

## 2024-09-05 LAB — COMPREHENSIVE METABOLIC PANEL WITH GFR
ALT: 20 U/L (ref 0–44)
AST: 20 U/L (ref 15–41)
Albumin: 3.9 g/dL (ref 3.5–5.0)
Alkaline Phosphatase: 67 U/L (ref 38–126)
Anion gap: 4 — ABNORMAL LOW (ref 5–15)
BUN: 11 mg/dL (ref 8–23)
CO2: 27 mmol/L (ref 22–32)
Calcium: 9.6 mg/dL (ref 8.9–10.3)
Chloride: 107 mmol/L (ref 98–111)
Creatinine, Ser: 1.02 mg/dL (ref 0.61–1.24)
GFR, Estimated: >60 mL/min (ref >60)
Glucose, Bld: 237 mg/dL — ABNORMAL HIGH (ref 70–99)
Potassium: 3.7 mmol/L (ref 3.5–5.1)
Sodium: 138 mmol/L (ref 135–145)
Total Bilirubin: 1.3 mg/dL — ABNORMAL HIGH (ref 0.0–1.2)
Total Protein: 7.3 g/dL (ref 6.5–8.1)

## 2024-09-05 LAB — FERRITIN: Ferritin: 50 ng/mL (ref 24–336)

## 2024-09-05 LAB — GENETIC SCREENING ORDER

## 2024-09-05 LAB — CEA (ACCESS): CEA (CHCC): 3.64 ng/mL (ref 0.00–5.00)

## 2024-09-17 LAB — SIGNATERA
SIGNATERA MTM READOUT: 0.03 MTM/ml — AB
SIGNATERA TEST RESULT: POSITIVE — AB

## 2024-09-21 ENCOUNTER — Ambulatory Visit: Payer: Self-pay | Admitting: Hematology

## 2024-09-21 DIAGNOSIS — C187 Malignant neoplasm of sigmoid colon: Secondary | ICD-10-CM

## 2024-09-21 NOTE — Telephone Encounter (Signed)
 Spoke with pt via telephone regarding recent Signatera results.  Stated Dr Lanny has reviewed his Signatera and the results are positive.  Stated Dr Lanny would like to move the pt's CT Scan up to the next 1 to 2 wks due to these results and have an OV 3-4 days after the CT Scan.  Stated someone from [facility] will be contacting the pt to get him scheduled.  Pt verbalized understanding and had no further questions at this time.

## 2024-09-28 ENCOUNTER — Other Ambulatory Visit: Payer: Self-pay | Admitting: Hematology

## 2024-09-28 DIAGNOSIS — C187 Malignant neoplasm of sigmoid colon: Secondary | ICD-10-CM

## 2024-10-01 ENCOUNTER — Other Ambulatory Visit: Payer: Self-pay

## 2024-10-02 ENCOUNTER — Ambulatory Visit (HOSPITAL_COMMUNITY)
Admission: RE | Admit: 2024-10-02 | Discharge: 2024-10-02 | Disposition: A | Discharge status: Home / Self Care | Source: Ambulatory Visit | Attending: Hematology | Admitting: Hematology

## 2024-10-02 DIAGNOSIS — C187 Malignant neoplasm of sigmoid colon: Secondary | ICD-10-CM | POA: Insufficient documentation

## 2024-10-02 MED ORDER — HEPARIN SOD (PORK) LOCK FLUSH 100 UNIT/ML IV SOLN
500.0000 [IU] | Freq: Once | INTRAVENOUS | Status: AC
  Administered 2024-10-02: 500 [IU] via INTRAVENOUS

## 2024-10-02 MED ORDER — IOHEXOL 9 MG/ML PO SOLN
500.0000 mL | ORAL | Status: AC
  Administered 2024-10-02 (×2): 500 mL via ORAL

## 2024-10-02 MED ORDER — IOHEXOL 300 MG/ML  SOLN
100.0000 mL | Freq: Once | INTRAMUSCULAR | Status: AC | PRN
  Administered 2024-10-02: 100 mL via INTRAVENOUS

## 2024-10-04 ENCOUNTER — Other Ambulatory Visit (INDEPENDENT_AMBULATORY_CARE_PROVIDER_SITE_OTHER): Payer: Self-pay | Admitting: Primary Care

## 2024-10-07 NOTE — Assessment & Plan Note (Addendum)
 pT4bN0M0, MMR normal, G2 -Patient presented with perforated sigmoid colon and a contained abscess required IR drainage tube placement. -He underwent left hemicolectomy and partial small bowel resection.  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  All surgical margins were negative. -Due to the high risk of recurrence given T4b lesion and bowel perforation, I recommend adjuvant chemotherapy FOLFOX or CAPOX for 6 months if he can tolerate. He opted CAPOX and started on 09/12/2023 and completed in April 2025. -Surveillance CT scan on January 09, 2024,  05/07/2024 and 10/02/2024 were negative for recurrence.

## 2024-10-08 ENCOUNTER — Inpatient Hospital Stay: Attending: Hematology | Admitting: Hematology

## 2024-10-08 DIAGNOSIS — C187 Malignant neoplasm of sigmoid colon: Secondary | ICD-10-CM | POA: Insufficient documentation

## 2024-10-08 DIAGNOSIS — Z79899 Other long term (current) drug therapy: Secondary | ICD-10-CM | POA: Insufficient documentation

## 2024-10-08 DIAGNOSIS — Z87891 Personal history of nicotine dependence: Secondary | ICD-10-CM | POA: Insufficient documentation

## 2024-10-08 NOTE — Progress Notes (Signed)
 [facility]   Telephone:([phone]) [phone] Fax:(336) [phone]   Clinic Follow up Note   Patient Care Team: Celestia Rosaline SQUIBB, NP as PCP - General (Internal Medicine) Lanny Callander, MD as Consulting Physician (Hematology and Oncology) 10/08/2024  I connected with Nancyann Levander Sharps on 10/08/24 at  8:40 AM EST by telephone and verified that I am speaking with the correct person using two identifiers.   I discussed the limitations, risks, security and privacy concerns of performing an evaluation and management service by telephone and the availability of in person appointments. I also discussed with the patient that there may be a patient responsible charge related to this service. The patient expressed understanding and agreed to proceed.   Patient's location:  Home  Provider's location:  Office    CHIEF COMPLAINT: f/ colon cancer    CURRENT THERAPY: Cancer surveillance  Oncology history Cancer of sigmoid colon (HCC) pT4bN0M0, MMR normal, G2 -Patient presented with perforated sigmoid colon and a contained abscess required IR drainage tube placement. -He underwent left hemicolectomy and partial small bowel resection.  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  All surgical margins were negative. -Due to the high risk of recurrence given T4b lesion and bowel perforation, I recommend adjuvant chemotherapy FOLFOX or CAPOX for 6 months if he can tolerate. He opted CAPOX and started on 09/12/2023 and completed in April 2025. -Surveillance CT scan on January 09, 2024,  05/07/2024 and 10/02/2024 were negative for recurrence.  Assessment & Plan Colon cancer under surveillance for recurrence He remains asymptomatic with ECOG performance status 0 and no functional limitations. Recent Signatera blood test was positive, raising concern for early recurrence not detected on recent CT scan, which showed no  definitive evidence of disease. PET scan is indicated for further evaluation due to its higher sensitivity for lymph node and peritoneal disease. - Ordered PET scan to evaluate for early recurrence. - [employer] approval for PET scan. - Planned follow-up phone call after PET scan results are available. - Planned monthly Signatera testing; if results remain positive or increase, will repeat imaging in 2-3 months.  Peripheral neuropathy due to chemotherapy He has persistent neuropathy in the feet, described as numbness and tightness, attributed to prior chemotherapy. No limitations in hand function, ambulation, or self-care. He continues to function independently. - Provided gabapentin  refill with five refills and instructed him to contact pharmacy for refills.  Plan - I personally reviewed his CT scan images, which is negative for cancer recurrence - Due to the positive Signatera, I recommend a PET scan to be done in the next few weeks, I will call him with results.   SUMMARY OF ONCOLOGIC HISTORY: Oncology History  Cancer of sigmoid colon (HCC)  05/21/2023 Imaging   CT ABDOMEN PELVIS W CONTRAST   IMPRESSION: 1. Sigmoid colon bowel wall thickening may reflect a colonic mass or focal diverticulitis. Multiple rim enhancing fluid collections in the abdomen or pelvis likely reflect abscesses and suggest bowel perforation due to the colonic mass/focal diverticulitis. 2. Multiple loops of dilated small bowel with a possible transition to decompressed small bowel in the lower right abdomen. This may reflect ileus or small-bowel obstruction and is likely related to the multiple abdominal abscesses/intraperitoneal inflammatory process.   06/03/2023 Imaging   CT ABDOMEN PELVIS W CONTRAST    IMPRESSION: 1. Interval placement of midline anterior and left transgluteal approach pelvic drains. The abscesses have resolved. 2.  Masslike thickening of the sigmoid colon remains  highly suspicious for primary neoplasm. 3. Soft tissue nodular implant in the right lower quadrant measuring 1.5 cm is suspicious for metastatic implant.     06/08/2023 Pathology Results   Diagnosis 1. Colon, polyp(s), transverse and ascending (3) - TUBULAR ADENOMA, FRAGMENTS. 2. Colon, polyp(s), sigmoid and descending (2) - TUBULOVILLOUS/TUBULAR ADENOMA, FRAGMENTS. 3. 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 NOTE: IT IS NOTED THAT THIS IS SUBMITTED AS A MASS. WHILE THERE IS FOCAL SPLAYING OF SMOOTH MUSCLE DEFINITIVE EVIDENCE OF INVASION INCLUDING DESMOPLASIA OR THE PRESENCE OF SUBMUCOSAL TISSUE IS NOT PRESENT IN THESE SUPERFICIAL BIOPSIES. CLINICAL/ENDOSCOPIC CORRELATION NECESSARY. 4. Rectum, polyp(s), (2) - TUBULOVILLOUS ADENOMA, FRAGMENTS.   06/30/2023 PET scan   NM PET IMAGE initial (PI) SKULL BASE TO THIGH  IMPRESSION: 1. Hypermetabolic mass in the proximal sigmoid colon consistent with primary colorectal carcinoma. 2. No evidence of metastatic adenopathy or peritoneal metastasis. No liver metastasis or distant metastasis   07/10/2023 Initial Diagnosis   Cancer of sigmoid colon [facility])    Imaging     08/15/2023 Cancer Staging   Staging form: Colon and Rectum, AJCC 8th Edition - Pathologic stage from 08/15/2023: Stage IIC (pT4b, pN0, cM0) - Signed by Lanny Callander, MD on 08/30/2023 Total positive nodes: 0 Histologic grading system: 4 grade system Histologic grade (G): G2 Residual tumor (R): R0 - None   09/12/2023 -  Chemotherapy   Patient is on Treatment Plan : COLORECTAL Xelox (Capeox)(130/850) q21d       Discussed the use of AI scribe software for clinical note transcription with the patient, who gave verbal consent to proceed.  History of Present Illness Duane [name] is a 63 year old male with anal cancer status post chemotherapy who presents for surveillance and review of recent Signatera blood test and  CT scan results.  He is asymptomatic with no pain, rectal bleeding, or changes in bowel habits. Recent Signatera blood test was positive. Follow-up CT imaging showed no definitive evidence of recurrence.  He has persistent peripheral neuropathy in his feet described as numbness and tightness attributed to prior chemotherapy. He has no limitations in hand function, ambulation, or self-care. He takes gabapentin  for neuropathy and requested a refill today.  He has emphysema from prior tobacco use but no new pulmonary symptoms.     REVIEW OF SYSTEMS:   Constitutional: Denies fevers, chills or abnormal weight loss Eyes: Denies blurriness of vision Ears, nose, mouth, throat, and face: Denies mucositis or sore throat Respiratory: Denies cough, dyspnea or wheezes Cardiovascular: Denies palpitation, chest discomfort or lower extremity swelling Gastrointestinal:  Denies nausea, heartburn or change in bowel habits Skin: Denies abnormal skin rashes Lymphatics: Denies new lymphadenopathy or easy bruising Neurological:Denies numbness, tingling or new weaknesses Behavioral/Psych: Mood is stable, no new changes  All other systems were reviewed with the patient and are negative.  MEDICAL HISTORY:  Past Medical History:  Diagnosis Date   Colon cancer (HCC)    Diabetes mellitus without complication (HCC) type 2    GERD (gastroesophageal reflux disease)    Hypertension     SURGICAL HISTORY: Past Surgical History:  Procedure Laterality Date   COLONOSCOPY     FLEXIBLE SIGMOIDOSCOPY N/A 08/15/2023   Procedure: FLEXIBLE SIGMOIDOSCOPY;  Surgeon: Teresa Lonni HERO, MD;  Location: [facility];  Service: General;  Laterality: N/A;   IR RADIOLOGIST EVAL & MGMT  06/03/2023   LAPAROSCOPY N/A 08/15/2023   Procedure: LAPAROSCOPY DIAGNOSTIC;  Surgeon: Teresa Lonni  M, MD;  Location: [facility];  Service: General;  Laterality: N/A;   PORTACATH PLACEMENT Right 09/09/2023   Procedure: INSERTION PORT-A-CATH  ULTRASOUND FLUOROSCOPY;  Surgeon: Teresa Lonni HERO, MD;  Location: [facility];  Service: General;  Laterality: Right;   XI ROBOTIC ASSISTED LOWER ANTERIOR RESECTION N/A 08/15/2023   Procedure: XI ROBOTIC ASSISTED LOWER ANTERIOR RESECTION, SMALL BOWEL RESECTION, LYSIS OF ADHESIONS, AND INTRAOPERATIVE ASSESSMENT OF PERFUSION USING ICG DYE;  Surgeon: Teresa Lonni HERO, MD;  Location: [facility];  Service: General;  Laterality: N/A;    I have reviewed the social history and family history with the patient and they are unchanged from previous note.  ALLERGIES:  is allergic to codeine.  MEDICATIONS:  Current Outpatient Medications  Medication Sig Dispense Refill   amLODipine  (NORVASC ) 10 MG tablet Take 1 tablet by mouth once daily 90 tablet 0   diclofenac  Sodium (VOLTAREN ) 1 % GEL [employer] Patient: Apply 0.5 grams (1 fingertip) to each hand and each foot twice daily for up to 12 weeks 400 g 0   gabapentin  (NEURONTIN ) 100 MG capsule TAKE 2 CAPSULE BY MOUTH TWICE DAILY 120 capsule 5   lidocaine -prilocaine  (EMLA ) cream Apply to affected area once 30 g 3   metFORMIN  (GLUCOPHAGE ) 500 MG tablet TAKE 1 TABLET BY MOUTH TWICE DAILY WITH A MEAL 180 tablet 0   ondansetron  (ZOFRAN ) 8 MG tablet Take 1 tablet (8 mg total) by mouth every 8 (eight) hours as needed for nausea or vomiting. Start on the third day after chemotherapy. 30 tablet 1   pravastatin  (PRAVACHOL ) 20 MG tablet Take 1 tablet by mouth once daily 90 tablet 0   prochlorperazine  (COMPAZINE ) 10 MG tablet Take 1 tablet (10 mg total) by mouth every 6 (six) hours as needed for nausea or vomiting. 30 tablet 1   No current facility-administered medications for this visit.    PHYSICAL EXAMINATION: Not performed   LABORATORY DATA:  I have reviewed the data as listed    Latest Ref Rng & Units 09/05/2024   12:50 PM 08/06/2024   12:35 PM 06/25/2024   12:56 PM  CBC  WBC 4.0 - 10.5 K/uL 5.6  5.6  5.4   Hemoglobin 13.0 - 17.0 g/dL 86.5   86.3  86.9   Hematocrit 39.0 - 52.0 % 37.8  37.2  36.8   Platelets 150 - 400 K/uL 143  151  135         Latest Ref Rng & Units 09/05/2024   12:50 PM 08/06/2024   12:35 PM 06/25/2024   12:56 PM  CMP  Glucose 70 - 99 mg/dL 762  871  838   BUN 8 - 23 mg/dL 11  11  12    Creatinine 0.61 - 1.24 mg/dL 8.97  9.00  9.11   Sodium 135 - 145 mmol/L 138  139  139   Potassium 3.5 - 5.1 mmol/L 3.7  3.7  3.9   Chloride 98 - 111 mmol/L 107  107  107   CO2 22 - 32 mmol/L 27  28  27    Calcium 8.9 - 10.3 mg/dL 9.6  9.8  9.4   Total Protein 6.5 - 8.1 g/dL 7.3  7.6  7.1   Total Bilirubin 0.0 - 1.2 mg/dL 1.3  1.4  1.1   Alkaline Phos 38 - 126 U/L 67  72  77   AST 15 - 41 U/L 20  21  23    ALT 0 - 44 U/L 20  18  21  RADIOGRAPHIC STUDIES: I have personally reviewed the radiological images as listed and agreed with the findings in the report. No results found.     I discussed the assessment and treatment plan with the patient. The patient was provided an opportunity to ask questions and all were answered. The patient agreed with the plan and demonstrated an understanding of the instructions.   The patient was advised to call back or seek an in-person evaluation if the symptoms worsen or if the condition fails to improve as anticipated.  I provided 25 minutes of non face-to-face telephone visit time during this encounter, including review of chart and various tests results, discussions about plan of care and coordination of care plan.    Onita Mattock, MD 10/08/24

## 2024-10-08 NOTE — Progress Notes (Signed)
 Verbal order with readback from Dr Lanny for NM PET Scan for Restaging Skull base to thigh for colon cancer surveillance and Signatera results (+) for ctDNA.  PET to be done w/in the next 1-2 wks.  Order placed and Revenue Cycle Team contacted.

## 2024-10-19 ENCOUNTER — Encounter (HOSPITAL_COMMUNITY)
Admission: RE | Admit: 2024-10-19 | Discharge: 2024-10-19 | Disposition: A | Discharge status: Home / Self Care | Source: Ambulatory Visit | Attending: Hematology | Admitting: Hematology

## 2024-10-19 DIAGNOSIS — C187 Malignant neoplasm of sigmoid colon: Secondary | ICD-10-CM | POA: Insufficient documentation

## 2024-10-19 LAB — GLUCOSE, CAPILLARY: Glucose-Capillary: 116 mg/dL — ABNORMAL HIGH (ref 70–99)

## 2024-10-19 MED ORDER — FLUDEOXYGLUCOSE F - 18 (FDG) INJECTION
10.7000 | Freq: Once | INTRAVENOUS | Status: AC | PRN
  Administered 2024-10-19: 10.7 via INTRAVENOUS

## 2024-10-24 ENCOUNTER — Telehealth: Payer: Self-pay | Admitting: Nurse Practitioner

## 2024-10-24 NOTE — Telephone Encounter (Signed)
 Phone call placed to patient. Lft voicemail to call back to direct line at [phone] to discuss PET scan results and recommendations for colonoscopy and Thyroid ultrasound. Will attempt a second call later today to discuss with patient.  -Powell Lessen, NP

## 2024-10-31 ENCOUNTER — Inpatient Hospital Stay

## 2024-10-31 DIAGNOSIS — C187 Malignant neoplasm of sigmoid colon: Secondary | ICD-10-CM

## 2024-10-31 DIAGNOSIS — D500 Iron deficiency anemia secondary to blood loss (chronic): Secondary | ICD-10-CM

## 2024-10-31 DIAGNOSIS — Z79899 Other long term (current) drug therapy: Secondary | ICD-10-CM | POA: Diagnosis not present

## 2024-10-31 DIAGNOSIS — Z87891 Personal history of nicotine dependence: Secondary | ICD-10-CM | POA: Diagnosis not present

## 2024-10-31 LAB — COMPREHENSIVE METABOLIC PANEL WITH GFR
ALT: 26 U/L (ref 0–44)
AST: 25 U/L (ref 15–41)
Albumin: 4.3 g/dL (ref 3.5–5.0)
Alkaline Phosphatase: 85 U/L (ref 38–126)
Anion gap: 9 (ref 5–15)
BUN: 11 mg/dL (ref 8–23)
CO2: 25 mmol/L (ref 22–32)
Calcium: 9.4 mg/dL (ref 8.9–10.3)
Chloride: 106 mmol/L (ref 98–111)
Creatinine, Ser: 1.04 mg/dL (ref 0.61–1.24)
GFR, Estimated: >60 mL/min
Glucose, Bld: 167 mg/dL — ABNORMAL HIGH (ref 70–99)
Potassium: 4 mmol/L (ref 3.5–5.1)
Sodium: 139 mmol/L (ref 135–145)
Total Bilirubin: 1.4 mg/dL — ABNORMAL HIGH (ref 0.0–1.2)
Total Protein: 7.8 g/dL (ref 6.5–8.1)

## 2024-10-31 LAB — CBC WITH DIFFERENTIAL/PLATELET
Abs Immature Granulocytes: 0.05 K/uL (ref 0.00–0.07)
Basophils Absolute: 0 K/uL (ref 0.0–0.1)
Basophils Relative: 1 %
Eosinophils Absolute: 0.1 K/uL (ref 0.0–0.5)
Eosinophils Relative: 2 %
HCT: 41.1 % (ref 39.0–52.0)
Hemoglobin: 14.5 g/dL (ref 13.0–17.0)
Immature Granulocytes: 1 %
Lymphocytes Relative: 29 %
Lymphs Abs: 1.8 K/uL (ref 0.7–4.0)
MCH: 30 pg (ref 26.0–34.0)
MCHC: 35.3 g/dL (ref 30.0–36.0)
MCV: 84.9 fL (ref 80.0–100.0)
Monocytes Absolute: 0.4 K/uL (ref 0.1–1.0)
Monocytes Relative: 7 %
Neutro Abs: 3.9 K/uL (ref 1.7–7.7)
Neutrophils Relative %: 60 %
Platelets: 150 K/uL (ref 150–400)
RBC: 4.84 MIL/uL (ref 4.22–5.81)
RDW: 13.2 % (ref 11.5–15.5)
WBC: 6.3 K/uL (ref 4.0–10.5)
nRBC: 0 % (ref 0.0–0.2)

## 2024-10-31 LAB — FERRITIN: Ferritin: 45 ng/mL (ref 24–336)

## 2024-10-31 LAB — CEA (ACCESS): CEA (CHCC): 3.35 ng/mL (ref 0.00–5.00)

## 2024-11-02 ENCOUNTER — Other Ambulatory Visit (INDEPENDENT_AMBULATORY_CARE_PROVIDER_SITE_OTHER): Payer: Self-pay | Admitting: Primary Care

## 2024-11-02 DIAGNOSIS — Z760 Encounter for issue of repeat prescription: Secondary | ICD-10-CM

## 2024-11-02 DIAGNOSIS — I10 Essential (primary) hypertension: Secondary | ICD-10-CM

## 2024-11-02 DIAGNOSIS — E119 Type 2 diabetes mellitus without complications: Secondary | ICD-10-CM

## 2024-11-02 NOTE — Telephone Encounter (Signed)
 Requested Prescriptions  Pending Prescriptions Disp Refills   amLODipine  (NORVASC ) 10 MG tablet [Pharmacy Med Name: amLODIPine  Besylate 10 MG Oral Tablet] 90 tablet 1    Sig: Take 1 tablet by mouth once daily     Cardiovascular: Calcium Channel Blockers 2 Passed - 11/02/2024  2:24 PM      Passed - Last BP in normal range    BP Readings from Last 1 Encounters:  08/23/24 134/78         Passed - Last Heart Rate in normal range    Pulse Readings from Last 1 Encounters:  08/23/24 73         Passed - Valid encounter within last 6 months    Recent Outpatient Visits           2 months ago Type 2 diabetes mellitus without complication, without long-term current use of insulin  (HCC)   [place] [facility] Celestia Rosaline SQUIBB, NP   1 year ago Essential hypertension   [place] [facility] Celestia Rosaline SQUIBB, NP   1 year ago Essential hypertension   [place] [facility] Celestia Rosaline SQUIBB, NP   2 years ago Type 2 diabetes mellitus without complication, without long-term current use of insulin  (HCC)   [place] [facility] Celestia Rosaline SQUIBB, NP   2 years ago Essential hypertension   [place] [facility] Celestia Rosaline SQUIBB, NP               metFORMIN  (GLUCOPHAGE ) 500 MG tablet [Pharmacy Med Name: metFORMIN  HCl 500 MG Oral Tablet] 180 tablet 1    Sig: TAKE 1 TABLET BY MOUTH TWICE DAILY WITH A MEAL     Endocrinology:  Diabetes - Biguanides Failed - 11/02/2024  2:24 PM      Failed - B12 Level in normal range and within 720 days    No results found for: VITAMINB12       Passed - Cr in normal range and within 360 days    Creatinine  Date Value Ref Range Status  04/16/2024 0.93 0.61 - 1.24 mg/dL Final   Creatinine, Ser  Date Value Ref Range Status  10/31/2024 1.04 0.61 - 1.24 mg/dL Final         Passed - HBA1C is between 0 and 7.9 and within 180 days    HbA1c, POC (controlled diabetic  range)  Date Value Ref Range Status  09/07/2022 6.3 0.0 - 7.0 % Final   Hgb A1c MFr Bld  Date Value Ref Range Status  08/23/2024 6.2 (H) 4.8 - 5.6 % Final    Comment:             Prediabetes: 5.7 - 6.4          Diabetes: >6.4          Glycemic control for adults with diabetes: <7.0          Passed - eGFR in normal range and within 360 days    GFR, Estimated  Date Value Ref Range Status  10/31/2024 >60 >60 mL/min Final    Comment:    (NOTE) Calculated using the CKD-EPI Creatinine Equation (2021)   04/16/2024 >60 >60 mL/min Final    Comment:    (NOTE) Calculated using the CKD-EPI Creatinine Equation (2021)    eGFR  Date Value Ref Range Status  06/13/2023 97 >59 mL/min/1.73 Final         Passed - Valid encounter within last 6 months  Recent Outpatient Visits           2 months ago Type 2 diabetes mellitus without complication, without long-term current use of insulin  (HCC)   [place] [facility] Celestia Rosaline SQUIBB, NP   1 year ago Essential hypertension   [place] [facility] Celestia Rosaline SQUIBB, NP   1 year ago Essential hypertension   [place] [facility] Celestia Rosaline SQUIBB, NP   2 years ago Type 2 diabetes mellitus without complication, without long-term current use of insulin  (HCC)   [place] [facility] Celestia Rosaline SQUIBB, NP   2 years ago Essential hypertension   [place] [facility] Celestia Rosaline SQUIBB, NP              Passed - CBC within normal limits and completed in the last 12 months    WBC  Date Value Ref Range Status  10/31/2024 6.3 4.0 - 10.5 K/uL Final   RBC  Date Value Ref Range Status  10/31/2024 4.84 4.22 - 5.81 MIL/uL Final   Hemoglobin  Date Value Ref Range Status  10/31/2024 14.5 13.0 - 17.0 g/dL Final  93/83/7974 87.3 (L) 13.0 - 17.0 g/dL Final  97/77/7975 86.2 13.0 - 17.7 g/dL Final   HCT  Date Value Ref Range Status  10/31/2024  41.1 39.0 - 52.0 % Final   Hematocrit  Date Value Ref Range Status  12/23/2022 42.1 37.5 - 51.0 % Final   MCHC  Date Value Ref Range Status  10/31/2024 35.3 30.0 - 36.0 g/dL Final   [facility]  Date Value Ref Range Status  10/31/2024 30.0 26.0 - 34.0 pg Final   MCV  Date Value Ref Range Status  10/31/2024 84.9 80.0 - 100.0 fL Final  12/23/2022 86 79 - 97 fL Final   No results found for: PLTCOUNTKUC, LABPLAT, POCPLA RDW  Date Value Ref Range Status  10/31/2024 13.2 11.5 - 15.5 % Final  12/23/2022 16.3 (H) 11.6 - 15.4 % Final

## 2024-11-06 NOTE — Assessment & Plan Note (Signed)
 pT4bN0M0, MMR normal, G2 -Patient presented with perforated sigmoid colon and a contained abscess required IR drainage tube placement. -He underwent left hemicolectomy and partial small bowel resection.  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  All surgical margins were negative. -Due to the high risk of recurrence given T4b lesion and bowel perforation, I recommend adjuvant chemotherapy FOLFOX or CAPOX for 6 months if he can tolerate. He opted CAPOX and started on 09/12/2023 and completed in April 2025. -Surveillance CT scan on January 09, 2024,  05/07/2024 and 10/02/2024 were negative for recurrence.

## 2024-11-06 NOTE — Progress Notes (Signed)
 " Patient Care Team: Celestia Rosaline SQUIBB, NP as PCP - General (Internal Medicine) Lanny Callander, MD as Consulting Physician (Hematology and Oncology)  Clinic Day:  11/07/2024  Referring physician: Celestia Rosaline SQUIBB, NP  ASSESSMENT & PLAN:   Assessment & Plan: Cancer of sigmoid colon (HCC) pT4bN0M0, MMR normal, G2 -Patient presented with perforated sigmoid colon and a contained abscess required IR drainage tube placement. -He underwent left hemicolectomy and partial small bowel resection.  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  All surgical margins were negative. -Due to the high risk of recurrence given T4b lesion and bowel perforation, I recommend adjuvant chemotherapy FOLFOX or CAPOX for 6 months if he can tolerate. He opted CAPOX and started on 09/12/2023 and completed in April 2025. -Surveillance CT scan on January 09, 2024,  05/07/2024 and 10/02/2024 were negative for recurrence.    Peripheral neuropathy Patient does have baseline neuropathy which started during chemotherapy.  Controlled with gabapentin .  Will continue without changes.  Thyroid nodule Most recent PET scan did show hypermetabolic thyroid nodule.  Ultrasound of thyroid was recommended.  Will order this with her today's visit.  Will follow-up with patient 1 to 2 weeks following ultrasound to review results.  Sigmoid colon cancer PET scan performed on 10/20/2023.  Scan showed no evidence of recurrence or metastatic disease.  There is an area of hypermetabolism within the proximal sigmoid, just proximal to the surgical site. Most likely physiologic.  May consider follow-up colonoscopy.there was also focal hypermetabolism in the right lobe of the thyroid with possible correlate hypoattenuating lesion.  CEA normal at 3.35.  Will repeat imaging in the next 2 to 3 months along with Signatera for ctDNA.   Plan Labs reviewed. - CBC and CMP are  unremarkable (mild elevation of T. bili at 1.4 ).  This is baseline for patient. -Ferritin good at 45. - CEA normal at 3.35. Reviewed PET scan results showing no evidence of metastatic disease in the chest, abdomen and pelvis. Will order thyroid ultrasound for evaluation of hypermetabolic thyroid nodules. Repeat CT CAP in 2 to 3 months. Continue with port flush appointments monthly.  Signatera testing to be done with next appointment for port flush. Phone visit scheduled 1 to 2 weeks after thyroid ultrasound.  The patient understands the plans discussed today and is in agreement with them.  He knows to contact our office if he develops concerns prior to his next appointment.  I provided 30 minutes of face-to-face time during this encounter and > 50% was spent counseling as documented under my assessment and plan.    Powell FORBES Lessen, NP  [facility] - A DEPT [facility] [address] [place] [address] Dept: [phone] Dept Fax: [phone]   Orders Placed This Encounter  Procedures   US  THYROID    Standing Status:   Future    Expected Date:   12/02/2024    Expiration Date:   11/25/2025    Reason for Exam (SYMPTOM  OR DIAGNOSIS REQUIRED):   hypermetabolic thyroid nodule on most recent PET scan    Preferred imaging location?:   [facility]      CHIEF COMPLAINT:  CC: Cancer of the sigmoid colon  Current Treatment: Surveillance  INTERVAL HISTORY:  Palmer is here today for repeat clinical assessment.  Last saw Dr. Lanny on 10/08/2024.  CT scan  was negative for recurrence or metastatic disease.  Signatera testing continue to be positive.  PET scan recommended.  This was done on 10/19/2024.  The results showed no evidence of recurrence or metastatic disease.  There is an area of hypermetabolism within the proximal sigmoid, just proximal to the surgical site. Most likely physiologic.  May consider follow-up  colonoscopy.there was also focal hypermetabolism in the right lobe of the thyroid with possible correlate hypoattenuating lesion.  Further evaluation with thyroid ultrasound was recommended.  He denies any new concerns or symptoms.  He does have baseline peripheral neuropathy.  This controlled with gabapentin .  He denies presence of blood in his stool, hematemesis, or other unusual bleeding.  He denies chest pain, chest pressure, or shortness of breath. He denies headaches or visual disturbances. He denies abdominal pain, nausea, vomiting, or changes in bowel or bladder habits.  denies fevers or chills. He denies pain. His appetite is good. His weight has increased 8 pounds over last 6 months.  I have reviewed the past medical history, past surgical history, social history and family history with the patient and they are unchanged from previous note.  ALLERGIES:  is allergic to codeine.  MEDICATIONS:  Current Outpatient Medications  Medication Sig Dispense Refill   amLODipine  (NORVASC ) 10 MG tablet Take 1 tablet by mouth once daily 90 tablet 1   diclofenac  Sodium (VOLTAREN ) 1 % GEL [employer] Patient: Apply 0.5 grams (1 fingertip) to each hand and each foot twice daily for up to 12 weeks 400 g 0   gabapentin  (NEURONTIN ) 100 MG capsule TAKE 2 CAPSULE BY MOUTH TWICE DAILY 120 capsule 5   lidocaine -prilocaine  (EMLA ) cream Apply to affected area once 30 g 3   metFORMIN  (GLUCOPHAGE ) 500 MG tablet TAKE 1 TABLET BY MOUTH TWICE DAILY WITH A MEAL 180 tablet 1   ondansetron  (ZOFRAN ) 8 MG tablet Take 1 tablet (8 mg total) by mouth every 8 (eight) hours as needed for nausea or vomiting. Start on the third day after chemotherapy. 30 tablet 1   pravastatin  (PRAVACHOL ) 20 MG tablet Take 1 tablet by mouth once daily 90 tablet 0   prochlorperazine  (COMPAZINE ) 10 MG tablet Take 1 tablet (10 mg total) by mouth every 6 (six) hours as needed for nausea or vomiting. 30 tablet 1   No current facility-administered  medications for this visit.    HISTORY OF PRESENT ILLNESS:   Oncology History  Cancer of sigmoid colon (HCC)  05/21/2023 Imaging   CT ABDOMEN PELVIS W CONTRAST   IMPRESSION: 1. Sigmoid colon bowel wall thickening may reflect a colonic mass or focal diverticulitis. Multiple rim enhancing fluid collections in the abdomen or pelvis likely reflect abscesses and suggest bowel perforation due to the colonic mass/focal diverticulitis. 2. Multiple loops of dilated small bowel with a possible transition to decompressed small bowel in the lower right abdomen. This may reflect ileus or small-bowel obstruction and is likely related to the multiple abdominal abscesses/intraperitoneal inflammatory process.   06/03/2023 Imaging   CT ABDOMEN PELVIS W CONTRAST    IMPRESSION: 1. Interval placement of midline anterior and left transgluteal approach pelvic drains. The abscesses have resolved. 2. Masslike thickening of the sigmoid colon remains highly suspicious for primary neoplasm. 3. Soft tissue nodular implant in the right lower quadrant measuring 1.5 cm is suspicious for metastatic implant.     06/08/2023 Pathology Results   Diagnosis 1. Colon, polyp(s), transverse and ascending (3) - TUBULAR ADENOMA, FRAGMENTS. 2. Colon, polyp(s), sigmoid and descending (2) - TUBULOVILLOUS/TUBULAR  ADENOMA, FRAGMENTS. 3. 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 NOTE: IT IS NOTED THAT THIS IS SUBMITTED AS A MASS. WHILE THERE IS FOCAL SPLAYING OF SMOOTH MUSCLE DEFINITIVE EVIDENCE OF INVASION INCLUDING DESMOPLASIA OR THE PRESENCE OF SUBMUCOSAL TISSUE IS NOT PRESENT IN THESE SUPERFICIAL BIOPSIES. CLINICAL/ENDOSCOPIC CORRELATION NECESSARY. 4. Rectum, polyp(s), (2) - TUBULOVILLOUS ADENOMA, FRAGMENTS.   06/30/2023 PET scan   NM PET IMAGE initial (PI) SKULL BASE TO THIGH  IMPRESSION: 1. Hypermetabolic mass in the proximal sigmoid colon consistent  with primary colorectal carcinoma. 2. No evidence of metastatic adenopathy or peritoneal metastasis. No liver metastasis or distant metastasis   07/10/2023 Initial Diagnosis   Cancer of sigmoid colon [facility])    Imaging     08/15/2023 Cancer Staging   Staging form: Colon and Rectum, AJCC 8th Edition - Pathologic stage from 08/15/2023: Stage IIC (pT4b, pN0, cM0) - Signed by Lanny Callander, MD on 08/30/2023 Total positive nodes: 0 Histologic grading system: 4 grade system Histologic grade (G): G2 Residual tumor (R): R0 - None   09/12/2023 - 02/06/2024 Chemotherapy   Patient is on Treatment Plan : COLORECTAL Xelox (Capeox)(130/850) q21d         REVIEW OF SYSTEMS:   Constitutional: Denies fevers, chills or abnormal weight loss Eyes: Denies blurriness of vision Ears, nose, mouth, throat, and face: Denies mucositis or sore throat Respiratory: Denies cough, dyspnea or wheezes Cardiovascular: Denies palpitation, chest discomfort or lower extremity swelling Gastrointestinal:  Denies nausea, heartburn or change in bowel habits Skin: Denies abnormal skin rashes Lymphatics: Denies new lymphadenopathy or easy bruising Neurological:Denies numbness, tingling or new weaknesses Behavioral/Psych: Mood is stable, no new changes  All other systems were reviewed with the patient and are negative.   VITALS:   Today's Vitals   11/07/24 1306 11/07/24 1307  BP: 128/78   Pulse: 79   Resp: 17   Temp: 98.6 F (37 C)   SpO2: 100%   Weight: 225 lb 11.2 oz (102.4 kg)   PainSc:  0-No pain   Body mass index is 28.98 kg/m.   Wt Readings from Last 3 Encounters:  11/07/24 225 lb 11.2 oz (102.4 kg)  08/23/24 217 lb 6.4 oz (98.6 kg)  08/06/24 219 lb (99.3 kg)    Body mass index is 28.98 kg/m.  Performance status (ECOG): 0 - Asymptomatic  PHYSICAL EXAM:   GENERAL:alert, no distress and comfortable SKIN: skin color, texture, turgor are normal, no rashes or significant lesions EYES: normal,  Conjunctiva are pink and non-injected, sclera clear OROPHARYNX:no exudate, no erythema and lips, buccal mucosa, and tongue normal  NECK: supple, thyroid normal size, non-tender, without nodularity LYMPH:  no palpable lymphadenopathy in the cervical, axillary or inguinal LUNGS: clear to auscultation and percussion with normal breathing effort HEART: regular rate & rhythm and no murmurs and no lower extremity edema ABDOMEN:abdomen soft, non-tender and normal bowel sounds Musculoskeletal:no cyanosis of digits and no clubbing  NEURO: alert & oriented x 3 with fluent speech, no focal motor/sensory deficits  LABORATORY DATA:  I have reviewed the data as listed    Component Value Date/Time   NA 139 10/31/2024 1100   NA 141 06/13/2023 1440   K 4.0 10/31/2024 1100   CL 106 10/31/2024 1100   CO2 25 10/31/2024 1100   GLUCOSE 167 (H) 10/31/2024 1100   BUN 11 10/31/2024 1100   BUN 6 (L) 06/13/2023 1440   CREATININE 1.04 10/31/2024 1100   CREATININE 0.93 04/16/2024 1256   CALCIUM  9.4 10/31/2024 1100   PROT 7.8 10/31/2024 1100   PROT 6.8 06/13/2023 1440   ALBUMIN 4.3 10/31/2024 1100   ALBUMIN 3.8 (L) 06/13/2023 1440   AST 25 10/31/2024 1100   AST 27 04/16/2024 1256   ALT 26 10/31/2024 1100   ALT 22 04/16/2024 1256   ALKPHOS 85 10/31/2024 1100   BILITOT 1.4 (H) 10/31/2024 1100   BILITOT 1.3 (H) 04/16/2024 1256   GFRNONAA >60 10/31/2024 1100   GFRNONAA >60 04/16/2024 1256     Lab Results  Component Value Date   WBC 6.3 10/31/2024   NEUTROABS 3.9 10/31/2024   HGB 14.5 10/31/2024   HCT 41.1 10/31/2024   MCV 84.9 10/31/2024   PLT 150 10/31/2024     "

## 2024-11-07 ENCOUNTER — Inpatient Hospital Stay: Attending: Hematology | Admitting: Nurse Practitioner

## 2024-11-07 VITALS — BP 128/78 | HR 79 | Temp 98.6°F | Resp 17 | Wt 225.7 lb

## 2024-11-07 DIAGNOSIS — G629 Polyneuropathy, unspecified: Secondary | ICD-10-CM | POA: Diagnosis not present

## 2024-11-07 DIAGNOSIS — Z79899 Other long term (current) drug therapy: Secondary | ICD-10-CM | POA: Diagnosis not present

## 2024-11-07 DIAGNOSIS — E041 Nontoxic single thyroid nodule: Secondary | ICD-10-CM | POA: Diagnosis not present

## 2024-11-07 DIAGNOSIS — C187 Malignant neoplasm of sigmoid colon: Secondary | ICD-10-CM | POA: Insufficient documentation

## 2024-11-25 ENCOUNTER — Encounter: Payer: Self-pay | Admitting: Hematology

## 2024-11-25 ENCOUNTER — Encounter: Payer: Self-pay | Admitting: Nurse Practitioner

## 2024-12-07 ENCOUNTER — Other Ambulatory Visit: Payer: Self-pay

## 2024-12-07 NOTE — Progress Notes (Signed)
 As per Dr. Lanny, order placed in portal for Signatera, kit taken to lab to be drawn on 02/09.

## 2024-12-10 ENCOUNTER — Inpatient Hospital Stay: Attending: Hematology

## 2024-12-17 ENCOUNTER — Inpatient Hospital Stay: Admitting: Nurse Practitioner

## 2024-12-24 ENCOUNTER — Other Ambulatory Visit
